# Patient Record
Sex: Female | Born: 1980 | Race: White | Hispanic: No | Marital: Married | Smoking: Never smoker
Health system: Southern US, Community
[De-identification: ages and names within clinical notes are randomized; demographics above are authoritative.]

## PROBLEM LIST (undated history)

## (undated) DIAGNOSIS — E282 Polycystic ovarian syndrome: Secondary | ICD-10-CM

## (undated) DIAGNOSIS — K219 Gastro-esophageal reflux disease without esophagitis: Secondary | ICD-10-CM

## (undated) DIAGNOSIS — T8859XA Other complications of anesthesia, initial encounter: Secondary | ICD-10-CM

## (undated) DIAGNOSIS — N200 Calculus of kidney: Secondary | ICD-10-CM

## (undated) DIAGNOSIS — T4145XA Adverse effect of unspecified anesthetic, initial encounter: Secondary | ICD-10-CM

## (undated) DIAGNOSIS — R000 Tachycardia, unspecified: Secondary | ICD-10-CM

## (undated) DIAGNOSIS — I4729 Other ventricular tachycardia: Secondary | ICD-10-CM

## (undated) HISTORY — PX: WISDOM TOOTH EXTRACTION: SHX21

## (undated) HISTORY — DX: Tachycardia, unspecified: R00.0

## (undated) HISTORY — DX: Calculus of kidney: N20.0

## (undated) HISTORY — DX: Polycystic ovarian syndrome: E28.2

---

## 2001-02-14 ENCOUNTER — Other Ambulatory Visit: Admission: RE | Admit: 2001-02-14 | Discharge: 2001-02-14 | Payer: Self-pay | Admitting: Family Medicine

## 2005-12-03 ENCOUNTER — Ambulatory Visit: Payer: Self-pay | Admitting: Gynecology

## 2005-12-27 ENCOUNTER — Ambulatory Visit: Payer: Self-pay | Admitting: Cardiovascular Disease

## 2005-12-27 ENCOUNTER — Ambulatory Visit (HOSPITAL_COMMUNITY): Admission: RE | Admit: 2005-12-27 | Discharge: 2005-12-27 | Payer: Self-pay | Admitting: Cardiovascular Disease

## 2006-01-04 ENCOUNTER — Ambulatory Visit: Payer: Self-pay | Admitting: Internal Medicine

## 2006-01-21 ENCOUNTER — Ambulatory Visit: Payer: Self-pay | Admitting: Gynecology

## 2006-03-04 ENCOUNTER — Encounter (INDEPENDENT_AMBULATORY_CARE_PROVIDER_SITE_OTHER): Payer: Self-pay | Admitting: Gynecology

## 2006-03-04 ENCOUNTER — Ambulatory Visit: Payer: Self-pay | Admitting: Obstetrics & Gynecology

## 2006-03-04 ENCOUNTER — Ambulatory Visit: Payer: Self-pay | Admitting: Gynecology

## 2006-03-18 ENCOUNTER — Ambulatory Visit: Payer: Self-pay | Admitting: Gynecology

## 2006-04-26 ENCOUNTER — Ambulatory Visit: Payer: Self-pay | Admitting: Gynecology

## 2006-05-03 ENCOUNTER — Ambulatory Visit: Payer: Self-pay | Admitting: Gynecology

## 2006-05-21 ENCOUNTER — Ambulatory Visit (HOSPITAL_COMMUNITY): Admission: RE | Admit: 2006-05-21 | Discharge: 2006-05-21 | Payer: Self-pay | Admitting: Gynecology

## 2006-05-30 ENCOUNTER — Ambulatory Visit: Payer: Self-pay | Admitting: *Deleted

## 2006-05-30 ENCOUNTER — Encounter: Payer: Self-pay | Admitting: Vascular Surgery

## 2006-05-30 ENCOUNTER — Inpatient Hospital Stay (HOSPITAL_COMMUNITY): Admission: AD | Admit: 2006-05-30 | Discharge: 2006-05-30 | Payer: Self-pay | Admitting: Obstetrics and Gynecology

## 2006-06-03 ENCOUNTER — Encounter (INDEPENDENT_AMBULATORY_CARE_PROVIDER_SITE_OTHER): Payer: Self-pay | Admitting: Gynecology

## 2006-06-03 ENCOUNTER — Ambulatory Visit: Payer: Self-pay | Admitting: Gynecology

## 2006-06-19 ENCOUNTER — Ambulatory Visit (HOSPITAL_COMMUNITY): Admission: RE | Admit: 2006-06-19 | Discharge: 2006-06-19 | Payer: Self-pay | Admitting: Gynecology

## 2006-06-19 ENCOUNTER — Ambulatory Visit: Payer: Self-pay | Admitting: Internal Medicine

## 2006-07-01 ENCOUNTER — Ambulatory Visit: Payer: Self-pay | Admitting: Gynecology

## 2006-07-11 ENCOUNTER — Ambulatory Visit (HOSPITAL_COMMUNITY): Admission: RE | Admit: 2006-07-11 | Discharge: 2006-07-11 | Payer: Self-pay | Admitting: Gynecology

## 2006-07-24 ENCOUNTER — Ambulatory Visit (HOSPITAL_COMMUNITY): Admission: RE | Admit: 2006-07-24 | Discharge: 2006-07-24 | Payer: Self-pay | Admitting: Gynecology

## 2006-07-29 ENCOUNTER — Ambulatory Visit: Payer: Self-pay | Admitting: Internal Medicine

## 2006-07-29 ENCOUNTER — Ambulatory Visit: Payer: Self-pay | Admitting: Gynecology

## 2006-08-12 ENCOUNTER — Ambulatory Visit: Payer: Self-pay | Admitting: Gynecology

## 2006-08-21 ENCOUNTER — Ambulatory Visit (HOSPITAL_COMMUNITY): Admission: RE | Admit: 2006-08-21 | Discharge: 2006-08-21 | Payer: Self-pay | Admitting: Gynecology

## 2006-09-09 ENCOUNTER — Ambulatory Visit: Payer: Self-pay | Admitting: Gynecology

## 2006-09-25 ENCOUNTER — Ambulatory Visit (HOSPITAL_COMMUNITY): Admission: RE | Admit: 2006-09-25 | Discharge: 2006-09-25 | Payer: Self-pay | Admitting: Gynecology

## 2006-09-30 ENCOUNTER — Ambulatory Visit: Payer: Self-pay | Admitting: Gynecology

## 2006-10-14 ENCOUNTER — Ambulatory Visit: Payer: Self-pay | Admitting: Gynecology

## 2006-10-24 ENCOUNTER — Ambulatory Visit (HOSPITAL_COMMUNITY): Admission: RE | Admit: 2006-10-24 | Discharge: 2006-10-24 | Payer: Self-pay | Admitting: Gynecology

## 2006-10-25 ENCOUNTER — Ambulatory Visit: Payer: Self-pay | Admitting: Internal Medicine

## 2006-10-28 ENCOUNTER — Ambulatory Visit: Payer: Self-pay | Admitting: Gynecology

## 2006-11-18 ENCOUNTER — Ambulatory Visit: Payer: Self-pay | Admitting: Gynecology

## 2006-11-20 ENCOUNTER — Ambulatory Visit: Payer: Self-pay | Admitting: Gynecology

## 2006-11-21 ENCOUNTER — Ambulatory Visit (HOSPITAL_COMMUNITY): Admission: RE | Admit: 2006-11-21 | Discharge: 2006-11-21 | Payer: Self-pay | Admitting: Gynecology

## 2006-11-25 ENCOUNTER — Ambulatory Visit: Payer: Self-pay | Admitting: Gynecology

## 2006-12-02 ENCOUNTER — Ambulatory Visit: Payer: Self-pay | Admitting: Obstetrics & Gynecology

## 2006-12-05 ENCOUNTER — Ambulatory Visit: Payer: Self-pay | Admitting: Obstetrics & Gynecology

## 2006-12-09 ENCOUNTER — Ambulatory Visit: Payer: Self-pay | Admitting: Gynecology

## 2006-12-12 ENCOUNTER — Ambulatory Visit (HOSPITAL_COMMUNITY): Admission: RE | Admit: 2006-12-12 | Discharge: 2006-12-12 | Payer: Self-pay | Admitting: Gynecology

## 2006-12-16 ENCOUNTER — Ambulatory Visit: Payer: Self-pay | Admitting: Gynecology

## 2006-12-19 ENCOUNTER — Ambulatory Visit: Payer: Self-pay | Admitting: Gynecology

## 2006-12-19 ENCOUNTER — Inpatient Hospital Stay (HOSPITAL_COMMUNITY): Admission: RE | Admit: 2006-12-19 | Discharge: 2006-12-22 | Payer: Self-pay | Admitting: Gynecology

## 2006-12-30 ENCOUNTER — Ambulatory Visit: Payer: Self-pay | Admitting: Gynecology

## 2007-01-27 ENCOUNTER — Ambulatory Visit: Payer: Self-pay | Admitting: Gynecology

## 2007-01-27 ENCOUNTER — Encounter (INDEPENDENT_AMBULATORY_CARE_PROVIDER_SITE_OTHER): Payer: Self-pay | Admitting: Gynecology

## 2008-04-27 ENCOUNTER — Encounter: Payer: Self-pay | Admitting: Family Medicine

## 2008-04-27 ENCOUNTER — Ambulatory Visit: Payer: Self-pay | Admitting: Family Medicine

## 2008-06-29 ENCOUNTER — Encounter: Payer: Self-pay | Admitting: Family Medicine

## 2008-06-29 ENCOUNTER — Ambulatory Visit: Payer: Self-pay | Admitting: Family Medicine

## 2009-03-21 ENCOUNTER — Ambulatory Visit: Payer: Self-pay | Admitting: Obstetrics and Gynecology

## 2009-04-14 ENCOUNTER — Ambulatory Visit: Payer: Self-pay | Admitting: Obstetrics and Gynecology

## 2009-04-14 ENCOUNTER — Ambulatory Visit (HOSPITAL_COMMUNITY): Admission: RE | Admit: 2009-04-14 | Discharge: 2009-04-14 | Payer: Self-pay | Admitting: Obstetrics and Gynecology

## 2009-04-14 LAB — CONVERTED CEMR LAB
GC Probe Amp, Genital: NEGATIVE
HCT: 41.9 % (ref 36.0–46.0)
MCHC: 31.5 g/dL (ref 30.0–36.0)
Platelets: 287 10*3/uL (ref 150–400)
RBC: 4.63 M/uL (ref 3.87–5.11)
Sed Rate: 19 mm/hr (ref 0–22)

## 2009-04-25 ENCOUNTER — Ambulatory Visit: Payer: Self-pay | Admitting: Obstetrics and Gynecology

## 2009-04-25 ENCOUNTER — Encounter: Payer: Self-pay | Admitting: Obstetrics and Gynecology

## 2009-04-28 ENCOUNTER — Ambulatory Visit: Payer: Self-pay | Admitting: Obstetrics and Gynecology

## 2009-06-24 ENCOUNTER — Ambulatory Visit (HOSPITAL_COMMUNITY): Admission: RE | Admit: 2009-06-24 | Discharge: 2009-06-24 | Payer: Self-pay | Admitting: Family Medicine

## 2009-09-26 ENCOUNTER — Ambulatory Visit: Payer: Self-pay | Admitting: Obstetrics and Gynecology

## 2009-09-26 ENCOUNTER — Inpatient Hospital Stay (HOSPITAL_COMMUNITY): Admission: AD | Admit: 2009-09-26 | Discharge: 2009-09-26 | Payer: Self-pay | Admitting: Obstetrics and Gynecology

## 2009-09-27 ENCOUNTER — Inpatient Hospital Stay (HOSPITAL_COMMUNITY): Admission: AD | Admit: 2009-09-27 | Discharge: 2009-09-27 | Payer: Self-pay | Admitting: Family Medicine

## 2010-05-16 ENCOUNTER — Ambulatory Visit: Payer: Self-pay | Admitting: Family Medicine

## 2010-06-06 ENCOUNTER — Ambulatory Visit: Payer: Self-pay | Admitting: Family Medicine

## 2010-06-26 ENCOUNTER — Ambulatory Visit: Admit: 2010-06-26 | Payer: Self-pay | Admitting: Obstetrics and Gynecology

## 2010-07-04 ENCOUNTER — Encounter: Payer: Self-pay | Admitting: Family Medicine

## 2010-07-04 ENCOUNTER — Ambulatory Visit
Admission: RE | Admit: 2010-07-04 | Discharge: 2010-07-04 | Payer: Self-pay | Source: Home / Self Care | Attending: Family Medicine | Admitting: Family Medicine

## 2010-07-04 LAB — CONVERTED CEMR LAB
AST: 15 units/L (ref 0–37)
Albumin: 4.6 g/dL (ref 3.5–5.2)
BUN: 11 mg/dL (ref 6–23)
Calcium: 9.1 mg/dL (ref 8.4–10.5)
Chloride: 100 meq/L (ref 96–112)
Creatinine, Ser: 0.68 mg/dL (ref 0.40–1.20)
Glucose, Bld: 72 mg/dL (ref 70–99)
RDW: 13.5 % (ref 11.5–15.5)
Total Bilirubin: 0.3 mg/dL (ref 0.3–1.2)
Total Protein: 7.3 g/dL (ref 6.0–8.3)
WBC: 4.7 10*3/uL (ref 4.0–10.5)

## 2010-09-06 LAB — URINALYSIS, ROUTINE W REFLEX MICROSCOPIC
Glucose, UA: 250 mg/dL — AB
Ketones, ur: 15 mg/dL — AB
Nitrite: POSITIVE — AB
Protein, ur: 100 mg/dL — AB
pH: 5 (ref 5.0–8.0)

## 2010-09-06 LAB — COMPREHENSIVE METABOLIC PANEL
AST: 18 U/L (ref 0–37)
Albumin: 4 g/dL (ref 3.5–5.2)
Alkaline Phosphatase: 51 U/L (ref 39–117)
BUN: 13 mg/dL (ref 6–23)
CO2: 26 mEq/L (ref 19–32)
Chloride: 104 mEq/L (ref 96–112)
GFR calc Af Amer: >60 mL/min (ref >60)
GFR calc non Af Amer: >60 mL/min (ref >60)
Potassium: 3.6 mEq/L (ref 3.5–5.1)

## 2010-09-06 LAB — URINE MICROSCOPIC-ADD ON

## 2010-09-06 LAB — URINE CULTURE: Colony Count: 30000

## 2010-09-06 LAB — CBC: MCHC: 34 g/dL (ref 30.0–36.0)

## 2010-10-02 ENCOUNTER — Other Ambulatory Visit: Payer: Self-pay | Admitting: Family Medicine

## 2010-10-02 ENCOUNTER — Other Ambulatory Visit: Payer: 59

## 2010-10-02 DIAGNOSIS — Z3480 Encounter for supervision of other normal pregnancy, unspecified trimester: Secondary | ICD-10-CM

## 2010-10-02 DIAGNOSIS — O3680X0 Pregnancy with inconclusive fetal viability, not applicable or unspecified: Secondary | ICD-10-CM

## 2010-10-02 LAB — HEPATITIS B SURFACE ANTIGEN: Hepatitis B Surface Ag: NEGATIVE

## 2010-10-02 LAB — ABO/RH: RH Type: POSITIVE

## 2010-10-02 LAB — RPR: RPR: NONREACTIVE

## 2010-10-06 ENCOUNTER — Ambulatory Visit (HOSPITAL_COMMUNITY)
Admission: RE | Admit: 2010-10-06 | Discharge: 2010-10-06 | Disposition: A | Discharge status: Home / Self Care | Payer: 59 | Source: Ambulatory Visit | Attending: Family Medicine | Admitting: Family Medicine

## 2010-10-06 ENCOUNTER — Ambulatory Visit (HOSPITAL_COMMUNITY): Payer: 59

## 2010-10-06 DIAGNOSIS — O3680X0 Pregnancy with inconclusive fetal viability, not applicable or unspecified: Secondary | ICD-10-CM

## 2010-10-06 DIAGNOSIS — Z3689 Encounter for other specified antenatal screening: Secondary | ICD-10-CM | POA: Insufficient documentation

## 2010-10-11 ENCOUNTER — Encounter: Payer: 59 | Admitting: Obstetrics & Gynecology

## 2010-10-11 DIAGNOSIS — Z3480 Encounter for supervision of other normal pregnancy, unspecified trimester: Secondary | ICD-10-CM

## 2010-10-19 ENCOUNTER — Encounter: Payer: 59 | Admitting: Family Medicine

## 2010-10-22 ENCOUNTER — Observation Stay (HOSPITAL_COMMUNITY)
Admission: AD | Admit: 2010-10-22 | Discharge: 2010-10-23 | Disposition: A | Discharge status: Home / Self Care | Payer: 59 | Source: Ambulatory Visit | Attending: Obstetrics & Gynecology | Admitting: Obstetrics & Gynecology

## 2010-10-22 ENCOUNTER — Inpatient Hospital Stay (HOSPITAL_COMMUNITY): Payer: 59

## 2010-10-22 DIAGNOSIS — O9989 Other specified diseases and conditions complicating pregnancy, childbirth and the puerperium: Secondary | ICD-10-CM

## 2010-10-22 DIAGNOSIS — N200 Calculus of kidney: Secondary | ICD-10-CM

## 2010-10-22 DIAGNOSIS — O99891 Other specified diseases and conditions complicating pregnancy: Principal | ICD-10-CM | POA: Insufficient documentation

## 2010-10-22 DIAGNOSIS — R1032 Left lower quadrant pain: Secondary | ICD-10-CM

## 2010-10-22 LAB — COMPREHENSIVE METABOLIC PANEL
ALT: 10 U/L (ref 0–35)
AST: 13 U/L (ref 0–37)
Albumin: 3.5 g/dL (ref 3.5–5.2)
CO2: 23 mEq/L (ref 19–32)
Calcium: 9.3 mg/dL (ref 8.4–10.5)
Creatinine, Ser: 0.69 mg/dL (ref 0.4–1.2)
GFR calc Af Amer: >60 mL/min (ref >60)
Sodium: 130 mEq/L — ABNORMAL LOW (ref 135–145)

## 2010-10-22 LAB — URINALYSIS, ROUTINE W REFLEX MICROSCOPIC
Bilirubin Urine: NEGATIVE
Glucose, UA: NEGATIVE mg/dL
Ketones, ur: NEGATIVE mg/dL
Nitrite: NEGATIVE
Specific Gravity, Urine: 1.02 (ref 1.005–1.030)
pH: 7 (ref 5.0–8.0)

## 2010-10-22 LAB — CBC
Hemoglobin: 13.4 g/dL (ref 12.0–15.0)
MCH: 29.9 pg (ref 26.0–34.0)
MCHC: 34.4 g/dL (ref 30.0–36.0)
Platelets: 278 10*3/uL (ref 150–400)
RBC: 4.48 MIL/uL (ref 3.87–5.11)

## 2010-10-22 LAB — URINE MICROSCOPIC-ADD ON

## 2010-10-23 LAB — URINE CULTURE
Colony Count: 55000
Culture  Setup Time: 201205061659

## 2010-10-27 LAB — STONE ANALYSIS: Stone Weight KSTONE: 0.023 g

## 2010-10-29 ENCOUNTER — Inpatient Hospital Stay (HOSPITAL_COMMUNITY)
Admission: AD | Admit: 2010-10-29 | Discharge: 2010-10-29 | Disposition: A | Discharge status: Home / Self Care | Payer: 59 | Source: Ambulatory Visit | Attending: Obstetrics & Gynecology | Admitting: Obstetrics & Gynecology

## 2010-10-29 DIAGNOSIS — M545 Low back pain, unspecified: Secondary | ICD-10-CM | POA: Insufficient documentation

## 2010-10-29 DIAGNOSIS — O99891 Other specified diseases and conditions complicating pregnancy: Secondary | ICD-10-CM | POA: Insufficient documentation

## 2010-10-29 LAB — URINALYSIS, ROUTINE W REFLEX MICROSCOPIC
Bilirubin Urine: NEGATIVE
Hgb urine dipstick: NEGATIVE
Ketones, ur: NEGATIVE mg/dL
Nitrite: NEGATIVE
Protein, ur: NEGATIVE mg/dL
Urobilinogen, UA: 0.2 mg/dL (ref 0.0–1.0)

## 2010-10-31 NOTE — Assessment & Plan Note (Signed)
NAME:  Tanya Meza, Tanya Meza               ACCOUNT NO.:  [id number]   MEDICAL RECORD NO.:  [id number]          PATIENT TYPE:  POB   LOCATION:  [facility] at [facility]         FACILITY:  [facility]   PHYSICIAN:  Argentina Donovan, MD        DATE OF BIRTH:  1980/08/27   DATE OF SERVICE:  04/25/2009                                  CLINIC NOTE   The patient is in for annual examination.  She was in last, April 15, 2009, with early case of pelvic inflammatory disease, which responded  very well to antibiotic therapy.  She has a history of polycystic  ovarian syndrome.  She did want to conceive the last time.  Ultrasound  that was done when she came in previously showed a slight area that may  have been a focus of hemorrhage in the one ovary to rule out dermoid or  other etiology.  They wanted to follow up in 6-8 weeks.  We are going to  comply with that.   MEDICATIONS:  She is on no medication.  I am suggesting she takes  prenatal vitamins with folic acid.   REVIEW OF SYSTEMS:  She has no complaints.  Review of systems is  virtually negative with exception of irregular periods, although she had  1 spontaneous when she was in last time.   PHYSICAL EXAMINATION:  VITAL SIGNS:  Blood pressure 118/68, weight is  162, height is 5 feet and 5 inches tall, and pulse is 75 per minute.  GENERAL:  Well-developed and well-nourished white female, in no acute  distress.  HEENT:  Within normal limits.  NECK:  Supple.  Thyroid symmetrical.  Neck is erect.  LUNGS:  Clear to auscultation and percussion.  HEART:  No murmur.  Normal sinus rhythm.  ABDOMEN:  Soft, flat, and nontender.  No masses or organomegaly.  EXTERNAL GENITALIA:  Normal.  BUS within normal limits.  Vagina is clean  and well rugated.  Cervix is clean and parous.  Uterus is anterior with  normal size, shape, and consistency.  Adnexa could not be well outlined.  EXTREMITIES:  No edema.  No varicosities.  DTRs within normal limits.   IMPRESSION:  Normal  physical examination.           ______________________________  Argentina Donovan, MD     PR/MEDQ  D:  04/25/2009  T:  04/26/2009  Job:  [id number]

## 2010-10-31 NOTE — Assessment & Plan Note (Signed)
NAME:  Tanya Meza, Tanya Meza               ACCOUNT NO.:  [id number]   MEDICAL RECORD NO.:  [id number]          PATIENT TYPE:  POB   LOCATION:  [facility] at [facility]         FACILITY:  [facility]   PHYSICIAN:  Tinnie Gens, MD        DATE OF BIRTH:  1980-12-30   DATE OF SERVICE:  07/04/2010                                  CLINIC NOTE   CHIEF COMPLAINT:  Yearly exam with Pap smear.   HISTORY OF PRESENT ILLNESS:  The patient is a 30 year old gravida 2,  para 2 who comes in today for yearly exam.  She is without significant  complaint today.  She has had PCOS.  She has regular cycles.  She got  pregnant her two previous times on Clomid and got pregnant fairly easily  with this.  She has history of preeclampsia with her first pregnancy and  her last pregnancy was uncomplicated.  She gets right ventricular  outflow tract tachycardia which she was pregnant but she has not  required medication for this a while.   PAST MEDICAL HISTORY:  Significant for PCOS and nephrolithiasis.   PAST SURGICAL HISTORY:  C-section x2, wisdom tooth extraction.   MEDICATIONS:  None.   ALLERGIES:  None known.   OBSTETRICAL HISTORY:  G2, P2 one C-section for preeclampsia and one was  an elective repeat.   GYN HISTORY:  The patient is current on nothing for birth control.  She  has no history of abnormal Pap smear.   FAMILY HISTORY:  Coronary artery disease, diabetes, thyroid disorder,  and breast cancer in maternal great aunt.  Her father has skin cancer  that is not melanoma.   SOCIAL HISTORY:  She lives with her husband and 2 children.  She works  for [employer].  She denies tobacco, alcohol, or drug use.   PHYSICAL EXAMINATION:  VITAL SIGNS:  On exam, her vitals are as noted in  the chart.  GENERAL:  She is a well-developed, well-nourished female in no acute  distress.  HEENT:  Normocephalic and atraumatic.  Sclerae anicteric.  NECK:  Supple.  Normal thyroid.  LUNGS:  Clear bilaterally.  CV:  Regular  rate and rhythm.  No rubs, gallops, or murmurs.  ABDOMEN:  Soft, nontender, and nondistended.  BREASTS:  Symmetric with everted nipples.  No masses.  No  supraclavicular or axillary adenopathy.  GU:  Normal external female genitalia.  BUS normal.  Vagina is pink and  rugated.  Cervix is nulliparous with a small polyp noted in the internal  os.  The uterus is small and in the midline.  There is no significant  tenderness.  No adnexal mass or tenderness.  EXTREMITIES:  No cyanosis, clubbing, or edema.  Distal pulses 2+.   IMPRESSION:  1. GYN exam with Pap.  2. History of polycystic ovarian syndrome.  3. Cervical polyp.  4. History of nephrolithiasis.  5. History of right ventricular outflow tract tachycardia.   PLAN:  1. Pap smear today.  2. I offered flu shot which the patient declined.  3. Blood work today to include lipid panel, CBC, CMP, and TSH.  4. I have given the patient prescription for Clomid  as she is      considering getting pregnant this year.  She will return as needed      or with pregnancy.           ______________________________  Tinnie Gens, MD     TP/MEDQ  D:  07/04/2010  T:  07/05/2010  Job:  [id number]

## 2010-10-31 NOTE — Assessment & Plan Note (Signed)
NAME:  Tanya Meza, Tanya Meza               ACCOUNT NO.:  [id number]   MEDICAL RECORD NO.:  [id number]          PATIENT TYPE:  POB   LOCATION:  [facility] at [facility]         FACILITY:  [facility]   PHYSICIAN:  Argentina Donovan, MD        DATE OF BIRTH:  24-Dec-1980   DATE OF SERVICE:  04/14/2009                                  CLINIC NOTE   The patient is a 30 year old, Caucasian female, gravida 2, para 2-0-0-2  with a history of 2 cesarean sections, youngest baby is 38 years old and  she has always had very irregular periods, had been treated with Clomid  in order to achieve pregnancy in the past, uses no birth control on a  regular basis, although she says she has been careful using condoms most  of the time.  However, she has had unprotected sex within the last 6  weeks and her last menstrual period up until the present one was about 2  months ago.  Yesterday, she began having a period and after the period  began, she began having some crampy pains followed by sharp stabbing  pains and then unrelenting severe sharp lower abdominal pain that would  have been then come back much stronger, prevented her from sleeping.  Her belly seemed extremely tender to touch.  She began having some  nausea after that, but this denied any sign of fever.   On examination, her abdomen is soft, flat, tender to deep palpation, but  no significant rebound and no guarding.  There is no CVA tenderness.  The external genitalia is normal.  BUS within normal limits.  The vagina  was clean, well rugated, and a moderate amount of burgundy blood in the  vault which she says, but the amount of bleeding is similar to most  periods that she has.  On bimanual pelvic examination, the cervix is  extremely tender.  The patient has this slightly less than her  chandelier sign and the adnexa could not be well outlined, although I  got a feeling that there was some fullness on the left side.   The plan is although a urine pregnancy test was  negative.  I am going to  get a quantitative beta-serum test, a CBC, and erythrocyte sedimentation  rate, and the ultrasound of the pelvis.  I think that this patient  probably has my impression is pelvic pain probably secondary to  salpingitis and pelvic inflammatory disease based on the fact that the  pain started after the onset of the period.  She had at least one  episode of ruptured ovarian cyst in the past that was in cycle when she  was in her period and since classically the cyst rupturing will cause  pain prior to the onset of the bleeding.  I think that it is unlikely  that ovarian cyst is the cause of this ruptured ovarian cyst as a cause  of the pain that the patient currently has.  I am going to start her on  doxycycline and give her some Percocet for pain and given appointment to  come back in a week or sooner if the pain gets worse.  Also,  the patient  had instructions to contact me after we get the results of the lab tests  and the ultrasound later today.   IMPRESSION:  Pelvic pain probably pelvic inflammatory disease.           ______________________________  Argentina Donovan, MD     PR/MEDQ  D:  04/14/2009  T:  04/15/2009  Job:  [id number]

## 2010-10-31 NOTE — Op Note (Signed)
NAME:  Tanya Meza, Tanya Meza               ACCOUNT NO.:  [id number]   MEDICAL RECORD NO.:  [id number]          PATIENT TYPE:  INP   LOCATION:  9103                          FACILITY:  [facility]   PHYSICIAN:  Ginger Carne, MD  DATE OF BIRTH:  27-Jul-1980   DATE OF PROCEDURE:  12/19/2006  DATE OF DISCHARGE:                               OPERATIVE REPORT   PREOPERATIVE DIAGNOSIS:  1. Repeat cesarean section, 39 and 0/7 weeks' gestation.  2. History of chronic hypertension.   POSTOPERATIVE DIAGNOSIS:  1. Repeat cesarean section, 39 and 0/7 weeks' gestation.  2. History of chronic hypertension.  3. Term viable delivery of female infant.   PROCEDURE:  Repeat low-transverse cesarean section   SURGEON:  Ginger Carne, MD   ASSISTANT:  None.   COMPLICATIONS:  None immediate.   ESTIMATED BLOOD LOSS:  500 mL.   SPECIMENS:  Cord bloods.   ANESTHESIA:  Spinal.   OPERATIVE FINDINGS:  Term infant, female, delivered in a vertex  presentation.  Apgars and weight her delivery room record.  No gross  abnormalities.  Baby cried spontaneously at delivery and amniotic fluid  was clear.  A 3- vessel cord.  Central insertion.  Complete placenta.  Uterus, tubes, and ovary showed normal his decidual changes of  pregnancy.   OPERATIVE PROCEDURE:  The patient prepped and draped in the usual  fashion and placed in the left supine position.  Betadine soap was used  for antiseptic in the patient was characterized prior to procedure.  After adequate spinal analgesia, a Pfannenstiel incision was made in the  abdomen open.  The lower uterine segment was in size transversely.  They  be delivered; cord clamped and cut; and the infant given to the  pediatric staff after bulb suctioning.  Placenta removed manually.  Uterus was inspected closure of the uterine musculature in one layer of  #0  Vicryl running interlocking sutures.  He missed ethically checked, blood  clots removed, closure of the fascia from  either in two midline was with  #0 Vicryl suture and 4-0 Prolene for subcuticular closure with Steri-  Strips.   The patient tolerated the procedure well and went to the post anesthesia  recovery room in excellent condition.      Ginger Carne, MD  Electronically Signed     SHB/MEDQ  D:  12/19/2006  T:  12/19/2006  Job:  [id number]

## 2010-10-31 NOTE — Assessment & Plan Note (Signed)
NAME:  Tanya Meza, Tanya Meza               ACCOUNT NO.:  [id number]   MEDICAL RECORD NO.:  [id number]          PATIENT TYPE:  POB   LOCATION:  [facility] at [facility]         FACILITY:  [facility]   PHYSICIAN:  Tinnie Gens, MD        DATE OF BIRTH:  [birth year]   DATE OF SERVICE:  06/29/2008                                  CLINIC NOTE   CHIEF COMPLAINT:  Cervical biopsy.   HISTORY OF PRESENT ILLNESS:  The patient is a 30 year old gravida 2,  para 2, has had two prior C-sections, who was seen for a yearly exam in  November.  At that time she was found to have a cervical polyp which was  deemed benign but could not be removed easily with the ring forceps, so  she is here for a biopsy just to prove that it is a benign polyp.   EXAMINATION:  On exam vitals are as noted in the chart.  She is a well-  developed, well-nourished female no acute stress.  GU:  Normal external  female genitalia.  BUS normal.  Vagina is pink and rugated.  Cervix is  nulliparous without lesions.  There is a fleshy growth in the midline of  the cervix.  Attempt was made to remove this with a ring forceps again.  However, this could not be done.   PROCEDURE:  Cervical biopsy is obtained without difficulty, bleeding.  Hemostasis was achieved with Monsel's.   IMPRESSION:  Cervical polyp most likely.  Unable to be removed, but  biopsy taken.   PLAN:  Await pathology results.  Call the patient if something is  abnormal.  Otherwise we will send her a letter stating everything is  fine.  She can follow up with a Pap smear in November of 2010.           ______________________________  Tinnie Gens, MD     TP/MEDQ  D:  06/29/2008  T:  06/29/2008  Job:  [id number]

## 2010-10-31 NOTE — Assessment & Plan Note (Signed)
NAME:  Tanya Meza, Tanya Meza               ACCOUNT NO.:  [id number]   MEDICAL RECORD NO.:  [id number]          PATIENT TYPE:  POB   LOCATION:  [facility] at [facility]         [facility]ACILITY:  [facility]   PHYSICIAN:  Tinnie Gens, MD        DATE OF BIRTH:  [birth year]   DATE OF SERVICE:  04/27/2008                                  CLINIC NOTE   CHIEF COMPLAINT:  Yearly exam.   HISTORY OF PRESENT ILLNESS:  The patient is a 30 year old gravida 2,  para 2 who has had two prior sections for preeclampsia and has  hypertension during pregnancy.  She had difficulty getting pregnant and  has to use Clomid, but has gotten pregnant easily with this.  She has a  history OF PCOS and irregular cycles.  She has had approximately three  this year.  The patient also has right ventricular outflow tract  tachycardia which has been fairly well controlled.   PAST MEDICAL HISTORY:  Significant for hypertension with pregnancy and  PCOS.   PAST SURGICAL HISTORY:  C-section x2.   MEDICATIONS:  She is on none currently.   ALLERGIES:  None known.   FAMILY HISTORY:  Coronary artery disease, diabetes, thyroid disorder,  and breast cancer in a maternal aunt.   SOCIAL HISTORY:  She lives with her husband, two children.  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   OBSTETRICAL HISTORY:  G2, P2, 1 C-section posterior section for  eclampsia, one elective repeat.   GYNECOLOGIC HISTORY:  The patient is currently on nothing for birth  control.  No significant history of abnormal Pap.   PHYSICAL EXAMINATION:  VITALS:  As noted in the chart.  GENERAL:  She is a well-developed, well-nourished female, in no acute  distress.  HEENT:  Normocephalic, atraumatic.  Sclerae anicteric.  NECK:  Supple.  Normal thyroid.  LUNGS:  Clear bilaterally.  CARDIOVASCULAR:  Regular rate and rhythm without rubs, gallops, or  murmurs.  ABDOMEN:  Soft, nontender, nondistended.  BREASTS:  Symmetric with everted nipples.  No masses.   No  supraclavicular or axillary adenopathy.  GU:  Normal external female genitalia.  BUS normal.  Vagina is pink and  rugated.  Cervix is nulliparous.  There is a small polyp noted at the  internal os.  Uterus is small maybe somewhat fixed in the midline.  No  significant tenderness.  No adnexal mass or tenderness.  EXTREMITIES:  No cyanosis, clubbing, or edema.   IMPRESSION:  1. Yearly exam.  2. Cervical polyp.   PLAN:  1. Pap smear today.  2. Follow up in 2-4 weeks for cervical biopsy.  3. Yaz for birth control.           ______________________________  Tinnie Gens, MD     TP/MEDQ  D:  04/27/2008  T:  04/28/2008  Job:  [id number]

## 2010-10-31 NOTE — Discharge Summary (Signed)
NAME:  Tanya Meza, Tanya Meza               ACCOUNT NO.:  [id number]   MEDICAL RECORD NO.:  [id number]          PATIENT TYPE:  POB   LOCATION:  [facility]                          FACILITY:  [facility]   PHYSICIAN:  Ginger Carne, MD  DATE OF BIRTH:  1980-08-04   DATE OF ADMISSION:  12/19/2006  DATE OF DISCHARGE:  12/22/2006                               DISCHARGE SUMMARY   DISCHARGE DIAGNOSES:  1. Term pregnancy, delivered.  2. Chronic hypertension.  3. Low-transverse cesarean section secondary to repeat.  4. Acute blood loss anemia.   DISCHARGE MEDICATIONS:  1. Yaz one tab daily.  2. Colace twice daily.  3. Iron 325 twice daily.  4. Percocet 5/325 q.4-6 hours p.r.n.  5. Ibuprofen 600 mg t.i.d.   PROCEDURES:  1. Low-transverse C-section secondary to repeat on December 19, 2006.  2. Ultrasound.  3. Nonstress test.   HOSPITAL COURSE:  A 30 year old G2 P1-1-0-2 at 6 0/7 weeks was admitted  for scheduled repeat low-transverse C-section.  She also had a history  of chronic hypertension.  There were no complications postop.  Estimated  blood loss less than 500 mL.  See op note for more details.  Produced a  viable healthy female.  Patient did have some mild anemia that was  asymptomatic with a hemoglobin down to 7.8 from 9.47.   FOLLOWUP:  The patient will follow up at [facility] in one week for  blood pressure check and suture removal.   DIET:  Routine.   INSTRUCTIONS:  Routine.   STATUS __________:  Patient will be discharged to home.  She will also  follow up at [facility] for a six-week followup in six weeks.      Angeline Slim, M.D.  Electronically Signed      Ginger Carne, MD  Electronically Signed    AL/MEDQ  D:  12/22/2006  T:  12/22/2006  Job:  [id number]

## 2010-10-31 NOTE — Assessment & Plan Note (Signed)
[facility]                         ELECTROPHYSIOLOGY OFFICE NOTE   Tanya Meza, Tanya Meza                      MRN:          [id number]  DATE:10/25/2006                            DOB:          28-Feb-1981    Tanya Meza returns today for followup.  She is a very pleasant 30-year-  old woman who is 51 weeks' pregnant, who was initially referred early in  her first trimester for evaluation of nonsustained VT.  The patient has  had a previous history of palpitations in the past and her additional  past medical history is notable for history of eclampsia/preeclampsia  with her initial pregnancy, requiring STAT C-section.  Since she began  on the beta blocker, she has had essentially no palpitations.  She  denies chest pain or shortness of breath.  She is being monitored very  closely and is having monthly ultrasounds.  The patient denies other  symptoms today and overall feels well.   PHYSICAL EXAMINATION:  She is a pleasant young woman who is in her third  trimester of pregnancy, but in no acute distress.  The blood pressure was 100/64.  The pulse was 72 and regular.  The  respirations were 18.  Weight was 186 pounds.  NECK:  No jugulovenous distention.  LUNGS:  Clear bilaterally to auscultation.  CARDIOVASCULAR:  Regular rate and rhythm with normal S1 and S2.  I do  not appreciate any murmurs.  EXTREMITIES:  Demonstrated trace peripheral edema bilaterally.   EKG:  Demonstrates sinus rhythm with normal axis and intervals.  There  were 3 PVCs noted.   IMPRESSION:  1. Nonsustained ventricular tachycardia and frequent premature      ventricular contractions.  2. Thirty-one-week pregnancy.  3. History of eclampsia/preeclampsia during previous pregnancy.   DISCUSSION:  At this point, Tanya Meza is doing quite well.  I have  asked that she decrease her dose of metoprolol when she gets to the 37-  or 38-week gestation period to minimize the risk of  fetal bradycardia.  I will plan to see the patient back on as-needed basis up until this  time.  Today we also counseled some about the possibility of having a  third pregnancy and I basically recommended a period of watchful waiting  before making decisions about this.     Doylene Canning. Ladona Ridgel, MD  Electronically Signed    GWT/MedQ  DD: 10/25/2006  DT: 10/26/2006  Job #: [id number]   cc:   Ginger Carne, MD  Richard A. Alanda Amass, M.D.

## 2010-11-02 ENCOUNTER — Encounter: Payer: 59 | Admitting: Obstetrics and Gynecology

## 2010-11-03 NOTE — Assessment & Plan Note (Signed)
[facility]                         ELECTROPHYSIOLOGY OFFICE NOTE   Tanya, Meza                      MRN:          [id number]  DATE:06/19/2006                            DOB:          05-26-1981    Ms. Tanya Meza returns today for a follow up visit.  She is a very  pleasant, 30 year old woman who was initially referred to me back in  July for nonsustained VT.  The patient had a left inferior axis  nonsustained VT which really was associated with palpitations, but no  frank syncope.  The patient underwent evaluation with an MRI which  demonstrated no fibrofatty infiltration and had an echo with preserved  LV function.  She notes that her EKG demonstrated of her nonsustained VT  from negative to positive between leads V3 and V5.  These were all  consistent with RV outflow tract and nonsustained VT.  Because the  patient was minimally symptomatic at that time, had no medical therapy  recommended; and was not thought to be a good candidate for catheter  ablation because of the paucity of her symptoms.  She has subsequently  become pregnant now and is at 13 weeks' gestation.  She notes that over  the last several weeks her palpitations have increased in frequency and  severity.  Despite this she notes that she still has not had any frank  syncope.  She states that she has not had palpitations every day, but on  days that she does it may happen 1-3 times per day.  Again, there has  been no frank syncope, but she is anxious by this.  She states that the  sensation is that of her heart flipping over in her chest.  She denies  chest pain or shortness of breath.   Of additional note, her past medical history is remarkable for toxemia  being present with her initial pregnancy resulting in a STAT C-section.   PHYSICAL EXAMINATION:  She is a pleasant, well-appearing, young woman in  no acute distress.  The blood pressure was 112/74, pulse 97 and regular,  respirations 18.  The weight was 161 pounds.  NECK:  Revealed no jugular venous distention.  There was no thyromegaly.  LUNGS:  Clear bilaterally.  CARDIOVASCULAR EXAM:  A regular rate and rhythm with normal S1-S2.  EXTREMITIES:  Demonstrated no clubbing, cyanosis, or edema.  Pulses were  2+ and symmetric.  NEUROLOGIC EXAM:  Nonfocal.   IMPRESSION:  1. Nonsustained ventricular tachycardia with frequent premature      ventricular contractions.  2. A 13-week gestation.  3. History of eclampsia/preeclampsia with initial gestation requiring      STAT C-section.   DISCUSSION:  The patient was counseled in detail about the likely  continued benign nature of her palpitations.  However, because they are  becoming more symptomatic, I have recommended that she be given a  prescription for Lopressor 25 mg twice a day.  If her palpitations  increase in frequency and severity, then she is instructed to start  taking this medication on a regular basis. If her blood pressure were to  gradually  increase over the next several weeks, then I would also  recommend initiation of Lopressor.  We will plan to see her  back in the office in 4-6 weeks.  She is instructed to call if her  palpitations are not treatable with beta blockers.     Doylene Canning. Ladona Ridgel, MD  Electronically Signed    GWT/MedQ  DD: 06/19/2006  DT: 06/19/2006  Job #: [id number]   cc:   Gerlene Burdock A. Alanda Amass, M.D.  Ginger Carne, MD

## 2010-11-03 NOTE — Letter (Signed)
January 04, 2006     Richard A. Alanda Amass, MD  [address], [address]  [place], [place] [address]   RE:  TRAVIS, PURK  MRN:  [id number]  /  DOB:  06-Aug-1980   Dear Luan Pulling:   Thank you for referring Mrs. Norman Clay for EP evaluation. As you know,  she is a very pleasant 30 year old woman who is otherwise healthy, but has  had documented tachy-palpitations for over 10 years. These have been  subsequently found to be due to frequent PVC's and non-sustained VT. 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 The patient has recently undergone a cardiac  MRI and has had a 2-D echocardiogram in the past, demonstrating no clear cut  RV abnormalities, including normal RV systolic function with no focal  aneurysms or segmental wall motion abnormalities. In addition, her MRI  demonstrated no evidence of fiber or fatty infiltration of the right  ventricle. She was referred today for additional evaluation. The patient has  never had syncope. She denies chest pain and is minimally symptomatic with  her PVC's. Her examination was otherwise unremarkable with the exception of  an irregular heartbeat. Her EKG demonstrates sinus rhythm with a QRS  duration of 86 milliseconds, in the setting of non-sustained VT on the  cardiac monitor. Of note, the morphology as noted above, a left bundle  inferior axis rhythm, when she has her non-sustained VT and ventricular  ectopy.   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 As she is minimally symptomatic, I think a beta blocker or  calcium blocker at the present time would not be indicated. Though might be,  should she develop symptoms, particularly if she were to develop heart  failure symptoms from this, which I think is unlikely but not impossible.  Obviously, she had  syncope or prolonged symptomatic bouts of her tachycardia  and consideration for EP evaluation would be warranted. For now, I would  recommend a period of watchful waiting, perhaps with p.r.n. beta blockers,  if she developed worsening symptoms. Because of the minimal nature of her  symptoms for now and because of her young age, I think a period of watchful  waiting would be appropriate. I will plan to see her back in several months  to see how she is doing and if she is stable at that point, have her  followup with me on a p.r.n. basis.   Once again, thanks for referring Mrs. Nace for EP evaluation.    Sincerely,      Doylene Canning. Ladona Ridgel, MD   GWT/MedQ  DD:  01/04/2006  DT:  01/05/2006  Job #:  [id number]

## 2010-11-03 NOTE — Assessment & Plan Note (Signed)
[facility]                         ELECTROPHYSIOLOGY OFFICE NOTE   Tanya Meza, Tanya Meza                      MRN:          [id number]  DATE:07/29/2006                            DOB:          1980-07-24    Tanya Meza returns today for followup.  She is a very pleasant young  woman who is [redacted] weeks gestation, who has a history of RV outflow track  VT and palpitations from this.  I saw her back in early January when she  was at that time [redacted] weeks pregnant.  Because of increasing palpitations  we gave her a prescription for metoprolol 25 twice daily and she has  been on this for now 1-2 weeks.  The patient notes that about 3 or 4  days ago she began having cough with productive sputum and was recently  diagnosed with bronchitis.  She was given a prescription for Z-Pak.  The  patient returns today for followup.  Overall, she has been stable but  does note that for the last 3 to 4 days that she has had symptoms of  bronchitis with a productive yellow sputum and cough.  She denies any  high fevers or chills.  She has had no syncope.  She denies shortness of  breath or other symptoms.  She denies peripheral edema.   PHYSICAL EXAMINATION:  On exam, she is a pleasant well-appearing young  lady in no distress.  Blood pressure was about 100/60, the pulse was 80  and regular, the respirations were 18, the weight was 165 pounds, up 11  pounds from July of 2007, up 4 pounds from a month ago.  NECK:  No jugular venous distention.  There was no thyromegaly.  The  carotids are 2+ and symmetric.  LUNGS:  Clear bilaterally to auscultation.  There are no wheezes, rales  or rhonchi.  CARDIOVASCULAR:  A regular rate and rhythm with normal S1 and S2.  There  are no premature beats today.  EXTREMITIES:  No edema.   No EKG was done today.   MEDICATIONS:  1. Multivitamin.  2. Butal.  3. Z-Pak which was just started today.  4. Lopressor 25 twice daily.   DISCUSSION:   Overall, Tanya Meza is stable.  She has had increasing  palpitations since she has been sick with her bronchitis.  I have  recommended that she continue on with her Lopressor  25 twice daily.  I will see her back in the office in 2 to 3 months,  sooner if her palpitations worsen.  Overall, I expect a continued stable  course for this pregnancy.     Doylene Canning. Ladona Ridgel, MD  Electronically Signed    GWT/MedQ  DD: 07/29/2006  DT: 07/29/2006  Job #: [id number]   cc:   Ginger Carne, MD  Pearletha Furl. Alanda Amass, M.D.

## 2010-11-03 NOTE — Assessment & Plan Note (Signed)
[facility]                           ELECTROPHYSIOLOGY OFFICE NOTE   CHRYSA, RAMPY                      MRN:          [id number]  DATE:01/04/2006                            DOB:          06/02/1981    Tanya Meza is referred today by Dr. Susa Griffins for evaluation of  palpitations and documented nonsustained VT.   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  She  states that for at least 10 years she has had rare palpitations that are not  particularly bothersome to her but have been noted on doctors' followups and  EKGs.  These are notable for a left bundle branch block, QRS morphology with  positive R waves in the inferior leads consistent with RV outflow tract VT.  Of note, the transition of this tachycardia from negative to positive is  between V3 and V5 depending on lead position.  The patient states that she  has never had syncope and there is no family history of sudden cardiac  death.  She denies other significant symptoms.   PAST MEDICAL HISTORY:  Cesarean section in 2005.   FAMILY HISTORY:  Notable for no sudden or premature cardiac death.  She has  some grandparents with coronary disease and MIs.  Her parents are alive and  well, and she has no aunts, uncles or cousins who have died suddenly.   SOCIAL HISTORY:  The patient is married.  She works as a Armed forces operational officer.  She denies tobacco or ethanol use.   REVIEW OF SYSTEMS:  Negative except as noted in the HPI.  __________ .   PHYSICAL EXAMINATION:  GENERAL:  She is a pleasant, well-appearing young  woman in no acute distress.  VITAL SIGNS:  Blood pressure 124/84, pulse 78 and regular, respirations 18.  Weight 154  pounds.  HEENT:  Normocephalic, atraumatic.  Pupils equal and round.  Oropharynx  moist.  Sclerae anicteric.  NECK:  Revealed no jugular venous distention.  There is no thyromegaly.  Trachea was midline.  Carotids were 2+ and symmetric.  CARDIOVASCULAR:  There were no murmurs, rubs or gallops present. Regular  rate and rhythm with normal S1 and S2.  PMI was not laterally displaced nor  was it enlarged.  LUNGS:  Clear bilaterally to auscultation.  There were no wheezes, rales or  rhonchi.  There is no increased work of breathing.  ABDOMEN:  Soft, nontender, nondistended.  There was no organomegaly.  Bowel  sounds were present.  No rebound or guarding.  EXTREMITIES:  Demonstrated no cyanosis, clubbing or edema.  Pulses were 2+  and symmetric.   LABORATORY DATA:  The EKG demonstrates sinus rhythm with frequent PVCs.  These PVCs are in a left bundle branch block QRS morphology with positive R  waves in the inferior leads.   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   DISCUSSION:  Because the patient is not particularly symptomatic, I have  recommended a period of watchful waiting with regard to her arrhythmias.  While there have been reported cases of worsening LV function in patients  with frequent amounts of ventricular ectopy, I think this is quite unlikely  here.  Because her MRI is normal showing no focal abnormalities and no  evidence of fatty fibril infiltration, I think it is very unlikely that the  patient has RV dysplasia but simply has RV outflow tract VT.  Should she  develop syncope or have sustained tachy-palpitations, then I would be happy  to see her back and additional evaluation would be required, but at the  present time, electrophysiologic study and testing would not be indicated in  this particular patient.                                   Doylene Canning. Ladona Ridgel, MD   GWT/MedQ  DD:  01/04/2006  DT:   01/05/2006  Job #:  [id number]   cc:   Gerlene Burdock A. Alanda Amass, MD  Ginger Carne, MD

## 2010-11-08 ENCOUNTER — Encounter: Payer: 59 | Admitting: Family Medicine

## 2010-11-08 ENCOUNTER — Encounter (INDEPENDENT_AMBULATORY_CARE_PROVIDER_SITE_OTHER): Payer: 59 | Admitting: Family Medicine

## 2010-11-08 ENCOUNTER — Other Ambulatory Visit: Payer: Self-pay | Admitting: Family Medicine

## 2010-11-08 DIAGNOSIS — Z3682 Encounter for antenatal screening for nuchal translucency: Secondary | ICD-10-CM

## 2010-11-08 DIAGNOSIS — R1084 Generalized abdominal pain: Secondary | ICD-10-CM

## 2010-11-08 DIAGNOSIS — Z3480 Encounter for supervision of other normal pregnancy, unspecified trimester: Secondary | ICD-10-CM

## 2010-11-14 ENCOUNTER — Other Ambulatory Visit: Payer: Self-pay | Admitting: Obstetrics & Gynecology

## 2010-11-14 DIAGNOSIS — K829 Disease of gallbladder, unspecified: Secondary | ICD-10-CM

## 2010-11-16 ENCOUNTER — Ambulatory Visit (HOSPITAL_COMMUNITY)
Admission: RE | Admit: 2010-11-16 | Discharge: 2010-11-16 | Disposition: A | Discharge status: Home / Self Care | Payer: 59 | Source: Ambulatory Visit | Attending: Family Medicine | Admitting: Family Medicine

## 2010-11-16 ENCOUNTER — Ambulatory Visit (HOSPITAL_COMMUNITY)
Admission: RE | Admit: 2010-11-16 | Discharge: 2010-11-16 | Disposition: A | Discharge status: Home / Self Care | Payer: 59 | Source: Ambulatory Visit | Attending: Obstetrics & Gynecology | Admitting: Obstetrics & Gynecology

## 2010-11-16 DIAGNOSIS — O3510X0 Maternal care for (suspected) chromosomal abnormality in fetus, unspecified, not applicable or unspecified: Secondary | ICD-10-CM | POA: Insufficient documentation

## 2010-11-16 DIAGNOSIS — Z3682 Encounter for antenatal screening for nuchal translucency: Secondary | ICD-10-CM

## 2010-11-16 DIAGNOSIS — K829 Disease of gallbladder, unspecified: Secondary | ICD-10-CM

## 2010-11-16 DIAGNOSIS — O351XX0 Maternal care for (suspected) chromosomal abnormality in fetus, not applicable or unspecified: Secondary | ICD-10-CM | POA: Insufficient documentation

## 2010-11-16 DIAGNOSIS — Z3689 Encounter for other specified antenatal screening: Secondary | ICD-10-CM | POA: Insufficient documentation

## 2010-11-20 ENCOUNTER — Emergency Department (HOSPITAL_COMMUNITY)
Admission: EM | Admit: 2010-11-20 | Discharge: 2010-11-20 | Disposition: A | Discharge status: Home / Self Care | Payer: 59 | Attending: Emergency Medicine | Admitting: Emergency Medicine

## 2010-11-20 DIAGNOSIS — S060X0A Concussion without loss of consciousness, initial encounter: Secondary | ICD-10-CM | POA: Insufficient documentation

## 2010-11-20 DIAGNOSIS — O99891 Other specified diseases and conditions complicating pregnancy: Secondary | ICD-10-CM | POA: Insufficient documentation

## 2010-11-20 DIAGNOSIS — R110 Nausea: Secondary | ICD-10-CM | POA: Insufficient documentation

## 2010-11-20 DIAGNOSIS — R51 Headache: Secondary | ICD-10-CM | POA: Insufficient documentation

## 2010-11-20 DIAGNOSIS — IMO0002 Reserved for concepts with insufficient information to code with codable children: Secondary | ICD-10-CM | POA: Insufficient documentation

## 2010-12-06 ENCOUNTER — Encounter (INDEPENDENT_AMBULATORY_CARE_PROVIDER_SITE_OTHER): Payer: 59 | Admitting: Family Medicine

## 2010-12-06 ENCOUNTER — Other Ambulatory Visit: Payer: Self-pay | Admitting: Family Medicine

## 2010-12-06 DIAGNOSIS — Z3689 Encounter for other specified antenatal screening: Secondary | ICD-10-CM

## 2010-12-06 DIAGNOSIS — Z3480 Encounter for supervision of other normal pregnancy, unspecified trimester: Secondary | ICD-10-CM

## 2010-12-21 ENCOUNTER — Ambulatory Visit (HOSPITAL_COMMUNITY)
Admission: RE | Admit: 2010-12-21 | Discharge: 2010-12-21 | Disposition: A | Discharge status: Home / Self Care | Payer: 59 | Source: Ambulatory Visit | Attending: Family Medicine | Admitting: Family Medicine

## 2010-12-21 ENCOUNTER — Encounter (HOSPITAL_COMMUNITY): Payer: Self-pay

## 2010-12-21 DIAGNOSIS — Z363 Encounter for antenatal screening for malformations: Secondary | ICD-10-CM | POA: Insufficient documentation

## 2010-12-21 DIAGNOSIS — Z3689 Encounter for other specified antenatal screening: Secondary | ICD-10-CM

## 2010-12-21 DIAGNOSIS — Z1389 Encounter for screening for other disorder: Secondary | ICD-10-CM | POA: Insufficient documentation

## 2010-12-21 DIAGNOSIS — O09299 Supervision of pregnancy with other poor reproductive or obstetric history, unspecified trimester: Secondary | ICD-10-CM | POA: Insufficient documentation

## 2010-12-21 DIAGNOSIS — O358XX0 Maternal care for other (suspected) fetal abnormality and damage, not applicable or unspecified: Secondary | ICD-10-CM | POA: Insufficient documentation

## 2011-01-04 ENCOUNTER — Encounter (INDEPENDENT_AMBULATORY_CARE_PROVIDER_SITE_OTHER): Payer: 59 | Admitting: Obstetrics & Gynecology

## 2011-01-04 DIAGNOSIS — O34219 Maternal care for unspecified type scar from previous cesarean delivery: Secondary | ICD-10-CM

## 2011-01-04 DIAGNOSIS — Z3480 Encounter for supervision of other normal pregnancy, unspecified trimester: Secondary | ICD-10-CM

## 2011-02-01 ENCOUNTER — Encounter: Payer: 59 | Admitting: Obstetrics & Gynecology

## 2011-02-27 ENCOUNTER — Ambulatory Visit (INDEPENDENT_AMBULATORY_CARE_PROVIDER_SITE_OTHER): Payer: 59 | Admitting: Family Medicine

## 2011-02-27 DIAGNOSIS — Z3480 Encounter for supervision of other normal pregnancy, unspecified trimester: Secondary | ICD-10-CM

## 2011-02-28 LAB — CBC WITH DIFFERENTIAL/PLATELET
Basophils Absolute: 0 10*3/uL (ref 0.0–0.1)
Eosinophils Absolute: 0.1 10*3/uL (ref 0.0–0.7)
Eosinophils Relative: 2 % (ref 0–5)
HCT: 33.7 % — ABNORMAL LOW (ref 36.0–46.0)
Lymphocytes Relative: 17 % (ref 12–46)
MCH: 29.3 pg (ref 26.0–34.0)
MCHC: 32 g/dL (ref 30.0–36.0)
MCV: 91.3 fL (ref 78.0–100.0)
Monocytes Absolute: 0.3 10*3/uL (ref 0.1–1.0)
RDW: 13.6 % (ref 11.5–15.5)
WBC: 7.8 10*3/uL (ref 4.0–10.5)

## 2011-03-13 ENCOUNTER — Encounter (INDEPENDENT_AMBULATORY_CARE_PROVIDER_SITE_OTHER): Payer: 59 | Admitting: Family Medicine

## 2011-03-13 DIAGNOSIS — Z3480 Encounter for supervision of other normal pregnancy, unspecified trimester: Secondary | ICD-10-CM

## 2011-03-13 DIAGNOSIS — O34219 Maternal care for unspecified type scar from previous cesarean delivery: Secondary | ICD-10-CM

## 2011-03-29 ENCOUNTER — Encounter: Payer: Self-pay | Admitting: Family Medicine

## 2011-03-29 ENCOUNTER — Ambulatory Visit (INDEPENDENT_AMBULATORY_CARE_PROVIDER_SITE_OTHER): Payer: 59 | Admitting: Family Medicine

## 2011-03-29 ENCOUNTER — Other Ambulatory Visit: Payer: Self-pay | Admitting: Family Medicine

## 2011-03-29 DIAGNOSIS — O34219 Maternal care for unspecified type scar from previous cesarean delivery: Secondary | ICD-10-CM

## 2011-03-29 DIAGNOSIS — Z3480 Encounter for supervision of other normal pregnancy, unspecified trimester: Secondary | ICD-10-CM

## 2011-03-30 NOTE — Progress Notes (Signed)
S<D--for U/S

## 2011-04-02 ENCOUNTER — Ambulatory Visit (HOSPITAL_COMMUNITY)
Admission: RE | Admit: 2011-04-02 | Discharge: 2011-04-02 | Disposition: A | Discharge status: Home / Self Care | Payer: 59 | Source: Ambulatory Visit | Attending: Family Medicine | Admitting: Family Medicine

## 2011-04-02 ENCOUNTER — Ambulatory Visit (HOSPITAL_COMMUNITY): Payer: 59

## 2011-04-02 DIAGNOSIS — O358XX0 Maternal care for other (suspected) fetal abnormality and damage, not applicable or unspecified: Secondary | ICD-10-CM | POA: Insufficient documentation

## 2011-04-02 DIAGNOSIS — O365990 Maternal care for other known or suspected poor fetal growth, unspecified trimester, not applicable or unspecified: Secondary | ICD-10-CM | POA: Insufficient documentation

## 2011-04-02 DIAGNOSIS — O09299 Supervision of pregnancy with other poor reproductive or obstetric history, unspecified trimester: Secondary | ICD-10-CM | POA: Insufficient documentation

## 2011-04-03 LAB — BASIC METABOLIC PANEL
Calcium: 8.8
GFR calc non Af Amer: >60
Potassium: 3.9
Sodium: 133 — ABNORMAL LOW

## 2011-04-03 LAB — CBC
HCT: 28.4 — ABNORMAL LOW
Hemoglobin: 9.4 — ABNORMAL LOW
MCHC: 32.8
Platelets: 212
Platelets: 262
RBC: 3.03 — ABNORMAL LOW
WBC: 11.7 — ABNORMAL HIGH

## 2011-04-03 LAB — TYPE AND SCREEN
ABO/RH(D): A POS
Antibody Screen: NEGATIVE

## 2011-04-11 ENCOUNTER — Ambulatory Visit (INDEPENDENT_AMBULATORY_CARE_PROVIDER_SITE_OTHER): Payer: 59 | Admitting: Family Medicine

## 2011-04-11 ENCOUNTER — Encounter: Payer: Self-pay | Admitting: Family Medicine

## 2011-04-11 VITALS — BP 130/78 | Wt 175.0 lb

## 2011-04-11 DIAGNOSIS — O34219 Maternal care for unspecified type scar from previous cesarean delivery: Secondary | ICD-10-CM

## 2011-04-11 DIAGNOSIS — Z3480 Encounter for supervision of other normal pregnancy, unspecified trimester: Secondary | ICD-10-CM

## 2011-04-11 NOTE — Progress Notes (Signed)
U/S 10/11, vtx, nml AFI, 55%-Doing well.

## 2011-04-11 NOTE — Patient Instructions (Addendum)
Pregnancy - Third Trimester The third trimester of pregnancy (the last 3 months) is a period of the most rapid growth for you and your baby. The baby approaches a length of 20 inches and a weight of 6 to 10 pounds. The baby is adding on fat and getting ready for life outside your body. While inside, babies have periods of sleeping and waking, suck their thumbs, and hiccups. You can often feel small contractions of the uterus. This is false labor. It is also called Braxton-Hicks contractions. This is like a practice for labor. The usual problems in this stage of pregnancy include more difficulty breathing, swelling of the hands and feet from water retention, and having to urinate more often because of the uterus and baby pressing on your bladder.  PRENATAL EXAMS  Blood work may continue to be done during prenatal exams. These tests are done to check on your health and the probable health of your baby. Blood work is used to follow your blood levels (hemoglobin). Anemia (low hemoglobin) is common during pregnancy. Iron and vitamins are given to help prevent this. You may also continue to be checked for diabetes. Some of the past blood tests may be done again.   The size of the uterus is measured during each visit. This makes sure your baby is growing properly according to your pregnancy dates.   Your blood pressure is checked every prenatal visit. This is to make sure you are not getting toxemia.   Your urine is checked every prenatal visit for infection, diabetes and protein.   Your weight is checked at each visit. This is done to make sure gains are happening at the suggested rate and that you and your baby are growing normally.   Sometimes, an ultrasound is performed to confirm the position and the proper growth and development of the baby. This is a test done that bounces harmless sound waves off the baby so your caregiver can more accurately determine due dates.   Discuss the type of pain  medication and anesthesia you will have during your labor and delivery.   Discuss the possibility and anesthesia if a Cesarean Section might be necessary.   Inform your caregiver if there is any mental or physical violence at home.  Sometimes, a specialized non-stress test, contraction stress test and biophysical profile are done to make sure the baby is not having a problem. Checking the amniotic fluid surrounding the baby is called an amniocentesis. The amniotic fluid is removed by sticking a needle into the belly (abdomen). This is sometimes done near the end of pregnancy if an early delivery is required. In this case, it is done to help make sure the baby's lungs are mature enough for the baby to live outside of the womb. If the lungs are not mature and it is unsafe to deliver the baby, an injection of cortisone medication is given to the mother 1 to 2 days before the delivery. This helps the baby's lungs mature and makes it safer to deliver the baby. CHANGES OCCURING IN THE THIRD TRIMESTER OF PREGNANCY Your body goes through many changes during pregnancy. They vary from person to person. Talk to your caregiver about changes you notice and are concerned about.  During the last trimester, you have probably had an increase in your appetite. It is normal to have cravings for certain foods. This varies from person to person and pregnancy to pregnancy.   You may begin to get stretch marks on your hips,   abdomen, and breasts. These are normal changes in the body during pregnancy. There are no exercises or medications to take which prevent this change.   Constipation may be treated with a stool softener or adding bulk to your diet. Drinking lots of fluids, fiber in vegetables, fruits, and whole grains are helpful.   Exercising is also helpful. If you have been very active up until your pregnancy, most of these activities can be continued during your pregnancy. If you have been less active, it is helpful  to start an exercise program such as walking. Consult your caregiver before starting exercise programs.   Avoid all smoking, alcohol, un-prescribed drugs, herbs and "street drugs" during your pregnancy. These chemicals affect the formation and growth of the baby. Avoid chemicals throughout the pregnancy to ensure the delivery of a healthy infant.   Backache, varicose veins and hemorrhoids may develop or get worse.   You will tire more easily in the third trimester, which is normal.   The baby's movements may be stronger and more often.   You may become short of breath easily.   Your belly button may stick out.   A yellow discharge may leak from your breasts called colostrum.   You may have a bloody mucus discharge. This usually occurs a few days to a week before labor begins.  HOME CARE INSTRUCTIONS   Keep your caregiver's appointments. Follow your caregiver's instructions regarding medication use, exercise, and diet.   During pregnancy, you are providing food for you and your baby. Continue to eat regular, well-balanced meals. Choose foods such as meat, fish, milk and other low fat dairy products, vegetables, fruits, and whole-grain breads and cereals. Your caregiver will tell you of the ideal weight gain.   A physical sexual relationship may be continued throughout pregnancy if there are no other problems such as early (premature) leaking of amniotic fluid from the membranes, vaginal bleeding, or belly (abdominal) pain.   Exercise regularly if there are no restrictions. Check with your caregiver if you are unsure of the safety of your exercises. Greater weight gain will occur in the last 2 trimesters of pregnancy. Exercising helps:   Control your weight.   Get you in shape for labor and delivery.   You lose weight after you deliver.   Rest a lot with legs elevated, or as needed for leg cramps or low back pain.   Wear a good support or jogging bra for breast tenderness during  pregnancy. This may help if worn during sleep. Pads or tissues may be used in the bra if you are leaking colostrum.   Do not use hot tubs, steam rooms, or saunas.   Wear your seat belt when driving. This protects you and your baby if you are in an accident.   Avoid raw meat, cat litter boxes and soil used by cats. These carry germs that can cause birth defects in the baby.   It is easier to loose urine during pregnancy. Tightening up and strengthening the pelvic muscles will help with this problem. You can practice stopping your urination while you are going to the bathroom. These are the same muscles you need to strengthen. It is also the muscles you would use if you were trying to stop from passing gas. You can practice tightening these muscles up 10 times a set and repeating this about 3 times per day. Once you know what muscles to tighten up, do not perform these exercises during urination. It is more likely   to cause an infection by backing up the urine.   Ask for help if you have financial, counseling or nutritional needs during pregnancy. Your caregiver will be able to offer counseling for these needs as well as refer you for other special needs.   Make a list of emergency phone numbers and have them available.   Plan on getting help from family or friends when you go home from the hospital.   Make a trial run to the hospital.   Take prenatal classes with the father to understand, practice and ask questions about the labor and delivery.   Prepare the baby's room/nursery.   Do not travel out of the city unless it is absolutely necessary and with the advice of your caregiver.   Wear only low or no heal shoes to have better balance and prevent falling.  MEDICATIONS AND DRUG USE IN PREGNANCY  Take prenatal vitamins as directed. The vitamin should contain 1 milligram of folic acid. Keep all vitamins out of reach of children. Only a couple vitamins or tablets containing iron may be fatal  to a baby or young child when ingested.   Avoid use of all medications, including herbs, over-the-counter medications, not prescribed or suggested by your caregiver. Only take over-the-counter or prescription medicines for pain, discomfort, or fever as directed by your caregiver. Do not use aspirin, ibuprofen (Motrin, Advil, Nuprin) or naproxen (Aleve) unless OK'd by your caregiver.   Let your caregiver also know about herbs you may be using.   Alcohol is related to a number of birth defects. This includes fetal alcohol syndrome. All alcohol, in any form, should be avoided completely. Smoking will cause low birth rate and premature babies.   Street/illegal drugs are very harmful to the baby. They are absolutely forbidden. A baby born to an addicted mother will be addicted at birth. The baby will go through the same withdrawal an adult does.  SEEK MEDICAL CARE IF: You have any concerns or worries during your pregnancy. It is better to call with your questions if you feel they cannot wait, rather than worry about them. DECISIONS ABOUT CIRCUMCISION You may or may not know the sex of your baby. If you know your baby is a boy, it may be time to think about circumcision. Circumcision is the removal of the foreskin of the penis. This is the skin that covers the sensitive end of the penis. There is no proven medical need for this. Often this decision is made on what is popular at the time or based upon religious beliefs and social issues. You can discuss these issues with your caregiver or pediatrician. SEEK IMMEDIATE MEDICAL CARE IF:   An unexplained oral temperature above 102 F (38.9 C) develops, or as your caregiver suggests.   You have leaking of fluid from the vagina (birth canal). If leaking membranes are suspected, take your temperature and tell your caregiver of this when you call.   There is vaginal spotting, bleeding or passing clots. Tell your caregiver of the amount and how many pads are  used.   You develop a bad smelling vaginal discharge with a change in the color from clear to white.   You develop vomiting that lasts more than 24 hours.   You develop chills or fever.   You develop shortness of breath.   You develop burning on urination.   You loose more than 2 pounds of weight or gain more than 2 pounds of weight or as suggested by your   caregiver.   You notice sudden swelling of your face, hands, and feet or legs.   You develop belly (abdominal) pain. Round ligament discomfort is a common non-cancerous (benign) cause of abdominal pain in pregnancy. Your caregiver still must evaluate you.   You develop a severe headache that does not go away.   You develop visual problems, blurred or double vision.   If you have not felt your baby move for more than 1 hour. If you think the baby is not moving as much as usual, eat something with sugar in it and lie down on your left side for an hour. The baby should move at least 4 to 5 times per hour. Call right away if your baby moves less than that.   You fall, are in a car accident or any kind of trauma.   There is mental or physical violence at home.  Document Released: 05/29/2001 Document Revised: 02/14/2011 Document Reviewed: 12/01/2008 [facility] Patient Information 2012 [place], [place].Sterilization, Women Sterilization is a surgical procedure. This surgery permanently prevents pregnancy in women. This can be done by tying (with or without cutting) the fallopian tubes or burning the tubes closed (tubal ligation). Tubal ligation blocks the tubes and prevents the egg from being fertilized by the sperm. Sterilization can be done by removing the ovaries that produce the egg (castration) as well. Sterilization is considered safe with very rare complications. It does not affect menstrual periods, sexual desire, or performance.  Since sterilization is considered permanent, you should not do it until you are sure you do not want to  have more children. You and your partner should fully agree to have the procedure. Your decision to have the procedure should not be made when you are in a stressful situation. This can include a loss of a pregnancy, illness or death of a spouse, or divorce. There are other means of preventing unwanted pregnancies that can be used until you are completely sure you want to be sterilized. Sterilization does not protect against sexually transmitted disease. Women who had a sterilization procedure and want it reversed must know that it requires an expensive and major operation. The reversal may not be successful and has a high rate of tubal (ectopic) pregnancy that can be dangerous and require surgery. There are several ways to perform a tubal sterlization:  Laparoscopy. The abdomen is filled with a gas to see the pelvic organs. Then, a tube with a light attached is inserted into the abdomen through 2 small incisions. The fallopian tubes are blocked with a ring, clip or electrocautery to burn closed the tubes. Then, the gas is released and the small incisions are closed.   Hysteroscopy. A tube with a light is inserted in the vagina, through the cervix and then into the uterus. A spring-like instrument is inserted into the opening of the fallopian tubes. The spring causes scaring and blocks the tubes. Other forms of contraception should be used for three months at which time an X-ray is done to be sure the tubes are blocked.   Minilaparotomy. This is done right after giving birth. A small incision is made under the belly button and the tubes are exposed. The tubes can then be burned, tied and/or cut.   Tubal ligation can be done during a Cesarean section.   Castration is a surgical procedure that removes both ovaries.  Tubal sterilization should be discussed with your caregiver to answer any concerns you or your partner might have. This meeting will help to  decide for sure if the operation is safe for you  and which procedure is the best one for you. You can change your mind and cancel the surgery at any time. HOME CARE INSTRUCTIONS   Follow your caregivers instructions regarding diet, rest, work, social and sexual activities and follow up appointments.   Shoulder pain is common following a laparoscopy. The pain may be relieved by lying down flat.   Only take over-the-counter or prescription medicines for pain, discomfort or fever as directed by your caregiver.   You may use lozenges for throat discomfort.   Keep the incisions covered to prevent infection.  SEEK IMMEDIATE MEDICAL CARE IF:   You develop a temperature of 102 F (38.9 C), or as your caregiver suggests.   You become dizzy or faint.   You start to feel sick to your stomach (nausea) or throw up (vomit).   You develop abdominal pain not relieved with over-the-counter medications.   You have redness and puffiness (swelling) of the cut (incision).   You see pus draining from the incision.   You miss a menstrual period.  Document Released: 11/21/2007 Document Revised: 02/14/2011 Document Reviewed: 11/21/2007 [facility] Patient Information 2012 [place], [place].Cesarean Delivery  Cesarean delivery is the birth of a baby through a cut (incision) in the abdomen and womb (uterus).  LET YOUR CAREGIVER KNOW ABOUT:  Complicationsinvolving the pregnancy.   Allergies.   Medicines taken including herbs, eyedrops, over-the-counter medicines, and creams.   Use of steroids (by mouth or creams).   Previous problems with anesthetics or numbing medicine.   Previous surgery.   History of blood clots.   History of bleeding or blood problems.   Other health problems.  RISKS AND COMPLICATIONS   Bleeding.   Infection.   Blood clots.   Injury to surrounding organs.   Anesthesia problems.   Injury to the baby.  BEFORE THE PROCEDURE   A tube (Foley catheter) will be placed in your bladder. The Foley catheter drains the  urine from your bladder into a bag. This keeps your bladder empty during surgery.   An intravenous access tube (IV) will be placed in your arm.   Hair may be removed from your pubic area and your lower abdomen. This is to prevent infection in the incision site.   You may be given an antacid medicine to drink. This will prevent acid contents in your stomach from going into your lungs if you vomit during the surgery.   You may be given an antibiotic medicine to prevent infection.  PROCEDURE   You may be given medicine to numb the lower half of your body (regional anesthetic). If you were in labor, you may have already had an epidural in place which can be used in both labor and cesarean delivery. You may possibly be given medicine to make you sleep (general anesthetic) though this is not as common.   An incision will be made in your abdomen that extends to your uterus. There are 2 basic kinds of incisions:   The horizontal (transverse) incision. Horizontal incisions are used for most routine cesarean deliveries.   The vertical (up and down) incision. This is less commonly used. This is most often reserved for women who have a serious complication (extreme prematurity) or under emergency situations.   The horizontal and vertical incisions may both be used at the same time. However, this is very uncommon.   Your baby will then be delivered.  AFTER THE PROCEDURE   If you  were awake during the surgery, you will see your baby right away. If you were asleep, you will see your baby as soon as you are awake.   You may breastfeed your baby after surgery.   You may be able to get up and walk the same day as the surgery. If you need to stay in bed for a period of time, you will receive help to turn, cough, and take deep breaths after surgery. This helps prevent lung problems such as pneumonia.   Do not get out of bed alone the first time after surgery. You will need help getting out of bed until  you are able to do this by yourself.   You may be able to shower the day after your cesarean delivery. After the bandage (dressing) is taken off the incision site, a nurse will assist you to shower, if you like.   You will have pneumatic compressing hose placed on your feet or lower legs. These hose are used to prevent blood clots. When you are up and walking regularly, they will no longer be necessary.   Do not cross your legs when you sit.   Save any blood clots that you pass. If you pass a clot while on the toilet, do not flush it. Call for the nurse. Tell the nurse if you think you are bleeding too much or passing too many clots.   Start drinking liquids and eating food as directed by your caregiver. If your stomach is not ready, drinking and eating too soon can cause an increase in bloating and swelling of your intestine and abdomen. This is very uncomfortable.   You will be given medicine as needed. Let your caregivers know if you are hurting. They want you to be comfortable. You may also be given an antibiotic to prevent an infection.   Your IV will be taken out when you are drinking a reasonable amount of fluids. The Foley catheter is taken out when you are up and walking.   If your blood type is Rh negative and your baby's blood type is Rh positive, you will be given a shot of anti-D immune globulin. This shot prevents you from having Rh problems with a future pregnancy. You should get the shot even if you had your tubes tied (tubal ligation).   If you are allowed to take the baby for a walk, place the baby in the bassinet and push it. Do not carry your baby in your arms.  Document Released: 06/04/2005 Document Revised: 02/14/2011 Document Reviewed: 09/29/2010 [facility] Patient Information 2012 [place], [place].

## 2011-04-24 ENCOUNTER — Ambulatory Visit (INDEPENDENT_AMBULATORY_CARE_PROVIDER_SITE_OTHER): Payer: 59 | Admitting: Family Medicine

## 2011-04-24 VITALS — BP 117/70 | Wt 179.0 lb

## 2011-04-24 DIAGNOSIS — O34219 Maternal care for unspecified type scar from previous cesarean delivery: Secondary | ICD-10-CM

## 2011-04-24 DIAGNOSIS — Z23 Encounter for immunization: Secondary | ICD-10-CM

## 2011-04-24 DIAGNOSIS — J309 Allergic rhinitis, unspecified: Secondary | ICD-10-CM

## 2011-04-24 DIAGNOSIS — Z3480 Encounter for supervision of other normal pregnancy, unspecified trimester: Secondary | ICD-10-CM

## 2011-04-24 MED ORDER — INFLUENZA VIRUS VACC SPLIT PF IM SUSP: 0.5000 mL | Freq: Once | INTRAMUSCULAR | Status: DC

## 2011-04-24 MED ORDER — FEXOFENADINE HCL 180 MG PO TABS: 180.0000 mg | ORAL_TABLET | Freq: Every day | ORAL | Status: DC

## 2011-04-24 NOTE — Progress Notes (Signed)
Needs cultures--having allergic sx's, trial of allegra

## 2011-04-24 NOTE — Patient Instructions (Addendum)
Sterilization, Women Sterilization is a surgical procedure. This surgery permanently prevents pregnancy in women. This can be done by tying (with or without cutting) the fallopian tubes or burning the tubes closed (tubal ligation). Tubal ligation blocks the tubes and prevents the egg from being fertilized by the sperm. Sterilization can be done by removing the ovaries that produce the egg (castration) as well. Sterilization is considered safe with very rare complications. It does not affect menstrual periods, sexual desire, or performance.  Since sterilization is considered permanent, you should not do it until you are sure you do not want to have more children. You and your partner should fully agree to have the procedure. Your decision to have the procedure should not be made when you are in a stressful situation. This can include a loss of a pregnancy, illness or death of a spouse, or divorce. There are other means of preventing unwanted pregnancies that can be used until you are completely sure you want to be sterilized. Sterilization does not protect against sexually transmitted disease. Women who had a sterilization procedure and want it reversed must know that it requires an expensive and major operation. The reversal may not be successful and has a high rate of tubal (ectopic) pregnancy that can be dangerous and require surgery. There are several ways to perform a tubal sterlization:  Laparoscopy. The abdomen is filled with a gas to see the pelvic organs. Then, a tube with a light attached is inserted into the abdomen through 2 small incisions. The fallopian tubes are blocked with a ring, clip or electrocautery to burn closed the tubes. Then, the gas is released and the small incisions are closed.   Hysteroscopy. A tube with a light is inserted in the vagina, through the cervix and then into the uterus. A spring-like instrument is inserted into the opening of the fallopian tubes. The spring causes  scaring and blocks the tubes. Other forms of contraception should be used for three months at which time an X-ray is done to be sure the tubes are blocked.   Minilaparotomy. This is done right after giving birth. A small incision is made under the belly button and the tubes are exposed. The tubes can then be burned, tied and/or cut.   Tubal ligation can be done during a Cesarean section.   Castration is a surgical procedure that removes both ovaries.  Tubal sterilization should be discussed with your caregiver to answer any concerns you or your partner might have. This meeting will help to decide for sure if the operation is safe for you and which procedure is the best one for you. You can change your mind and cancel the surgery at any time. HOME CARE INSTRUCTIONS   Follow your caregivers instructions regarding diet, rest, work, social and sexual activities and follow up appointments.   Shoulder pain is common following a laparoscopy. The pain may be relieved by lying down flat.   Only take over-the-counter or prescription medicines for pain, discomfort or fever as directed by your caregiver.   You may use lozenges for throat discomfort.   Keep the incisions covered to prevent infection.  SEEK IMMEDIATE MEDICAL CARE IF:   You develop a temperature of 102 F (38.9 C), or as your caregiver suggests.   You become dizzy or faint.   You start to feel sick to your stomach (nausea) or throw up (vomit).   You develop abdominal pain not relieved with over-the-counter medications.   You have redness and puffiness (  swelling) of the cut (incision).   You see pus draining from the incision.   You miss a menstrual period.  Document Released: 11/21/2007 Document Revised: 02/14/2011 Document Reviewed: 11/21/2007 [facility] Patient Information 2012 [place], [place].Cesarean Delivery  Cesarean delivery is the birth of a baby through a cut (incision) in the abdomen and womb (uterus).  LET YOUR  CAREGIVER KNOW ABOUT:  Complicationsinvolving the pregnancy.   Allergies.   Medicines taken including herbs, eyedrops, over-the-counter medicines, and creams.   Use of steroids (by mouth or creams).   Previous problems with anesthetics or numbing medicine.   Previous surgery.   History of blood clots.   History of bleeding or blood problems.   Other health problems.  RISKS AND COMPLICATIONS   Bleeding.   Infection.   Blood clots.   Injury to surrounding organs.   Anesthesia problems.   Injury to the baby.  BEFORE THE PROCEDURE   A tube (Foley catheter) will be placed in your bladder. The Foley catheter drains the urine from your bladder into a bag. This keeps your bladder empty during surgery.   An intravenous access tube (IV) will be placed in your arm.   Hair may be removed from your pubic area and your lower abdomen. This is to prevent infection in the incision site.   You may be given an antacid medicine to drink. This will prevent acid contents in your stomach from going into your lungs if you vomit during the surgery.   You may be given an antibiotic medicine to prevent infection.  PROCEDURE   You may be given medicine to numb the lower half of your body (regional anesthetic). If you were in labor, you may have already had an epidural in place which can be used in both labor and cesarean delivery. You may possibly be given medicine to make you sleep (general anesthetic) though this is not as common.   An incision will be made in your abdomen that extends to your uterus. There are 2 basic kinds of incisions:   The horizontal (transverse) incision. Horizontal incisions are used for most routine cesarean deliveries.   The vertical (up and down) incision. This is less commonly used. This is most often reserved for women who have a serious complication (extreme prematurity) or under emergency situations.   The horizontal and vertical incisions may both be used  at the same time. However, this is very uncommon.   Your baby will then be delivered.  AFTER THE PROCEDURE   If you were awake during the surgery, you will see your baby right away. If you were asleep, you will see your baby as soon as you are awake.   You may breastfeed your baby after surgery.   You may be able to get up and walk the same day as the surgery. If you need to stay in bed for a period of time, you will receive help to turn, cough, and take deep breaths after surgery. This helps prevent lung problems such as pneumonia.   Do not get out of bed alone the first time after surgery. You will need help getting out of bed until you are able to do this by yourself.   You may be able to shower the day after your cesarean delivery. After the bandage (dressing) is taken off the incision site, a nurse will assist you to shower, if you like.   You will have pneumatic compressing hose placed on your feet or lower legs. These hose  are used to prevent blood clots. When you are up and walking regularly, they will no longer be necessary.   Do not cross your legs when you sit.   Save any blood clots that you pass. If you pass a clot while on the toilet, do not flush it. Call for the nurse. Tell the nurse if you think you are bleeding too much or passing too many clots.   Start drinking liquids and eating food as directed by your caregiver. If your stomach is not ready, drinking and eating too soon can cause an increase in bloating and swelling of your intestine and abdomen. This is very uncomfortable.   You will be given medicine as needed. Let your caregivers know if you are hurting. They want you to be comfortable. You may also be given an antibiotic to prevent an infection.   Your IV will be taken out when you are drinking a reasonable amount of fluids. The Foley catheter is taken out when you are up and walking.   If your blood type is Rh negative and your baby's blood type is Rh  positive, you will be given a shot of anti-D immune globulin. This shot prevents you from having Rh problems with a future pregnancy. You should get the shot even if you had your tubes tied (tubal ligation).   If you are allowed to take the baby for a walk, place the baby in the bassinet and push it. Do not carry your baby in your arms.  Document Released: 06/04/2005 Document Revised: 02/14/2011 Document Reviewed: 09/29/2010 [facility] Patient Information 2012 [place], [place]. Breastfeeding BENEFITS OF BREASTFEEDING For the baby  The first milk (colostrum) helps the baby's digestive system function better.   There are antibodies from the mother in the milk that help the baby fight off infections.   The baby has a lower incidence of asthma, allergies, and SIDS (sudden infant death syndrome).   The nutrients in breast milk are better than formulas for the baby and helps the baby's brain grow better.   Babies who breastfeed have less gas, colic, and constipation.  For the mother  Breastfeeding helps develop a very special bond between mother and baby.   It is more convenient, always available at the correct temperature and cheaper than formula feeding.   It burns calories in the mother and helps with losing weight that was gained during pregnancy.   It makes the uterus contract back down to normal size faster and slows bleeding following delivery.   Breastfeeding mothers have a lower risk of developing breast cancer.  NURSE FREQUENTLY  A healthy, full-term baby may breastfeed as often as every hour or space his or her feedings to every 3 hours.   How often to nurse will vary from baby to baby. Watch your baby for signs of hunger, not the clock.   Nurse as often as the baby requests, or when you feel the need to reduce the fullness of your breasts.   Awaken the baby if it has been 3 to 4 hours since the last feeding.   Frequent feeding will help the mother make more milk and will  prevent problems like sore nipples and engorgement of the breasts.  BABY'S POSITION AT THE BREAST  Whether lying down or sitting, be sure that the baby's tummy is facing your tummy.   Support the breast with 4 fingers underneath the breast and the thumb above. Make sure your fingers are well away from the nipple and  baby's mouth.   Stroke the baby's lips and cheek closest to the breast gently with your finger or nipple.   When the baby's mouth is open wide enough, place all of your nipple and as much of the dark area around the nipple as possible into your baby's mouth.   Pull the baby in close so the tip of the nose and the baby's cheeks touch the breast during the feeding.  FEEDINGS  The length of each feeding varies from baby to baby and from feeding to feeding.   The baby must suck about 2 to 3 minutes for your milk to get to him or her. This is called a "let down." For this reason, allow the baby to feed on each breast as long as he or she wants. Your baby will end the feeding when he or she has received the right balance of nutrients.   To break the suction, put your finger into the corner of the baby's mouth and slide it between his or her gums before removing your breast from his or her mouth. This will help prevent sore nipples.  REDUCING BREAST ENGORGEMENT  In the first week after your baby is born, you may experience signs of breast engorgement. When breasts are engorged, they feel heavy, warm, full, and may be tender to the touch. You can reduce engorgement if you:   Nurse frequently, every 2 to 3 hours. Mothers who breastfeed early and often have fewer problems with engorgement.   Place light ice packs on your breasts between feedings. This reduces swelling. Wrap the ice packs in a lightweight towel to protect your skin.   Apply moist hot packs to your breast for 5 to 10 minutes before each feeding. This increases circulation and helps the milk flow.   Gently massage your  breast before and during the feeding.   Make sure that the baby empties at least one breast at every feeding before switching sides.   Use a breast pump to empty the breasts if your baby is sleepy or not nursing well. You may also want to pump if you are returning to work or or you feel you are getting engorged.   Avoid bottle feeds, pacifiers or supplemental feedings of water or juice in place of breastfeeding.   Be sure the baby is latched on and positioned properly while breastfeeding.   Prevent fatigue, stress, and anemia.   Wear a supportive bra, avoiding underwire styles.   Eat a balanced diet with enough fluids.  If you follow these suggestions, your engorgement should improve in 24 to 48 hours. If you are still experiencing difficulty, call your lactation consultant or caregiver. IS MY BABY GETTING ENOUGH MILK? Sometimes, mothers worry about whether their babies are getting enough milk. You can be assured that your baby is getting enough milk if:  The baby is actively sucking and you hear swallowing.   The baby nurses at least 8 to 12 times in a 24 hour time period. Nurse your baby until he or she unlatches or falls asleep at the first breast (at least 10 to 20 minutes), then offer the second side.   The baby is wetting 5 to 6 disposable diapers (6 to 8 cloth diapers) in a 24 hour period by 62 to 51 days of age.   The baby is having at least 2 to 3 stools every 24 hours for the first few months. Breast milk is all the food your baby needs. It is not  necessary for your baby to have water or formula. In fact, to help your breasts make more milk, it is best not to give your baby supplemental feedings during the early weeks.   The stool should be soft and yellow.   The baby should gain 4 to 7 ounces per week after he is 46 days old.  TAKE CARE OF YOURSELF Take care of your breasts by:  Bathing or showering daily.   Avoiding the use of soaps on your nipples.   Start feedings on  your left breast at one feeding and on your right breast at the next feeding.   You will notice an increase in your milk supply 2 to 5 days after delivery. You may feel some discomfort from engorgement, which makes your breasts very firm and often tender. Engorgement "peaks" out within 24 to 48 hours. In the meantime, apply warm moist towels to your breasts for 5 to 10 minutes before feeding. Gentle massage and expression of some milk before feeding will soften your breasts, making it easier for your baby to latch on. Wear a well fitting nursing bra and air dry your nipples for 10 to 15 minutes after each feeding.   Only use cotton bra pads.   Only use pure lanolin on your nipples after nursing. You do not need to wash it off before nursing.  Take care of yourself by:   Eating well-balanced meals and nutritious snacks.   Drinking milk, fruit juice, and water to satisfy your thirst (about 8 glasses a day).   Getting plenty of rest.   Increasing calcium in your diet (1200 mg a day).   Avoiding foods that you notice affect the baby in a bad way.  SEEK MEDICAL CARE IF:   You have any questions or difficulty with breastfeeding.   You need help.   You have a hard, red, sore area on your breast, accompanied by a fever of 100.5 F (38.1 C) or more.   Your baby is too sleepy to eat well or is having trouble sleeping.   Your baby is wetting less than 6 diapers per day, by 80 days of age.   Your baby's skin or white part of his or her eyes is more yellow than it was in the hospital.   You feel depressed.  Document Released: 06/04/2005 Document Revised: 02/14/2011 Document Reviewed: 01/17/2009 [facility] Patient Information 2012 [place], [place].

## 2011-04-25 LAB — GC/CHLAMYDIA PROBE AMP, GENITAL
Chlamydia, DNA Probe: NEGATIVE
GC Probe Amp, Genital: NEGATIVE

## 2011-05-03 ENCOUNTER — Ambulatory Visit (INDEPENDENT_AMBULATORY_CARE_PROVIDER_SITE_OTHER): Payer: 59 | Admitting: Family Medicine

## 2011-05-03 VITALS — BP 110/70 | Wt 184.0 lb

## 2011-05-03 DIAGNOSIS — O34219 Maternal care for unspecified type scar from previous cesarean delivery: Secondary | ICD-10-CM

## 2011-05-03 DIAGNOSIS — Z3480 Encounter for supervision of other normal pregnancy, unspecified trimester: Secondary | ICD-10-CM

## 2011-05-03 NOTE — Progress Notes (Signed)
For repeat C-section.

## 2011-05-07 ENCOUNTER — Encounter (HOSPITAL_COMMUNITY): Payer: Self-pay

## 2011-05-07 NOTE — Patient Instructions (Addendum)
   Your procedure is scheduled on: Tuesday, Nov 27th  Enter through the Main Entrance of [facility] at: 7:30am Pick up the phone at the desk and dial [phone] and inform us of your arrival.  Please call this number if you have any problems the morning of surgery: [phone]  Remember: Do not eat food after midnight: Monday Do not drink clear liquids after: Monday Take these medicines the morning of surgery with a SIP OF WATER: zantac  Do not wear jewelry, make-up, or FINGER nail polish Do not wear lotions, powders, or perfumes.  You may not  wear deodorant. Do not shave 48 hours prior to surgery. Do not bring valuables to the hospital.  Leave suitcase in the car. After Surgery it may be brought to your room. For patients being admitted to the hospital, checkout time is 11:00am the day of discharge. Home with husband "[place]"  cell [phone]  Remember to use your hibiclens as instructed.Please shower with 1/2 bottle the evening before your surgery and the other 1/2 bottle the morning of surgery.

## 2011-05-09 ENCOUNTER — Other Ambulatory Visit: Payer: Self-pay

## 2011-05-09 ENCOUNTER — Encounter (HOSPITAL_COMMUNITY): Payer: Self-pay

## 2011-05-09 ENCOUNTER — Encounter (HOSPITAL_COMMUNITY)
Admission: RE | Admit: 2011-05-09 | Discharge: 2011-05-09 | Disposition: A | Discharge status: Home / Self Care | Payer: 59 | Source: Ambulatory Visit | Attending: Family Medicine | Admitting: Family Medicine

## 2011-05-09 HISTORY — DX: Gastro-esophageal reflux disease without esophagitis: K21.9

## 2011-05-09 LAB — SURGICAL PCR SCREEN
MRSA, PCR: NEGATIVE
Staphylococcus aureus: POSITIVE — AB

## 2011-05-09 LAB — CBC
HCT: 29.8 % — ABNORMAL LOW (ref 36.0–46.0)
MCHC: 32.2 g/dL (ref 30.0–36.0)
Platelets: 263 10*3/uL (ref 150–400)
RDW: 14.9 % (ref 11.5–15.5)
WBC: 10.9 10*3/uL — ABNORMAL HIGH (ref 4.0–10.5)

## 2011-05-09 LAB — RPR: RPR Ser Ql: NONREACTIVE

## 2011-05-09 NOTE — Pre-Procedure Instructions (Signed)
Dr Dana Allan saw this patient at pre-op appt.

## 2011-05-15 ENCOUNTER — Encounter (HOSPITAL_COMMUNITY): Payer: Self-pay | Admitting: Anesthesiology

## 2011-05-15 ENCOUNTER — Inpatient Hospital Stay (HOSPITAL_COMMUNITY)
Admission: RE | Admit: 2011-05-15 | Discharge: 2011-05-18 | DRG: 766 | Disposition: A | Discharge status: Home / Self Care | Payer: 59 | Source: Ambulatory Visit | Attending: Family Medicine | Admitting: Family Medicine

## 2011-05-15 ENCOUNTER — Encounter (HOSPITAL_COMMUNITY): Payer: Self-pay | Admitting: *Deleted

## 2011-05-15 ENCOUNTER — Encounter (HOSPITAL_COMMUNITY)
Admission: RE | Disposition: A | Discharge status: Home / Self Care | Payer: Self-pay | Source: Ambulatory Visit | Attending: Family Medicine

## 2011-05-15 ENCOUNTER — Inpatient Hospital Stay (HOSPITAL_COMMUNITY): Payer: 59 | Admitting: Anesthesiology

## 2011-05-15 ENCOUNTER — Encounter (HOSPITAL_COMMUNITY): Payer: Self-pay | Admitting: Family Medicine

## 2011-05-15 DIAGNOSIS — Z302 Encounter for sterilization: Secondary | ICD-10-CM

## 2011-05-15 DIAGNOSIS — Z01818 Encounter for other preprocedural examination: Secondary | ICD-10-CM

## 2011-05-15 DIAGNOSIS — O34219 Maternal care for unspecified type scar from previous cesarean delivery: Secondary | ICD-10-CM

## 2011-05-15 DIAGNOSIS — Z01812 Encounter for preprocedural laboratory examination: Secondary | ICD-10-CM

## 2011-05-15 LAB — TYPE AND SCREEN: ABO/RH(D): A POS

## 2011-05-15 SURGERY — Surgical Case
Anesthesia: Spinal | Site: Abdomen | Wound class: Clean Contaminated

## 2011-05-15 MED ORDER — KETOROLAC TROMETHAMINE 30 MG/ML IJ SOLN: 30.0000 mg | Freq: Four times a day (QID) | INTRAMUSCULAR | Status: AC | PRN

## 2011-05-15 MED ORDER — MORPHINE SULFATE (PF) 0.5 MG/ML IJ SOLN
INTRAMUSCULAR | Status: DC | PRN
  Administered 2011-05-15: .2 mg via INTRATHECAL

## 2011-05-15 MED ORDER — SIMETHICONE 80 MG PO CHEW
80.0000 mg | CHEWABLE_TABLET | Freq: Three times a day (TID) | ORAL | Status: DC
  Administered 2011-05-15 – 2011-05-17 (×5): 80 mg via ORAL

## 2011-05-15 MED ORDER — SODIUM CHLORIDE 0.9 % IV SOLN
1.0000 ug/kg/h | INTRAVENOUS | Status: DC | PRN
  Filled 2011-05-15: qty 2.5

## 2011-05-15 MED ORDER — SIMETHICONE 80 MG PO CHEW
80.0000 mg | CHEWABLE_TABLET | ORAL | Status: DC | PRN
  Administered 2011-05-17: 80 mg via ORAL

## 2011-05-15 MED ORDER — OXYTOCIN 10 UNIT/ML IJ SOLN
INTRAMUSCULAR | Status: DC | PRN
  Administered 2011-05-15: 20 [IU] via INTRAMUSCULAR

## 2011-05-15 MED ORDER — ZOLPIDEM TARTRATE 5 MG PO TABS: 5.0000 mg | ORAL_TABLET | Freq: Every evening | ORAL | Status: DC | PRN

## 2011-05-15 MED ORDER — OXYTOCIN 20 UNITS IN LACTATED RINGERS INFUSION - SIMPLE
125.0000 mL/h | INTRAVENOUS | Status: AC
  Administered 2011-05-15: 125 mL/h via INTRAVENOUS

## 2011-05-15 MED ORDER — TETANUS-DIPHTH-ACELL PERTUSSIS 5-2.5-18.5 LF-MCG/0.5 IM SUSP
0.5000 mL | Freq: Once | INTRAMUSCULAR | Status: AC
  Administered 2011-05-16: 0.5 mL via INTRAMUSCULAR
  Filled 2011-05-15: qty 0.5

## 2011-05-15 MED ORDER — KETOROLAC TROMETHAMINE 30 MG/ML IJ SOLN: 15.0000 mg | Freq: Once | INTRAMUSCULAR | Status: DC | PRN

## 2011-05-15 MED ORDER — EPHEDRINE SULFATE 50 MG/ML IJ SOLN
INTRAMUSCULAR | Status: AC
  Administered 2011-05-15: 5 mg
  Filled 2011-05-15: qty 1

## 2011-05-15 MED ORDER — LACTATED RINGERS IV SOLN
INTRAVENOUS | Status: DC
  Administered 2011-05-15: 22:00:00 via INTRAVENOUS

## 2011-05-15 MED ORDER — DIPHENHYDRAMINE HCL 50 MG/ML IJ SOLN: 12.5000 mg | INTRAMUSCULAR | Status: DC | PRN

## 2011-05-15 MED ORDER — CEFAZOLIN SODIUM 1-5 GM-% IV SOLN
INTRAVENOUS | Status: AC
  Filled 2011-05-15: qty 50

## 2011-05-15 MED ORDER — MENTHOL 3 MG MT LOZG: 1.0000 | LOZENGE | OROMUCOSAL | Status: DC | PRN

## 2011-05-15 MED ORDER — PROMETHAZINE HCL 25 MG/ML IJ SOLN: 6.2500 mg | INTRAMUSCULAR | Status: DC | PRN

## 2011-05-15 MED ORDER — SENNOSIDES-DOCUSATE SODIUM 8.6-50 MG PO TABS
2.0000 | ORAL_TABLET | Freq: Every day | ORAL | Status: DC
  Administered 2011-05-15 – 2011-05-17 (×3): 2 via ORAL

## 2011-05-15 MED ORDER — ONDANSETRON HCL 4 MG/2ML IJ SOLN: 4.0000 mg | Freq: Three times a day (TID) | INTRAMUSCULAR | Status: DC | PRN

## 2011-05-15 MED ORDER — ONDANSETRON HCL 4 MG/2ML IJ SOLN: 4.0000 mg | INTRAMUSCULAR | Status: DC | PRN

## 2011-05-15 MED ORDER — BUPIVACAINE IN DEXTROSE 0.75-8.25 % IT SOLN
INTRATHECAL | Status: DC | PRN
  Administered 2011-05-15: 1.8 mL via INTRATHECAL

## 2011-05-15 MED ORDER — OXYCODONE-ACETAMINOPHEN 5-325 MG PO TABS
1.0000 | ORAL_TABLET | ORAL | Status: DC | PRN
  Administered 2011-05-15 (×2): 1 via ORAL
  Administered 2011-05-16 – 2011-05-17 (×5): 2 via ORAL
  Administered 2011-05-17: 1 via ORAL
  Administered 2011-05-17 – 2011-05-18 (×2): 2 via ORAL
  Filled 2011-05-15 (×4): qty 2
  Filled 2011-05-15: qty 1
  Filled 2011-05-15 (×3): qty 2
  Filled 2011-05-15 (×2): qty 1

## 2011-05-15 MED ORDER — SCOPOLAMINE 1 MG/3DAYS TD PT72
1.0000 | MEDICATED_PATCH | Freq: Once | TRANSDERMAL | Status: DC
  Administered 2011-05-15: 1.5 mg via TRANSDERMAL

## 2011-05-15 MED ORDER — PRENATAL PLUS 27-1 MG PO TABS
1.0000 | ORAL_TABLET | Freq: Every day | ORAL | Status: DC
  Administered 2011-05-16 – 2011-05-18 (×3): 1 via ORAL
  Filled 2011-05-15 (×3): qty 1

## 2011-05-15 MED ORDER — EPHEDRINE 5 MG/ML INJ
INTRAVENOUS | Status: AC
  Filled 2011-05-15: qty 10

## 2011-05-15 MED ORDER — DIPHENHYDRAMINE HCL 50 MG/ML IJ SOLN: 25.0000 mg | INTRAMUSCULAR | Status: DC | PRN

## 2011-05-15 MED ORDER — BUPIVACAINE HCL (PF) 0.25 % IJ SOLN
INTRAMUSCULAR | Status: DC | PRN
  Administered 2011-05-15: 25 mL

## 2011-05-15 MED ORDER — NALBUPHINE HCL 10 MG/ML IJ SOLN
5.0000 mg | INTRAMUSCULAR | Status: DC | PRN
  Filled 2011-05-15 (×2): qty 1

## 2011-05-15 MED ORDER — ONDANSETRON HCL 4 MG/2ML IJ SOLN
INTRAMUSCULAR | Status: DC | PRN
  Administered 2011-05-15: 4 mg via INTRAVENOUS

## 2011-05-15 MED ORDER — ONDANSETRON HCL 4 MG/2ML IJ SOLN
INTRAMUSCULAR | Status: AC
  Filled 2011-05-15: qty 2

## 2011-05-15 MED ORDER — KETOROLAC TROMETHAMINE 60 MG/2ML IM SOLN
60.0000 mg | Freq: Once | INTRAMUSCULAR | Status: AC | PRN
  Administered 2011-05-15: 60 mg via INTRAMUSCULAR

## 2011-05-15 MED ORDER — KETOROLAC TROMETHAMINE 60 MG/2ML IM SOLN
INTRAMUSCULAR | Status: AC
  Administered 2011-05-15: 60 mg via INTRAMUSCULAR
  Filled 2011-05-15: qty 2

## 2011-05-15 MED ORDER — ONDANSETRON HCL 4 MG PO TABS: 4.0000 mg | ORAL_TABLET | ORAL | Status: DC | PRN

## 2011-05-15 MED ORDER — DIBUCAINE 1 % RE OINT: 1.0000 "application " | TOPICAL_OINTMENT | RECTAL | Status: DC | PRN

## 2011-05-15 MED ORDER — MEPERIDINE HCL 25 MG/ML IJ SOLN: 6.2500 mg | INTRAMUSCULAR | Status: DC | PRN

## 2011-05-15 MED ORDER — OXYTOCIN 10 UNIT/ML IJ SOLN
INTRAMUSCULAR | Status: AC
  Filled 2011-05-15: qty 2

## 2011-05-15 MED ORDER — EPHEDRINE SULFATE 50 MG/ML IJ SOLN
INTRAMUSCULAR | Status: DC | PRN
  Administered 2011-05-15: 5 mg via INTRAVENOUS

## 2011-05-15 MED ORDER — LANOLIN HYDROUS EX OINT: 1.0000 "application " | TOPICAL_OINTMENT | CUTANEOUS | Status: DC | PRN

## 2011-05-15 MED ORDER — IBUPROFEN 600 MG PO TABS
600.0000 mg | ORAL_TABLET | Freq: Four times a day (QID) | ORAL | Status: DC
  Administered 2011-05-15 – 2011-05-18 (×12): 600 mg via ORAL
  Filled 2011-05-15 (×5): qty 1

## 2011-05-15 MED ORDER — HYDROMORPHONE HCL PF 1 MG/ML IJ SOLN: 0.2500 mg | INTRAMUSCULAR | Status: DC | PRN

## 2011-05-15 MED ORDER — SODIUM CHLORIDE 0.9 % IJ SOLN: 3.0000 mL | INTRAMUSCULAR | Status: DC | PRN

## 2011-05-15 MED ORDER — OXYTOCIN 20 UNITS IN LACTATED RINGERS INFUSION - SIMPLE
INTRAVENOUS | Status: AC
  Administered 2011-05-15: 125 mL/h via INTRAVENOUS
  Filled 2011-05-15: qty 1000

## 2011-05-15 MED ORDER — DIPHENHYDRAMINE HCL 25 MG PO CAPS: 25.0000 mg | ORAL_CAPSULE | ORAL | Status: DC | PRN

## 2011-05-15 MED ORDER — GLYCOPYRROLATE 0.2 MG/ML IJ SOLN
INTRAMUSCULAR | Status: AC
  Administered 2011-05-15: 0.2 mg
  Filled 2011-05-15: qty 1

## 2011-05-15 MED ORDER — NALOXONE HCL 0.4 MG/ML IJ SOLN: 0.4000 mg | INTRAMUSCULAR | Status: DC | PRN

## 2011-05-15 MED ORDER — FAMOTIDINE 20 MG PO TABS
20.0000 mg | ORAL_TABLET | Freq: Every day | ORAL | Status: DC
  Administered 2011-05-15 – 2011-05-17 (×3): 20 mg via ORAL
  Filled 2011-05-15 (×3): qty 1

## 2011-05-15 MED ORDER — FENTANYL CITRATE 0.05 MG/ML IJ SOLN
INTRAMUSCULAR | Status: AC
  Filled 2011-05-15: qty 2

## 2011-05-15 MED ORDER — WITCH HAZEL-GLYCERIN EX PADS: 1.0000 "application " | MEDICATED_PAD | CUTANEOUS | Status: DC | PRN

## 2011-05-15 MED ORDER — CEFAZOLIN SODIUM 1-5 GM-% IV SOLN: 1.0000 g | INTRAVENOUS | Status: DC

## 2011-05-15 MED ORDER — MORPHINE SULFATE 0.5 MG/ML IJ SOLN
INTRAMUSCULAR | Status: AC
  Filled 2011-05-15: qty 10

## 2011-05-15 MED ORDER — LACTATED RINGERS IV SOLN
INTRAVENOUS | Status: DC
  Administered 2011-05-15 (×4): via INTRAVENOUS

## 2011-05-15 MED ORDER — DIPHENHYDRAMINE HCL 25 MG PO CAPS: 25.0000 mg | ORAL_CAPSULE | Freq: Four times a day (QID) | ORAL | Status: DC | PRN

## 2011-05-15 MED ORDER — LACTATED RINGERS IV SOLN: INTRAVENOUS | Status: DC

## 2011-05-15 MED ORDER — NALBUPHINE HCL 10 MG/ML IJ SOLN
5.0000 mg | INTRAMUSCULAR | Status: DC | PRN
  Administered 2011-05-15: 10 mg via SUBCUTANEOUS
  Filled 2011-05-15: qty 1

## 2011-05-15 MED ORDER — CEFAZOLIN SODIUM 1-5 GM-% IV SOLN
1.0000 g | INTRAVENOUS | Status: AC
  Administered 2011-05-15: 1 g via INTRAVENOUS

## 2011-05-15 MED ORDER — SCOPOLAMINE 1 MG/3DAYS TD PT72
MEDICATED_PATCH | TRANSDERMAL | Status: AC
  Administered 2011-05-15: 1.5 mg via TRANSDERMAL
  Filled 2011-05-15: qty 1

## 2011-05-15 MED ORDER — FENTANYL CITRATE 0.05 MG/ML IJ SOLN
INTRAMUSCULAR | Status: DC | PRN
  Administered 2011-05-15: 25 ug via INTRATHECAL

## 2011-05-15 MED ORDER — IBUPROFEN 600 MG PO TABS
600.0000 mg | ORAL_TABLET | Freq: Four times a day (QID) | ORAL | Status: DC | PRN
  Filled 2011-05-15 (×7): qty 1

## 2011-05-15 SURGICAL SUPPLY — 32 items
CHLORAPREP W/TINT 26ML (MISCELLANEOUS) ×2 IMPLANT
CLIP FILSHIE TUBAL LIGA STRL (Clip) ×1 IMPLANT
CLOTH BEACON ORANGE TIMEOUT ST (SAFETY) ×2 IMPLANT
DRESSING TELFA 8X3 (GAUZE/BANDAGES/DRESSINGS) ×3 IMPLANT
DRSG PAD ABDOMINAL 8X10 ST (GAUZE/BANDAGES/DRESSINGS) ×1 IMPLANT
ELECT REM PT RETURN 9FT ADLT (ELECTROSURGICAL) ×2
ELECTRODE REM PT RTRN 9FT ADLT (ELECTROSURGICAL) ×1 IMPLANT
EXTRACTOR VACUUM M CUP 4 TUBE (SUCTIONS) IMPLANT
GAUZE SPONGE 4X4 12PLY STRL LF (GAUZE/BANDAGES/DRESSINGS) ×3 IMPLANT
GLOVE BIOGEL PI IND STRL 7.0 (GLOVE) ×1 IMPLANT
GLOVE BIOGEL PI INDICATOR 7.0 (GLOVE) ×3
GLOVE ECLIPSE 7.0 STRL STRAW (GLOVE) ×2 IMPLANT
GOWN PREVENTION PLUS LG XLONG (DISPOSABLE) ×5 IMPLANT
GOWN PREVENTION PLUS XLARGE (GOWN DISPOSABLE) ×2 IMPLANT
KIT ABG SYR 3ML LUER SLIP (SYRINGE) IMPLANT
NDL HYPO 25X5/8 SAFETYGLIDE (NEEDLE) IMPLANT
NEEDLE HYPO 22GX1.5 SAFETY (NEEDLE) ×2 IMPLANT
NEEDLE HYPO 25X5/8 SAFETYGLIDE (NEEDLE) IMPLANT
NS IRRIG 1000ML POUR BTL (IV SOLUTION) ×2 IMPLANT
PACK C SECTION WH (CUSTOM PROCEDURE TRAY) ×2 IMPLANT
PAD ABD 7.5X8 STRL (GAUZE/BANDAGES/DRESSINGS) IMPLANT
RTRCTR C-SECT PINK 25CM LRG (MISCELLANEOUS) ×1 IMPLANT
SLEEVE SCD COMPRESS KNEE MED (MISCELLANEOUS) IMPLANT
STAPLER VISISTAT 35W (STAPLE) IMPLANT
SUT VIC AB 0 CTX 36 (SUTURE) ×6
SUT VIC AB 0 CTX36XBRD ANBCTRL (SUTURE) ×3 IMPLANT
SUT VIC AB 4-0 KS 27 (SUTURE) IMPLANT
SYR 30ML LL (SYRINGE) ×2 IMPLANT
TAPE CLOTH SURG 4X10 WHT LF (GAUZE/BANDAGES/DRESSINGS) ×1 IMPLANT
TOWEL OR 17X24 6PK STRL BLUE (TOWEL DISPOSABLE) ×4 IMPLANT
TRAY FOLEY CATH 14FR (SET/KITS/TRAYS/PACK) ×2 IMPLANT
WATER STERILE IRR 1000ML POUR (IV SOLUTION) ×2 IMPLANT

## 2011-05-15 NOTE — Anesthesia Postprocedure Evaluation (Signed)
  Anesthesia Post-op Note  Patient: Tanya Meza  Procedure(s) Performed:  CESAREAN SECTION WITH BILATERAL TUBAL LIGATION  Patient Location: PACU and Mother/Baby  Anesthesia Type: Spinal  Level of Consciousness: awake, alert , oriented and patient cooperative  Airway and Oxygen Therapy: Patient Spontanous Breathing  Post-op Pain: mild  Post-op Assessment: Post-op Vital signs reviewed  Post-op Vital Signs: Reviewed and stable  Complications: No apparent anesthesia complications

## 2011-05-15 NOTE — Transfer of Care (Signed)
Immediate Anesthesia Transfer of Care Note  Patient: Tanya Meza  Procedure(s) Performed:  CESAREAN SECTION WITH BILATERAL TUBAL LIGATION  Patient Location: PACU  Anesthesia Type: Spinal  Level of Consciousness: awake  Airway & Oxygen Therapy: Patient Spontanous Breathing  Post-op Assessment: Report given to PACU RN  Post vital signs: Reviewed and stable  Complications: No apparent anesthesia complications

## 2011-05-15 NOTE — H&P (Signed)
   Chief Complaint:  No chief complaint on file.   Tanya Meza is  30 y.o. [id number] Patient's last menstrual period was 08/17/2010.. .  She is [redacted]w[redacted]d by LMP, early ultrasound.  She presents complaining of No chief complaint on file. She has a h/o 2 previous c-sections and requires repeat.  Desires BTL    Past Medical History  Diagnosis Date  . PCOS (polycystic ovarian syndrome)   . Tachycardia     RVOF  . Nephrolithiasis     no surgery required  . GERD (gastroesophageal reflux disease)     with preg, on zantac    Past Surgical History  Procedure Date  . Cesarean section 2005, 2008    x 2  . Wisdom tooth extraction     Family History  Problem Relation Age of Onset  . Thyroid disease Mother   . Diabetes Father     History  Substance Use Topics  . Smoking status: Never Smoker   . Smokeless tobacco: Never Used  . Alcohol Use: Yes     none with pregnancy    Allergies:  Allergies  Allergen Reactions  . Latex     Swelling- around catheter    Prescriptions prior to admission  Medication Sig Dispense Refill  . ranitidine (ZANTAC) 150 MG capsule Take 150 mg by mouth 2 (two) times daily.        . Pediatric Multivit-Minerals-C (FLINTSTONES COMPLETE PO) Take 2 tablets by mouth daily.         Review of Systems - Negative except anxiety over upcoming surgery.  Good fetal movement.  Physical Exam   Blood pressure 130/88, pulse 69, temperature 98.1 F (36.7 C), temperature source Oral, resp. rate 18, last menstrual period 08/17/2010, SpO2 100.00%. General: General appearance - alert, well appearing, and in no distress Neck - supple, no significant adenopathy Chest - clear to auscultation, no wheezes, rales or rhonchi, symmetric air entry Heart - normal rate, regular rhythm, normal S1, S2, no murmurs, rubs, clicks or gallops Abdomen - soft, nontender, nondistended, no masses or organomegaly Size = Dates Neurological - alert, oriented, normal speech, no focal findings  or movement disorder noted Extremities - peripheral pulses normal, no pedal edema, no clubbing or cyanosis Skin - normal coloration and turgor, no rashes, no suspicious skin lesions noted Focused Gynecological Exam: examination not indicated  Labs: CBC    Component Value Date/Time   WBC 10.9* 05/09/2011 1500   RBC 3.55* 05/09/2011 1500   HGB 9.6* 05/09/2011 1500   HCT 29.8* 05/09/2011 1500   PLT 263 05/09/2011 1500   MCV 83.9 05/09/2011 1500   MCH 27.0 05/09/2011 1500   MCHC 32.2 05/09/2011 1500   RDW 14.9 05/09/2011 1500   LYMPHSABS 1.3 02/27/2011 1316   MONOABS 0.3 02/27/2011 1316   EOSABS 0.1 02/27/2011 1316   BASOSABS 0.0 02/27/2011 1316    Assessment: Patient Active Problem List  Diagnoses  . Previous cesarean delivery, antepartum condition or complication   Undesired fertility  RLTCS and BTL.

## 2011-05-15 NOTE — Progress Notes (Signed)
Dr. Lilli Light aware of patients low HR.  Orders received for same.

## 2011-05-15 NOTE — Anesthesia Preprocedure Evaluation (Addendum)
Anesthesia Evaluation  Patient identified by MRN, date of birth, ID band Patient awake    Reviewed: Allergy & Precautions, H&P , NPO status , Patient's Chart, lab work & pertinent test results  Airway Mallampati: I TM Distance: >3 FB Neck ROM: full    Dental No notable dental hx.    Pulmonary neg pulmonary ROS,    Pulmonary exam normal       Cardiovascular neg cardio ROS     Neuro/Psych Negative Neurological ROS  Negative Psych ROS   GI/Hepatic Neg liver ROS, GERD-  Medicated and Controlled,  Endo/Other  Negative Endocrine ROS  Renal/GU negative Renal ROS  Genitourinary negative   Musculoskeletal negative musculoskeletal ROS (+)   Abdominal Normal abdominal exam  (+)   Peds negative pediatric ROS (+)  Hematology negative hematology ROS (+)   Anesthesia Other Findings   Reproductive/Obstetrics (+) Pregnancy                           Anesthesia Physical Anesthesia Plan  ASA: II  Anesthesia Plan: Spinal   Post-op Pain Management:    Induction:   Airway Management Planned:   Additional Equipment:   Intra-op Plan:   Post-operative Plan:   Informed Consent: I have reviewed the patients History and Physical, chart, labs and discussed the procedure including the risks, benefits and alternatives for the proposed anesthesia with the patient or authorized representative who has indicated his/her understanding and acceptance.     Plan Discussed with: CRNA  Anesthesia Plan Comments:        Anesthesia Quick Evaluation

## 2011-05-15 NOTE — Progress Notes (Signed)
Dr. Lilli Light aware of patients b/p in [facility].  No new orders received at this time.

## 2011-05-15 NOTE — Addendum Note (Signed)
Addendum  created 05/15/11 2331 by Rosalia Hammers   Modules edited:Notes Section

## 2011-05-15 NOTE — Anesthesia Procedure Notes (Signed)
Spinal  Patient location during procedure: OR Start time: 05/15/2011 8:55 AM End time: 05/15/2011 9:01 AM Staffing Anesthesiologist: Sandrea Hughs Performed by: anesthesiologist  Preanesthetic Checklist Completed: patient identified, site marked, surgical consent, pre-op evaluation, timeout performed, IV checked, risks and benefits discussed and monitors and equipment checked Spinal Block Patient position: sitting Prep: DuraPrep Patient monitoring: heart rate, cardiac monitor, continuous pulse ox and blood pressure Approach: midline Location: L3-4 Injection technique: single-shot Needle Needle type: Sprotte  Needle gauge: 24 G Needle length: 9 cm Assessment Sensory level: T4

## 2011-05-15 NOTE — Anesthesia Postprocedure Evaluation (Signed)
Anesthesia Post Note  Patient: Tanya Meza  Procedure(s) Performed:  CESAREAN SECTION WITH BILATERAL TUBAL LIGATION  Anesthesia type: Spinal  Patient location: PACU  Post pain: Pain level controlled  Post assessment: Post-op Vital signs reviewed  Last Vitals:  Filed Vitals:   05/15/11 1000  BP: 108/68  Pulse: 55  Temp:   Resp: 16    Post vital signs: Reviewed  Level of consciousness: awake  Complications: No apparent anesthesia complications

## 2011-05-15 NOTE — Op Note (Signed)
Tanya Meza PROCEDURE DATE: 05/15/2011  PREOPERATIVE DIAGNOSIS: Intrauterine pregnancy at [redacted]w[redacted]d weeks gestation; elective repeat cesarean section  POSTOPERATIVE DIAGNOSIS: The same  PROCEDURE: Repeat Low Transverse Cesarean Section  SURGEON:  Dr. Tinnie Gens  ASSISTANT: Dr Candelaria Celeste  INDICATIONS: Tanya Meza is a 30 y.o. [phone] at [redacted]w[redacted]d scheduled for cesarean section secondary to elective repeat cesarean section.  The risks of cesarean section discussed with the patient included but were not limited to: bleeding which may require transfusion or reoperation; infection which may require antibiotics; injury to bowel, bladder, ureters or other surrounding organs; injury to the fetus; need for additional procedures including hysterectomy in the event of a life-threatening hemorrhage; placental abnormalities wth subsequent pregnancies, incisional problems, thromboembolic phenomenon and other postoperative/anesthesia complications. The patient concurred with the proposed plan, giving informed written consent for the procedure.    FINDINGS:  Viable female infant in cephalic presentation.  Apgars 9 and 9, weight, 7 pounds and 7 ounces.  Clear amniotic fluid.  Intact placenta, three vessel cord.  Normal uterus, fallopian tubes and ovaries bilaterally.  ANESTHESIA:    Spinal INTRAVENOUS FLUIDS: 3700 ml ESTIMATED BLOOD LOSS: 700 ml URINE OUTPUT:  200 ml SPECIMENS: Placenta sent to L&D COMPLICATIONS: None immediate  PROCEDURE IN DETAIL:  The patient received intravenous antibiotics and had sequential compression devices applied to her lower extremities while in the preoperative area.  She was then taken to the operating room where spinal anesthesia was administered and was found to be adequate. She was then placed in a dorsal supine position with a leftward tilt, and prepped and draped in a sterile manner.  A foley catheter was placed into her bladder and attached to constant gravity, which  drained clear fluid throughout.  After an adequate timeout was performed, a Pfannenstiel skin incision was made with scalpel and carried through to the underlying layer of fascia. The fascia was incised in the midline and this incision was extended bilaterally using the Mayo scissors. Kocher clamps were applied to the superior aspect of the fascial incision and the underlying rectus muscles were dissected off bluntly.  The rectus muscles were separated in the midline bluntly.  Due to adhesions, a 0.5 cm incision was made into the rectus muscles with cautery to improve access into the peritoneum.  An Alexis retractor was placed into the abdominal cavity.  Attention was turned to the lower uterine segment, where a transverse hysterotomy was made with a scalpel and extended bilaterally bluntly.  The infant was successfully delivered, and cord was clamped and cut and infant was handed over to awaiting neonatology team. Uterine massage was then administered and the placenta delivered intact with three-vessel cord. The uterus was then cleared of clot and debris.  The hysterotomy was closed with 0 Vicryl in a running locked fashion. Overall, excellent hemostasis was noted.  Attention was then turned to the fallopian tubes.  A Filshie clip was placed on both tubes, about 2 cm from the cornua, with care given to incorporate the underlying mesosalpinx on both sides, allowing for bilateral tubal sterilization. The Alexis retractor was removed, the abdomen and the pelvis were cleared of all clot and debris. Hemostasis was confirmed on all surfaces.  The rectus muscles were reapproximated using 0 vicryl with interrupted stitches. The fascia was then closed using 0 Vicryl in a running fashion.  The skin was closed with staples. The patient tolerated the procedure well. Sponge, lap, instrument and needle counts were correct x 2. She was taken to  the recovery room in stable condition.    Sigmond Patalano JEHIEL DO 05/15/2011  10:00 AM

## 2011-05-16 LAB — CBC
HCT: 24.6 % — ABNORMAL LOW (ref 36.0–46.0)
Hemoglobin: 7.8 g/dL — ABNORMAL LOW (ref 12.0–15.0)
MCH: 26.4 pg (ref 26.0–34.0)
MCHC: 31.7 g/dL (ref 30.0–36.0)
MCV: 83.4 fL (ref 78.0–100.0)
Platelets: 199 10*3/uL (ref 150–400)
RBC: 2.95 MIL/uL — ABNORMAL LOW (ref 3.87–5.11)
RDW: 15.1 % (ref 11.5–15.5)
WBC: 8 10*3/uL (ref 4.0–10.5)

## 2011-05-16 LAB — CCBB MATERNAL DONOR DRAW

## 2011-05-16 NOTE — Progress Notes (Addendum)
Subjective: Postpartum Day #1: Cesarean Delivery Patient reports tolerating PO and no problems voiding; bottlefeeding  Objective: Vital signs in last 24 hours: Temp:  [97.4 F (36.3 C)-98.3 F (36.8 C)] 97.8 F (36.6 C) (11/28 0623) Pulse Rate:  [42-89] 56  (11/28 0623) Resp:  [16-20] 16  (11/28 0623) BP: (97-156)/(59-96) 97/59 mmHg (11/28 0623) SpO2:  [95 %-100 %] 96 % (11/28 0623) Weight:  [83.915 kg (185 lb)] 185 lb (83.915 kg) (11/27 1125)  Physical Exam:  General: alert and no distress Lochia: appropriate Uterine Fundus: firm Incision: dsg intact- clean & dry DVT Evaluation: No evidence of DVT seen on physical exam.   Basename 05/16/11 0525  HGB 7.8*  HCT 24.6*  predel hgb 9.6  Assessment/Plan: Status post Cesarean section. Doing well postoperatively.  Continue current care.  Cam Hai 05/16/2011, 7:20 AM

## 2011-05-17 NOTE — Progress Notes (Signed)
Post Partum Day 2 RLTCS and BTL Subjective: up ad lib, voiding, tolerating PO and + flatus  Objective: Blood pressure 135/82, pulse 64, temperature 97.5 F (36.4 C), temperature source Oral, resp. rate 20, weight 83.915 kg (185 lb), last menstrual period 08/17/2010, SpO2 96.00%, unknown if currently breastfeeding.  Physical Exam:  General: alert, cooperative and no distress Lochia: appropriate Uterine Fundus: firm Incision: healing well DVT Evaluation: No cords or calf tenderness. No significant calf/ankle edema.   Basename 05/16/11 0525  HGB 7.8*  HCT 24.6*    Assessment/Plan: Plan for discharge tomorrow and Contraception BTL. Bottle feeding.    LOS: 2 days   HUNTER, STEPHEN 05/17/2011, 9:22 AM

## 2011-05-18 MED ORDER — OXYCODONE-ACETAMINOPHEN 10-325 MG PO TABS: 1.0000 | ORAL_TABLET | ORAL | Status: AC | PRN

## 2011-05-18 MED ORDER — IBUPROFEN 600 MG PO TABS: 600.0000 mg | ORAL_TABLET | Freq: Four times a day (QID) | ORAL | Status: AC | PRN

## 2011-05-18 NOTE — Discharge Summary (Signed)
Obstetric Discharge Summary Reason for Admission: cesarean section Prenatal Procedures: NST and ultrasound Intrapartum Procedures: cesarean: low cervical, transverse and tubal ligation Postpartum Procedures: none Complications-Operative and Postpartum: none Hemoglobin  Date Value Range Status  05/16/2011 7.8* 12.0-15.0 (g/dL) Final     HCT  Date Value Range Status  05/16/2011 24.6* 36.0-46.0 (%) Final    Discharge Diagnoses: Term Pregnancy-delivered and POD # 3 s/p RLTCS and BTS  Discharge Information: Date: 05/18/2011 Activity: pelvic rest Diet: routine Medications: Ibuprofen and Percocet Condition: stable Instructions: refer to practice specific booklet Discharge to: home Follow-up Information    Follow up with [facility]. Call in 4 weeks.   Contact information:   [address] [place] [address]          Newborn Data: Live born female  Birth Weight: 7 lb 7.2 oz (3380 g) APGAR: 9, 9  Home with mother.  Tanya Nyquist E. 05/18/2011, 11:09 AM

## 2011-05-18 NOTE — Discharge Summary (Signed)
Chart reviewed and agree with management and plan.  

## 2011-06-15 ENCOUNTER — Inpatient Hospital Stay (HOSPITAL_COMMUNITY)
Admission: AD | Admit: 2011-06-15 | Discharge: 2011-06-16 | Disposition: A | Discharge status: Home / Self Care | Payer: 59 | Source: Ambulatory Visit | Attending: Obstetrics & Gynecology | Admitting: Obstetrics & Gynecology

## 2011-06-15 ENCOUNTER — Encounter (HOSPITAL_COMMUNITY): Payer: Self-pay | Admitting: *Deleted

## 2011-06-15 ENCOUNTER — Inpatient Hospital Stay (HOSPITAL_COMMUNITY): Payer: 59

## 2011-06-15 DIAGNOSIS — O99893 Other specified diseases and conditions complicating puerperium: Secondary | ICD-10-CM | POA: Insufficient documentation

## 2011-06-15 DIAGNOSIS — R109 Unspecified abdominal pain: Secondary | ICD-10-CM | POA: Insufficient documentation

## 2011-06-15 HISTORY — DX: Adverse effect of unspecified anesthetic, initial encounter: T41.45XA

## 2011-06-15 HISTORY — DX: Other complications of anesthesia, initial encounter: T88.59XA

## 2011-06-15 LAB — DIFFERENTIAL
Basophils Relative: 0 % (ref 0–1)
Eosinophils Absolute: 0.4 10*3/uL (ref 0.0–0.7)
Eosinophils Relative: 4 % (ref 0–5)
Monocytes Relative: 7 % (ref 3–12)
Neutrophils Relative %: 63 % (ref 43–77)

## 2011-06-15 LAB — CBC
MCH: 26.4 pg (ref 26.0–34.0)
MCHC: 32.1 g/dL (ref 30.0–36.0)
MCV: 82.3 fL (ref 78.0–100.0)
Platelets: 309 10*3/uL (ref 150–400)

## 2011-06-15 NOTE — ED Provider Notes (Signed)
History     Chief Complaint  Patient presents with  . Post-op Problem   HPI This is a 30 year old female who is approximately 4 weeks postpartum from repeat low transverse cesarean section who presents to the MAU with left lower quadrant mass and moderate pain above the incision site.  She noticed both the mass and the pain earlier today.  No radiation of the pain.  She has had no complications of the incision and denies discharge, dehiscence, erythema of the wound.  She also denies fevers, chills, nausea, vomiting.  OB History    Grav Para Term Preterm Abortions TAB SAB Ect Mult Living   3 3 2 1  0 0 0 0 0 3      Past Medical History  Diagnosis Date  . PCOS (polycystic ovarian syndrome)   . Tachycardia     RVOF  . Nephrolithiasis     no surgery required  . GERD (gastroesophageal reflux disease)     with preg, on zantac  . Complication of anesthesia     Past Surgical History  Procedure Date  . Cesarean section 2005, 2008    x 2  . Wisdom tooth extraction     Family History  Problem Relation Age of Onset  . Thyroid disease Mother   . Diabetes Father   . Anesthesia problems Neg Hx     History  Substance Use Topics  . Smoking status: Never Smoker   . Smokeless tobacco: Never Used  . Alcohol Use: Yes     none with pregnancy    Allergies:  Allergies  Allergen Reactions  . Latex Swelling    Swelling- around catheter    Prescriptions prior to admission  Medication Sig Dispense Refill  . ibuprofen (ADVIL,MOTRIN) 600 MG tablet Take 600 mg by mouth every 6 (six) hours as needed. For pain       . oxyCODONE-acetaminophen (PERCOCET) 10-325 MG per tablet Take 1 tablet by mouth every 4 (four) hours as needed. For severe pain         Review of Systems  All other systems reviewed and are negative.   Physical Exam   Blood pressure 122/60, pulse 76, temperature 98.8 F (37.1 C), temperature source Oral, resp. rate 18, height 5' 4.5" (1.638 m), weight 73.596 kg (162  lb 4 oz), not currently breastfeeding.  Physical Exam  Constitutional: She appears well-developed and well-nourished.  HENT:  Head: Normocephalic.  Eyes: Pupils are equal, round, and reactive to light.  Cardiovascular: Normal rate.   Respiratory: Effort normal.  GI: Soft. Bowel sounds are normal. She exhibits no distension and no mass. There is no tenderness. There is no rebound and no guarding.       2 cm tender, nonflucuant palpable mass on the left abdominal wall approximately 1.5cm above the incision.   Lab Results  Component Value Date   WBC 10.1 06/15/2011   HGB 12.1 06/15/2011   HCT 37.7 06/15/2011   MCV 82.3 06/15/2011   PLT 309 06/15/2011   [place] results:   Findings: Mild edema in the subcutaneous tissues of the abdominal  wall in the region of interest. No abnormal fluid collection to  suggest hematoma or abscess. Acoustic shadowing related to the  surgical scar.  IMPRESSION:  Expected postoperative changes with edema in the subcutaneous  tissues. No evidence of abscess or hematoma.   MAU Course  Procedures   Assessment and Plan  1.  Abdominal wall pain  No collection of fluid or  abscess causing pain.  I question whether this is related to her c-section.  Encouraged heat, NSAIDS.  Watch area.  Has postpartum appointment in about 1 week.  She will follow up sooner if any complications arise.  Ugochukwu Chichester JEHIEL 06/15/2011, 11:29 PM

## 2011-06-15 NOTE — Progress Notes (Signed)
Pt states, " I have a knot in the left side of my incision and a dull> sharp pain when I move in that area. ( C/S Nov 27)

## 2011-07-02 ENCOUNTER — Encounter: Payer: Self-pay | Admitting: Family Medicine

## 2011-07-02 ENCOUNTER — Ambulatory Visit (INDEPENDENT_AMBULATORY_CARE_PROVIDER_SITE_OTHER): Payer: 59 | Admitting: Family Medicine

## 2011-07-02 NOTE — Progress Notes (Signed)
  Subjective:    Patient ID: Tanya Meza, female    DOB: 06/06/1981, 31 y.o.   MRN: [id number]  HPI Here for pp check.  Bottle feeding.  Feels she is well-healed from C-Section.  Ready to go back to work.  Daughter is sleeping some through the night.  She has a score of 3 on the pp depression scale.   Review of Systems  Constitutional: Negative for fever.  Gastrointestinal: Negative for anal bleeding.  Genitourinary: Negative for dysuria.  Musculoskeletal: Negative for back pain.  Neurological: Negative for headaches.       Objective:   Physical Exam  Vitals reviewed. Constitutional: She appears well-developed and well-nourished.  HENT:  Head: Normocephalic and atraumatic.  Neck: Normal range of motion.  Cardiovascular: Normal rate.   Pulmonary/Chest: Effort normal.  Abdominal: Soft.       Incision is well healed.          Assessment & Plan:  PP check Doing well-- Ok to return to nml activity. F/u in 1 year for pap.

## 2011-07-02 NOTE — Patient Instructions (Signed)
Postpartum Tubal Ligation A postpartum tubal ligation (PPTL) is when the fallopian tubes are tied after a pregnancy. The fallopian tubes carry the eggs from the ovary to the uterus. A PPTL procedure is done to permanently prevent pregnancy (sterilization). Although this procedure may be reversed, it should be considered permanent and irreversible. This means they cannot be repaired. You should consider that you will never have children again.  This decision should be thought over carefully and discussed with your partner. Discuss it while you are pregnant in order to make a more sound and intelligent decision, rather than waiting until the baby is born. It may be good to wait until a day or two after the birth to make sure everything is all right with the baby. You must be absolutely sure you do not want another pregnancy. PPTL should be delayed if any medical problems develop during labor and delivery with the mother or the baby. RISKS AND COMPLICATIONS  Infection. A germ starts growing in the wound. This can usually be treated with antibiotics.   Fever.   Bleeding is a complication of almost all surgeries. However, it is not common following this surgery.   Pregnancy. This may happen when the body repairs itself and the tubes or one of the tubes is again able to transport an egg to the uterus. This may also occur as the result of a surgical failure. All surgeries, regardless of how perfectly they are done, are not always successful with perfect results.   Tubal pregnancy. A tubal pregnancy may occur following a tubal ligation when the tube has repaired enough to transport an egg. Because the tube has been damaged by surgery, it is more likely that the fertilized egg can implant in the tube. A tubal pregnancy can be life-threatening.   Injury to surrounding organs and blood vessels.   There is an increase incidence of hysterectomy later in life. The reason is unknown.   Regret is a late  complication. You may wish to carry another pregnancy again. Chances of this can be lessened by careful decision making before the procedure. All possibilities should be thought of. This includes the things you do not like to think about such as divorce, loss of your spouse, death or loss of your children.  Women who regret having a tubal ligation and wish to become pregnant again have a couple of choices that include:  Reversing the tubal ligation, untying and connecting the tubes again. However:   There is a higher risk of a tubal pregnancy.   It is not always successful.   In vitro fertilization. However, it is:   Very complicated and demanding on the patient.   Not always successful.  PROCEDURE  The PPTL is a surgical procedure. This procedure can be safely performed right after delivery or the day after delivery.   If it is done following a vaginal delivery, it can be done through a small cut (incision) just beneath the belly button. A medicine will be used that numbs the area or puts you to sleep (anesthetic).   If a caesarean section is performed, the decision on whether to have a tubal ligation is usually made before the surgery. This is so the tubal ligation may be done at the same time, after delivery of the baby.   The fallopian tubes are tied off with 2 stitches (sutures).   A small piece of the tube is removed and then looked at under a microscope to make sure it is   the tube.  Different procedures are available for performing the surgery on the tubes. Your surgeon will discuss the pros and cons for PPTL. A PPTL does not require a longer hospital stay. Document Released: 06/04/2005 Document Revised: 02/14/2011 Document Reviewed: 09/22/2008 [employer] Patient Information 2012 [employer]. 

## 2011-07-23 ENCOUNTER — Other Ambulatory Visit (INDEPENDENT_AMBULATORY_CARE_PROVIDER_SITE_OTHER): Payer: 59 | Admitting: *Deleted

## 2011-07-23 DIAGNOSIS — Z111 Encounter for screening for respiratory tuberculosis: Secondary | ICD-10-CM

## 2011-07-23 NOTE — Progress Notes (Signed)
Patient is here for TB test for work.  TB test was placed in her right fore arm.

## 2011-07-25 ENCOUNTER — Other Ambulatory Visit: Payer: 59

## 2011-07-25 DIAGNOSIS — Z111 Encounter for screening for respiratory tuberculosis: Secondary | ICD-10-CM

## 2011-11-26 ENCOUNTER — Telehealth: Payer: Self-pay

## 2011-11-26 NOTE — Telephone Encounter (Signed)
Patient called needing something called in for yeast infection. Called in some Dyflucan 150 mg #2 with no refills.

## 2011-12-11 ENCOUNTER — Encounter: Payer: Self-pay | Admitting: Obstetrics & Gynecology

## 2011-12-11 ENCOUNTER — Ambulatory Visit (INDEPENDENT_AMBULATORY_CARE_PROVIDER_SITE_OTHER): Payer: 59 | Admitting: Obstetrics & Gynecology

## 2011-12-11 VITALS — BP 103/68 | HR 74 | Wt 162.0 lb

## 2011-12-11 DIAGNOSIS — N899 Noninflammatory disorder of vagina, unspecified: Secondary | ICD-10-CM

## 2011-12-11 DIAGNOSIS — N898 Other specified noninflammatory disorders of vagina: Secondary | ICD-10-CM

## 2011-12-11 NOTE — Progress Notes (Signed)
  Subjective:    Patient ID: Tanya Meza, female    DOB: 22-Nov-1980, 31 y.o.   MRN: [id number]  HPI 31 yo MW lady 7 months post partum who comes in with a month h/o vulvar irritation- comes and goes. She last waxed [place]) prior to delivery and occcasionally shaves/clips. She wears pads as opposed to tampons. She says the "rash" is clearing up today. She also complains of some new onset vaginal dryness with sex. (bottlefeeding and no OCPs)  Review of Systems Pap due    Objective:   Physical Exam  2 small ulcer like areas at the lower most part of labia majora. Normal post mentrual discharge with speculum exam      Assessment & Plan:  Vulvar lesions- check HSV 2 IgG Vaginal dryness with sex- [place]

## 2011-12-12 LAB — WET PREP, GENITAL: Trich, Wet Prep: NONE SEEN

## 2011-12-17 ENCOUNTER — Other Ambulatory Visit (INDEPENDENT_AMBULATORY_CARE_PROVIDER_SITE_OTHER): Payer: 59 | Admitting: Gynecology

## 2011-12-17 DIAGNOSIS — L293 Anogenital pruritus, unspecified: Secondary | ICD-10-CM

## 2011-12-17 DIAGNOSIS — N898 Other specified noninflammatory disorders of vagina: Secondary | ICD-10-CM

## 2011-12-18 LAB — HSV 2 ANTIBODY, IGG: HSV 2 Glycoprotein G Ab, IgG: <0.1 IV

## 2012-03-07 ENCOUNTER — Encounter (HOSPITAL_COMMUNITY): Payer: Self-pay | Admitting: *Deleted

## 2012-03-07 ENCOUNTER — Inpatient Hospital Stay (HOSPITAL_COMMUNITY)
Admission: AD | Admit: 2012-03-07 | Discharge: 2012-03-07 | Disposition: A | Discharge status: Home / Self Care | Payer: 59 | Source: Ambulatory Visit | Attending: Obstetrics and Gynecology | Admitting: Obstetrics and Gynecology

## 2012-03-07 DIAGNOSIS — R109 Unspecified abdominal pain: Secondary | ICD-10-CM | POA: Insufficient documentation

## 2012-03-07 DIAGNOSIS — E860 Dehydration: Secondary | ICD-10-CM | POA: Insufficient documentation

## 2012-03-07 DIAGNOSIS — N201 Calculus of ureter: Secondary | ICD-10-CM

## 2012-03-07 LAB — CBC
Hemoglobin: 12.6 g/dL (ref 12.0–15.0)
MCH: 27.3 pg (ref 26.0–34.0)
MCHC: 32.2 g/dL (ref 30.0–36.0)
Platelets: 286 10*3/uL (ref 150–400)
RDW: 14.9 % (ref 11.5–15.5)

## 2012-03-07 LAB — COMPREHENSIVE METABOLIC PANEL
ALT: 10 U/L (ref 0–35)
AST: 15 U/L (ref 0–37)
Albumin: 4.4 g/dL (ref 3.5–5.2)
Alkaline Phosphatase: 56 U/L (ref 39–117)
Calcium: 9.7 mg/dL (ref 8.4–10.5)
GFR calc Af Amer: >90 mL/min (ref >90)
Glucose, Bld: 99 mg/dL (ref 70–99)
Potassium: 4.2 mEq/L (ref 3.5–5.1)
Sodium: 139 mEq/L (ref 135–145)
Total Protein: 7.4 g/dL (ref 6.0–8.3)

## 2012-03-07 LAB — URINALYSIS, ROUTINE W REFLEX MICROSCOPIC
Glucose, UA: NEGATIVE mg/dL
Ketones, ur: 15 mg/dL — AB
Leukocytes, UA: NEGATIVE
Nitrite: NEGATIVE
Specific Gravity, Urine: >1.03 — ABNORMAL HIGH (ref 1.005–1.030)
pH: 6 (ref 5.0–8.0)

## 2012-03-07 LAB — POCT PREGNANCY, URINE: Preg Test, Ur: NEGATIVE

## 2012-03-07 LAB — URINE MICROSCOPIC-ADD ON

## 2012-03-07 MED ORDER — HYDROMORPHONE HCL PF 1 MG/ML IJ SOLN
INTRAMUSCULAR | Status: AC
  Administered 2012-03-07: 1 mg via INTRAVENOUS
  Filled 2012-03-07: qty 1

## 2012-03-07 MED ORDER — HYDROMORPHONE HCL PF 1 MG/ML IJ SOLN
1.0000 mg | Freq: Once | INTRAMUSCULAR | Status: AC
  Administered 2012-03-07: 1 mg via INTRAVENOUS

## 2012-03-07 MED ORDER — SODIUM CHLORIDE 0.9 % IV SOLN
INTRAVENOUS | Status: DC
  Administered 2012-03-07: 11:00:00 via INTRAVENOUS

## 2012-03-07 MED ORDER — KETOROLAC TROMETHAMINE 30 MG/ML IJ SOLN
30.0000 mg | Freq: Once | INTRAMUSCULAR | Status: AC
  Administered 2012-03-07: 30 mg via INTRAVENOUS
  Filled 2012-03-07: qty 1

## 2012-03-07 MED ORDER — ONDANSETRON HCL 4 MG/2ML IJ SOLN
4.0000 mg | Freq: Once | INTRAMUSCULAR | Status: AC
  Administered 2012-03-07: 4 mg via INTRAVENOUS
  Filled 2012-03-07: qty 2

## 2012-03-07 MED ORDER — OXYCODONE-ACETAMINOPHEN 5-325 MG PO TABS: 1.0000 | ORAL_TABLET | ORAL | Status: DC | PRN

## 2012-03-07 MED ORDER — IBUPROFEN 600 MG PO TABS: 600.0000 mg | ORAL_TABLET | Freq: Four times a day (QID) | ORAL | Status: DC | PRN

## 2012-03-07 NOTE — MAU Note (Signed)
Pt in c/o severe left flank pain,  States she has a hx of kidney stones and has had an ultrasound that showed numerous kidneys stones on both sides.  States pain started 2 hours ago.

## 2012-03-07 NOTE — MAU Provider Note (Signed)
History     CSN: [id number]  Arrival date and time: 03/07/12 1018   First Provider Initiated Contact with Patient 03/07/12 1116      Chief Complaint  Patient presents with  . Abdominal Pain   HPI  Pt is not pregnant and presents with left sided pain with onset about 2 hours ago.  Pt has known multiple kidney stones previously diagnosed by ultrasound.  Pt is in tears with intense pain and curled up.  Past Medical History  Diagnosis Date  . PCOS (polycystic ovarian syndrome)   . Tachycardia     RVOF  . Nephrolithiasis     no surgery required  . GERD (gastroesophageal reflux disease)     with preg, on zantac  . Complication of anesthesia     Past Surgical History  Procedure Date  . Cesarean section 2005, 2008    x 2  . Wisdom tooth extraction     Family History  Problem Relation Age of Onset  . Thyroid disease Mother   . Diabetes Father   . Anesthesia problems Neg Hx     History  Substance Use Topics  . Smoking status: Never Smoker   . Smokeless tobacco: Never Used  . Alcohol Use: Yes     social    Allergies:  Allergies  Allergen Reactions  . Latex Swelling    Swelling- around catheter    Prescriptions prior to admission  Medication Sig Dispense Refill  . ibuprofen (ADVIL,MOTRIN) 600 MG tablet Take 600 mg by mouth every 6 (six) hours as needed. For pain         Review of Systems  Gastrointestinal: Positive for abdominal pain. Negative for nausea and vomiting.   Physical Exam   Blood pressure 136/111, pulse 102, temperature 97.8 F (36.6 C), temperature source Oral, resp. rate 24, height 5\' 4"  (1.626 m), weight 67.586 kg (149 lb), last menstrual period 02/05/2012.  Physical Exam  Vitals reviewed. Constitutional: She is oriented to person, place, and time. She appears well-developed and well-nourished. She appears distressed.  HENT:  Head: Normocephalic.  Eyes: Pupils are equal, round, and reactive to light.  Neck: Normal range of motion. Neck  supple.  Cardiovascular:       Hypertensive with pain  Respiratory: Effort normal and breath sounds normal.  GI: There is tenderness.       Left flank pain  Musculoskeletal: Normal range of motion.  Neurological: She is alert and oriented to person, place, and time.  Skin: Skin is warm.  Psychiatric: She has a normal mood and affect.    MAU Course  Procedures IVF NS with Dilaudid 1 mg Toradol 30mg  and Zofran IV given with great decrease in pain Care turned over to Deeann Cree, CNM Pain resolved after analgesics and1000 cc infused Results for orders placed during the hospital encounter of 03/07/12 (from the past 24 hour(s))  POCT PREGNANCY, URINE     Status: Normal   Collection Time   03/07/12 11:05 AM      Component Value Range   Preg Test, Ur NEGATIVE  NEGATIVE  URINALYSIS, ROUTINE W REFLEX MICROSCOPIC     Status: Abnormal   Collection Time   03/07/12 11:10 AM      Component Value Range   Color, Urine YELLOW  YELLOW   APPearance CLEAR  CLEAR   Specific Gravity, Urine >1.030 (*) 1.005 - 1.030   pH 6.0  5.0 - 8.0   Glucose, UA NEGATIVE  NEGATIVE mg/dL   Hgb urine  dipstick LARGE (*) NEGATIVE   Bilirubin Urine NEGATIVE  NEGATIVE   Ketones, ur 15 (*) NEGATIVE mg/dL   Protein, ur NEGATIVE  NEGATIVE mg/dL   Urobilinogen, UA 1.0  0.0 - 1.0 mg/dL   Nitrite NEGATIVE  NEGATIVE   Leukocytes, UA NEGATIVE  NEGATIVE  URINE MICROSCOPIC-ADD ON     Status: Abnormal   Collection Time   03/07/12 11:10 AM      Component Value Range   Squamous Epithelial / LPF MANY (*) RARE   RBC / HPF 21-50  <3 RBC/hpf   Bacteria, UA RARE  RARE  CBC     Status: Normal   Collection Time   03/07/12 11:20 AM      Component Value Range   WBC 5.0  4.0 - 10.5 K/uL   RBC 4.61  3.87 - 5.11 MIL/uL   Hemoglobin 12.6  12.0 - 15.0 g/dL   HCT 16.1  09.6 - 04.5 %   MCV 84.8  78.0 - 100.0 fL   MCH 27.3  26.0 - 34.0 pg   MCHC 32.2  30.0 - 36.0 g/dL   RDW 40.9  81.1 - 91.4 %   Platelets 286  150 - 400 K/uL    COMPREHENSIVE METABOLIC PANEL     Status: Normal   Collection Time   03/07/12 11:20 AM      Component Value Range   Sodium 139  135 - 145 mEq/L   Potassium 4.2  3.5 - 5.1 mEq/L   Chloride 102  96 - 112 mEq/L   CO2 24  19 - 32 mEq/L   Glucose, Bld 99  70 - 99 mg/dL   BUN 15  6 - 23 mg/dL   Creatinine, Ser 7.82  0.50 - 1.10 mg/dL   Calcium 9.7  8.4 - 95.6 mg/dL   Total Protein 7.4  6.0 - 8.3 g/dL   Albumin 4.4  3.5 - 5.2 g/dL   AST 15  0 - 37 U/L   ALT 10  0 - 35 U/L   Alkaline Phosphatase 56  39 - 117 U/L   Total Bilirubin 0.4  0.3 - 1.2 mg/dL   GFR calc non Af Amer >90  >90 mL/min   GFR calc Af Amer >90  >90 mL/min   Assessment and Plan  Ureteral calculi Dehydration Discharge with ibuprofen 600mg  q6h and Percocet for breakthrough pain. Force fluids. May want to strain urine (prior stone was Ca++) Plans to follow up with urologist who her mother knows.   LINEBERRY,SUSAN 03/07/2012, 11:20 AM  Danae Orleans, CNM 03/07/2012 1:40 PM

## 2012-03-07 NOTE — MAU Note (Addendum)
Lower abd pain (left ) started about .  Hx of kidney stones.  Did not call MD. Getting steadily worse, radiating to back.  Pt in tears. Urge to pee, no pain associated

## 2012-03-10 NOTE — MAU Provider Note (Signed)
Attestation of Attending Supervision of Advanced Practitioner (CNM/NP): Evaluation and management procedures were performed by the Advanced Practitioner under my supervision and collaboration.  I have reviewed the Advanced Practitioner's note and chart, and I agree with the management and plan.  Oletta Buehring 03/10/2012 9:01 AM

## 2012-07-17 ENCOUNTER — Other Ambulatory Visit: Payer: Self-pay

## 2012-08-05 ENCOUNTER — Encounter: Payer: Self-pay | Admitting: Family Medicine

## 2012-08-05 ENCOUNTER — Ambulatory Visit (INDEPENDENT_AMBULATORY_CARE_PROVIDER_SITE_OTHER): Payer: 59 | Admitting: Family Medicine

## 2012-08-05 VITALS — BP 112/72 | HR 72 | Ht 64.5 in | Wt 164.0 lb

## 2012-08-05 DIAGNOSIS — Z1151 Encounter for screening for human papillomavirus (HPV): Secondary | ICD-10-CM

## 2012-08-05 DIAGNOSIS — Z124 Encounter for screening for malignant neoplasm of cervix: Secondary | ICD-10-CM

## 2012-08-05 DIAGNOSIS — Z01419 Encounter for gynecological examination (general) (routine) without abnormal findings: Secondary | ICD-10-CM

## 2012-08-05 DIAGNOSIS — Z23 Encounter for immunization: Secondary | ICD-10-CM

## 2012-08-05 DIAGNOSIS — E663 Overweight: Secondary | ICD-10-CM

## 2012-08-05 LAB — CBC
HCT: 37.6 % (ref 36.0–46.0)
MCHC: 33.5 g/dL (ref 30.0–36.0)
Platelets: 283 10*3/uL (ref 150–400)
RDW: 14.5 % (ref 11.5–15.5)
WBC: 6.1 10*3/uL (ref 4.0–10.5)

## 2012-08-05 LAB — LIPID PANEL
HDL: 71 mg/dL (ref >39)
Total CHOL/HDL Ratio: 2.4 Ratio
VLDL: 14 mg/dL (ref 0–40)

## 2012-08-05 LAB — TSH: TSH: 1.736 u[IU]/mL (ref 0.350–4.500)

## 2012-08-05 LAB — COMPREHENSIVE METABOLIC PANEL
ALT: 13 U/L (ref 0–35)
AST: 16 U/L (ref 0–37)
Albumin: 4.4 g/dL (ref 3.5–5.2)
Alkaline Phosphatase: 52 U/L (ref 39–117)
BUN: 16 mg/dL (ref 6–23)
Chloride: 100 mEq/L (ref 96–112)
Potassium: 4.6 mEq/L (ref 3.5–5.3)

## 2012-08-05 MED ORDER — INFLUENZA VIRUS VACC SPLIT PF IM SUSP: 0.5000 mL | Freq: Once | INTRAMUSCULAR | Status: DC

## 2012-08-05 MED ORDER — PHENTERMINE HCL 37.5 MG PO CAPS: 37.5000 mg | ORAL_CAPSULE | ORAL | Status: DC

## 2012-08-05 NOTE — Progress Notes (Signed)
  Subjective:     Tanya Meza is a 32 y.o. female and is here for a comprehensive physical exam. The patient reports no problems.  S/p BTL with last C-section.  Cycles are irregular but not heavy.  Complains of some chafing with intercourse, ? Related to new hair growth.  History   Social History  . Marital Status: Married    Spouse Name: N/A    Number of Children: N/A  . Years of Education: N/A   Occupational History  . Not on file.   Social History Main Topics  . Smoking status: Never Smoker   . Smokeless tobacco: Never Used  . Alcohol Use: Yes     Comment: social  . Drug Use: No  . Sexually Active: Yes -- Female partner(s)    Birth Control/ Protection: Surgical     Comment: tubaligation.   Other Topics Concern  . Not on file   Social History Narrative  . No narrative on file   Health Maintenance  Topic Date Due  . Influenza Vaccine  02/17/2012  . Pap Smear  07/04/2013  . Tetanus/tdap  05/15/2021    The following portions of the patient's history were reviewed and updated as appropriate: allergies, current medications, past family history, past medical history, past social history, past surgical history and problem list.  Review of Systems A comprehensive review of systems was negative.   Objective:    BP 112/72  Pulse 72  Ht 5' 4.5" (1.638 m)  Wt 164 lb (74.39 kg)  BMI 27.73 kg/m2  LMP 08/01/2012  Breastfeeding? No General appearance: alert, cooperative and appears stated age Head: Normocephalic, without obvious abnormality, atraumatic Neck: no adenopathy, supple, symmetrical, trachea midline and thyroid not enlarged, symmetric, no tenderness/mass/nodules Lungs: clear to auscultation bilaterally Breasts: normal appearance, no masses or tenderness Heart: regular rate and rhythm, S1, S2 normal, no murmur, click, rub or gallop Abdomen: soft, non-tender; bowel sounds normal; no masses,  no organomegaly Pelvic: cervix normal in appearance, external genitalia  normal, no adnexal masses or tenderness, no cervical motion tenderness, uterus normal size, shape, and consistency and vagina normal without discharge Extremities: extremities normal, atraumatic, no cyanosis or edema Pulses: 2+ and symmetric Skin: Skin color, texture, turgor normal. No rashes or lesions Lymph nodes: Cervical, supraclavicular, and axillary nodes normal. Neurologic: Grossly normal    Assessment:    Healthy female exam.      Plan:  Flu shot Annual labs Pap smear.   See After Visit Summary for Counseling Recommendations

## 2012-08-05 NOTE — Patient Instructions (Addendum)
Preventive Care for Adults, Female A healthy lifestyle and preventive care can promote health and wellness. Preventive health guidelines for women include the following key practices.  A routine yearly physical is a good way to check with your caregiver about your health and preventive screening. It is a chance to share any concerns and updates on your health, and to receive a thorough exam.  Visit your dentist for a routine exam and preventive care every 6 months. Brush your teeth twice a day and floss once a day. Good oral hygiene prevents tooth decay and gum disease.  The frequency of eye exams is based on your age, health, family medical history, use of contact lenses, and other factors. Follow your caregiver's recommendations for frequency of eye exams.  Eat a healthy diet. Foods like vegetables, fruits, whole grains, low-fat dairy products, and lean protein foods contain the nutrients you need without too many calories. Decrease your intake of foods high in solid fats, added sugars, and salt. Eat the right amount of calories for you.Get information about a proper diet from your caregiver, if necessary.  Regular physical exercise is one of the most important things you can do for your health. Most adults should get at least 150 minutes of moderate-intensity exercise (any activity that increases your heart rate and causes you to sweat) each week. In addition, most adults need muscle-strengthening exercises on 2 or more days a week.  Maintain a healthy weight. The body mass index (BMI) is a screening tool to identify possible weight problems. It provides an estimate of body fat based on height and weight. Your caregiver can help determine your BMI, and can help you achieve or maintain a healthy weight.For adults 20 years and older:  A BMI below 18.5 is considered underweight.  A BMI of 18.5 to 24.9 is normal.  A BMI of 25 to 29.9 is considered overweight.  A BMI of 30 and above is  considered obese.  Maintain normal blood lipids and cholesterol levels by exercising and minimizing your intake of saturated fat. Eat a balanced diet with plenty of fruit and vegetables. Blood tests for lipids and cholesterol should begin at age 20 and be repeated every 5 years. If your lipid or cholesterol levels are high, you are over 50, or you are at high risk for heart disease, you may need your cholesterol levels checked more frequently.Ongoing high lipid and cholesterol levels should be treated with medicines if diet and exercise are not effective.  If you smoke, find out from your caregiver how to quit. If you do not use tobacco, do not start.  If you are pregnant, do not drink alcohol. If you are breastfeeding, be very cautious about drinking alcohol. If you are not pregnant and choose to drink alcohol, do not exceed 1 drink per day. One drink is considered to be 12 ounces (355 mL) of beer, 5 ounces (148 mL) of wine, or 1.5 ounces (44 mL) of liquor.  Avoid use of street drugs. Do not share needles with anyone. Ask for help if you need support or instructions about stopping the use of drugs.  High blood pressure causes heart disease and increases the risk of stroke. Your blood pressure should be checked at least every 1 to 2 years. Ongoing high blood pressure should be treated with medicines if weight loss and exercise are not effective.  If you are 55 to 34 years old, ask your caregiver if you should take aspirin to prevent strokes.  Diabetes   screening involves taking a blood sample to check your fasting blood sugar level. This should be done once every 3 years, after age 45, if you are within normal weight and without risk factors for diabetes. Testing should be considered at a younger age or be carried out more frequently if you are overweight and have at least 1 risk factor for diabetes.  Breast cancer screening is essential preventive care for women. You should practice "breast  self-awareness." This means understanding the normal appearance and feel of your breasts and may include breast self-examination. Any changes detected, no matter how small, should be reported to a caregiver. Women in their 20s and 30s should have a clinical breast exam (CBE) by a caregiver as part of a regular health exam every 1 to 3 years. After age 40, women should have a CBE every year. Starting at age 40, women should consider having a mammography (breast X-ray test) every year. Women who have a family history of breast cancer should talk to their caregiver about genetic screening. Women at a high risk of breast cancer should talk to their caregivers about having magnetic resonance imaging (MRI) and a mammography every year.  The Pap test is a screening test for cervical cancer. A Pap test can show cell changes on the cervix that might become cervical cancer if left untreated. A Pap test is a procedure in which cells are obtained and examined from the lower end of the uterus (cervix).  Women should have a Pap test starting at age 21.  Between ages 21 and 29, Pap tests should be repeated every 2 years.  Beginning at age 30, you should have a Pap test every 3 years as long as the past 3 Pap tests have been normal.  Some women have medical problems that increase the chance of getting cervical cancer. Talk to your caregiver about these problems. It is especially important to talk to your caregiver if a new problem develops soon after your last Pap test. In these cases, your caregiver may recommend more frequent screening and Pap tests.  The above recommendations are the same for women who have or have not gotten the vaccine for human papillomavirus (HPV).  If you had a hysterectomy for a problem that was not cancer or a condition that could lead to cancer, then you no longer need Pap tests. Even if you no longer need a Pap test, a regular exam is a good idea to make sure no other problems are  starting.  If you are between ages 65 and 70, and you have had normal Pap tests going back 10 years, you no longer need Pap tests. Even if you no longer need a Pap test, a regular exam is a good idea to make sure no other problems are starting.  If you have had past treatment for cervical cancer or a condition that could lead to cancer, you need Pap tests and screening for cancer for at least 20 years after your treatment.  If Pap tests have been discontinued, risk factors (such as a new sexual partner) need to be reassessed to determine if screening should be resumed.  The HPV test is an additional test that may be used for cervical cancer screening. The HPV test looks for the virus that can cause the cell changes on the cervix. The cells collected during the Pap test can be tested for HPV. The HPV test could be used to screen women aged 30 years and older, and should   be used in women of any age who have unclear Pap test results. After the age of 30, women should have HPV testing at the same frequency as a Pap test.  Colorectal cancer can be detected and often prevented. Most routine colorectal cancer screening begins at the age of 50 and continues through age 75. However, your caregiver may recommend screening at an earlier age if you have risk factors for colon cancer. On a yearly basis, your caregiver may provide home test kits to check for hidden blood in the stool. Use of a small camera at the end of a tube, to directly examine the colon (sigmoidoscopy or colonoscopy), can detect the earliest forms of colorectal cancer. Talk to your caregiver about this at age 50, when routine screening begins. Direct examination of the colon should be repeated every 5 to 10 years through age 75, unless early forms of pre-cancerous polyps or small growths are found.  Hepatitis C blood testing is recommended for all people born from 1945 through 1965 and any individual with known risks for hepatitis C.  Practice  safe sex. Use condoms and avoid high-risk sexual practices to reduce the spread of sexually transmitted infections (STIs). STIs include gonorrhea, chlamydia, syphilis, trichomonas, herpes, HPV, and human immunodeficiency virus (HIV). Herpes, HIV, and HPV are viral illnesses that have no cure. They can result in disability, cancer, and death. Sexually active women aged 25 and younger should be checked for chlamydia. Older women with new or multiple partners should also be tested for chlamydia. Testing for other STIs is recommended if you are sexually active and at increased risk.  Osteoporosis is a disease in which the bones lose minerals and strength with aging. This can result in serious bone fractures. The risk of osteoporosis can be identified using a bone density scan. Women ages 65 and over and women at risk for fractures or osteoporosis should discuss screening with their caregivers. Ask your caregiver whether you should take a calcium supplement or vitamin D to reduce the rate of osteoporosis.  Menopause can be associated with physical symptoms and risks. Hormone replacement therapy is available to decrease symptoms and risks. You should talk to your caregiver about whether hormone replacement therapy is right for you.  Use sunscreen with sun protection factor (SPF) of 30 or more. Apply sunscreen liberally and repeatedly throughout the day. You should seek shade when your shadow is shorter than you. Protect yourself by wearing long sleeves, pants, a wide-brimmed hat, and sunglasses year round, whenever you are outdoors.  Once a month, do a whole body skin exam, using a mirror to look at the skin on your back. Notify your caregiver of new moles, moles that have irregular borders, moles that are larger than a pencil eraser, or moles that have changed in shape or color.  Stay current with required immunizations.  Influenza. You need a dose every fall (or winter). The composition of the flu vaccine  changes each year, so being vaccinated once is not enough.  Pneumococcal polysaccharide. You need 1 to 2 doses if you smoke cigarettes or if you have certain chronic medical conditions. You need 1 dose at age 65 (or older) if you have never been vaccinated.  Tetanus, diphtheria, pertussis (Tdap, Td). Get 1 dose of Tdap vaccine if you are younger than age 65, are over 65 and have contact with an infant, are a healthcare worker, are pregnant, or simply want to be protected from whooping cough. After that, you need a Td   booster dose every 10 years. Consult your caregiver if you have not had at least 3 tetanus and diphtheria-containing shots sometime in your life or have a deep or dirty wound.  HPV. You need this vaccine if you are a woman age 26 or younger. The vaccine is given in 3 doses over 6 months.  Measles, mumps, rubella (MMR). You need at least 1 dose of MMR if you were born in 1957 or later. You may also need a second dose.  Meningococcal. If you are age 19 to 21 and a first-year college student living in a residence hall, or have one of several medical conditions, you need to get vaccinated against meningococcal disease. You may also need additional booster doses.  Zoster (shingles). If you are age 60 or older, you should get this vaccine.  Varicella (chickenpox). If you have never had chickenpox or you were vaccinated but received only 1 dose, talk to your caregiver to find out if you need this vaccine.  Hepatitis A. You need this vaccine if you have a specific risk factor for hepatitis A virus infection or you simply wish to be protected from this disease. The vaccine is usually given as 2 doses, 6 to 18 months apart.  Hepatitis B. You need this vaccine if you have a specific risk factor for hepatitis B virus infection or you simply wish to be protected from this disease. The vaccine is given in 3 doses, usually over 6 months. Preventive Services / Frequency Ages 19 to 39  Blood  pressure check.** / Every 1 to 2 years.  Lipid and cholesterol check.** / Every 5 years beginning at age 20.  Clinical breast exam.** / Every 3 years for women in their 20s and 30s.  Pap test.** / Every 2 years from ages 21 through 29. Every 3 years starting at age 30 through age 65 or 70 with a history of 3 consecutive normal Pap tests.  HPV screening.** / Every 3 years from ages 30 through ages 65 to 70 with a history of 3 consecutive normal Pap tests.  Hepatitis C blood test.** / For any individual with known risks for hepatitis C.  Skin self-exam. / Monthly.  Influenza immunization.** / Every year.  Pneumococcal polysaccharide immunization.** / 1 to 2 doses if you smoke cigarettes or if you have certain chronic medical conditions.  Tetanus, diphtheria, pertussis (Tdap, Td) immunization. / A one-time dose of Tdap vaccine. After that, you need a Td booster dose every 10 years.  HPV immunization. / 3 doses over 6 months, if you are 26 and younger.  Measles, mumps, rubella (MMR) immunization. / You need at least 1 dose of MMR if you were born in 1957 or later. You may also need a second dose.  Meningococcal immunization. / 1 dose if you are age 19 to 21 and a first-year college student living in a residence hall, or have one of several medical conditions, you need to get vaccinated against meningococcal disease. You may also need additional booster doses.  Varicella immunization.** / Consult your caregiver.  Hepatitis A immunization.** / Consult your caregiver. 2 doses, 6 to 18 months apart.  Hepatitis B immunization.** / Consult your caregiver. 3 doses usually over 6 months. Ages 40 to 64  Blood pressure check.** / Every 1 to 2 years.  Lipid and cholesterol check.** / Every 5 years beginning at age 20.  Clinical breast exam.** / Every year after age 40.  Mammogram.** / Every year beginning at age 40   and continuing for as long as you are in good health. Consult with your  caregiver.  Pap test.** / Every 3 years starting at age 30 through age 65 or 70 with a history of 3 consecutive normal Pap tests.  HPV screening.** / Every 3 years from ages 30 through ages 65 to 70 with a history of 3 consecutive normal Pap tests.  Fecal occult blood test (FOBT) of stool. / Every year beginning at age 50 and continuing until age 75. You may not need to do this test if you get a colonoscopy every 10 years.  Flexible sigmoidoscopy or colonoscopy.** / Every 5 years for a flexible sigmoidoscopy or every 10 years for a colonoscopy beginning at age 50 and continuing until age 75.  Hepatitis C blood test.** / For all people born from 1945 through 1965 and any individual with known risks for hepatitis C.  Skin self-exam. / Monthly.  Influenza immunization.** / Every year.  Pneumococcal polysaccharide immunization.** / 1 to 2 doses if you smoke cigarettes or if you have certain chronic medical conditions.  Tetanus, diphtheria, pertussis (Tdap, Td) immunization.** / A one-time dose of Tdap vaccine. After that, you need a Td booster dose every 10 years.  Measles, mumps, rubella (MMR) immunization. / You need at least 1 dose of MMR if you were born in 1957 or later. You may also need a second dose.  Varicella immunization.** / Consult your caregiver.  Meningococcal immunization.** / Consult your caregiver.  Hepatitis A immunization.** / Consult your caregiver. 2 doses, 6 to 18 months apart.  Hepatitis B immunization.** / Consult your caregiver. 3 doses, usually over 6 months. Ages 65 and over  Blood pressure check.** / Every 1 to 2 years.  Lipid and cholesterol check.** / Every 5 years beginning at age 20.  Clinical breast exam.** / Every year after age 40.  Mammogram.** / Every year beginning at age 40 and continuing for as long as you are in good health. Consult with your caregiver.  Pap test.** / Every 3 years starting at age 30 through age 65 or 70 with a 3  consecutive normal Pap tests. Testing can be stopped between 65 and 70 with 3 consecutive normal Pap tests and no abnormal Pap or HPV tests in the past 10 years.  HPV screening.** / Every 3 years from ages 30 through ages 65 or 70 with a history of 3 consecutive normal Pap tests. Testing can be stopped between 65 and 70 with 3 consecutive normal Pap tests and no abnormal Pap or HPV tests in the past 10 years.  Fecal occult blood test (FOBT) of stool. / Every year beginning at age 50 and continuing until age 75. You may not need to do this test if you get a colonoscopy every 10 years.  Flexible sigmoidoscopy or colonoscopy.** / Every 5 years for a flexible sigmoidoscopy or every 10 years for a colonoscopy beginning at age 50 and continuing until age 75.  Hepatitis C blood test.** / For all people born from 1945 through 1965 and any individual with known risks for hepatitis C.  Osteoporosis screening.** / A one-time screening for women ages 65 and over and women at risk for fractures or osteoporosis.  Skin self-exam. / Monthly.  Influenza immunization.** / Every year.  Pneumococcal polysaccharide immunization.** / 1 dose at age 65 (or older) if you have never been vaccinated.  Tetanus, diphtheria, pertussis (Tdap, Td) immunization. / A one-time dose of Tdap vaccine if you are over   65 and have contact with an infant, are a Research scientist (physical sciences), or simply want to be protected from whooping cough. After that, you need a Td booster dose every 10 years.  Varicella immunization.** / Consult your caregiver.  Meningococcal immunization.** / Consult your caregiver.  Hepatitis A immunization.** / Consult your caregiver. 2 doses, 6 to 18 months apart.  Hepatitis B immunization.** / Check with your caregiver. 3 doses, usually over 6 months. ** Family history and personal history of risk and conditions may change your caregiver's recommendations. Document Released: 07/31/2001 Document Revised: 08/27/2011  Document Reviewed: 10/30/2010 [facility] Patient Information 2013 [place], [place]. Calorie Counting Diet A calorie counting diet requires you to eat the number of calories that are right for you in a day. Calories are the measurement of how much energy you get from the food you eat. Eating the right amount of calories is important for staying at a healthy weight. If you eat too many calories, your body will store them as fat and you may gain weight. If you eat too few calories, you may lose weight. Counting the number of calories you eat during a day will help you know if you are eating the right amount. A Registered Dietitian can determine how many calories you need in a day. The amount of calories needed varies from person to person. If your goal is to lose weight, you will need to eat fewer calories. Losing weight can benefit you if you are overweight or have health problems such as heart disease, high blood pressure, or diabetes. If your goal is to gain weight, you will need to eat more calories. Gaining weight may be necessary if you have a certain health problem that causes your body to need more energy. TIPS Whether you are increasing or decreasing the number of calories you eat during a day, it may be hard to get used to changes in what you eat and drink. The following are tips to help you keep track of the number of calories you eat.  Measure foods at home with measuring cups. This helps you know the amount of food and number of calories you are eating.  Restaurants often serve food in amounts that are larger than 1 serving. While eating out, estimate how many servings of a food you are given. For example, a serving of cooked rice is  cup or about the size of half of a fist. Knowing serving sizes will help you be aware of how much food you are eating at restaurants.  Ask for smaller portion sizes or child-size portions at restaurants.  Plan to eat half of a meal at a restaurant. Take the rest  home or share the other half with a friend.  Read the Nutrition Facts panel on food labels for calorie content and serving size. You can find out how many servings are in a package, the size of a serving, and the number of calories each serving has.  For example, a package might contain 3 cookies. The Nutrition Facts panel on that package says that 1 serving is 1 cookie. Below that, it will say there are 3 servings in the container. The calories section of the Nutrition Facts label says there are 90 calories. This means there are 90 calories in 1 cookie (1 serving). If you eat 1 cookie you have eaten 90 calories. If you eat all 3 cookies, you have eaten 270 calories (3 servings x 90 calories = 270 calories). The list below tells you how  big or small some common portion sizes are.  1 oz.........4 stacked dice.  3 oz........Marland KitchenDeck of cards.  1 tsp.......Marland KitchenTip of little finger.  1 tbs......Marland KitchenMarland KitchenThumb.  2 tbs.......Marland KitchenGolf ball.   cup......Marland KitchenHalf of a fist.  1 cup.......Marland KitchenA fist. KEEP A FOOD LOG Write down every food item you eat, the amount you eat, and the number of calories in each food you eat during the day. At the end of the day, you can add up the total number of calories you have eaten. It may help to keep a list like the one below. Find out the calorie information by reading the Nutrition Facts panel on food labels. Breakfast  Bran cereal (1 cup, 110 calories).  Fat-free milk ( cup, 45 calories). Snack  Apple (1 medium, 80 calories). Lunch  Spinach (1 cup, 20 calories).  Tomato ( medium, 20 calories).  Chicken breast strips (3 oz, 165 calories).  Shredded cheddar cheese ( cup, 110 calories).  Light [place] dressing (2 tbs, 60 calories).  Whole-wheat bread (1 slice, 80 calories).  Tub margarine (1 tsp, 35 calories).  Vegetable soup (1 cup, 160 calories). Dinner  Pork chop (3 oz, 190 calories).  Brown rice (1 cup, 215 calories).  Steamed broccoli ( cup, 20  calories).  Strawberries (1  cup, 65 calories).  Whipped cream (1 tbs, 50 calories). Daily Calorie Total: 1425 Document Released: 06/04/2005 Document Revised: 08/27/2011 Document Reviewed: 11/29/2006 [facility] Patient Information 2013 [place], [place].

## 2012-09-01 ENCOUNTER — Telehealth: Payer: Self-pay

## 2012-09-01 DIAGNOSIS — N390 Urinary tract infection, site not specified: Secondary | ICD-10-CM

## 2012-09-01 MED ORDER — NITROFURANTOIN MONOHYD MACRO 100 MG PO CAPS: 100.0000 mg | ORAL_CAPSULE | Freq: Two times a day (BID) | ORAL | Status: DC

## 2012-09-01 MED ORDER — FLUCONAZOLE 150 MG PO TABS: 150.0000 mg | ORAL_TABLET | Freq: Every day | ORAL | Status: DC

## 2012-09-01 NOTE — Addendum Note (Signed)
Addended by: Reva Bores on: 09/01/2012 03:29 PM   Modules accepted: Orders

## 2012-09-01 NOTE — Telephone Encounter (Signed)
Patient called with symtoms of a UTI she needed something called in we called her in some Macrobid 100 mg tab and also [address]an [address] Please document on her med list, thanks.

## 2013-05-06 ENCOUNTER — Telehealth: Payer: Self-pay | Admitting: *Deleted

## 2013-05-06 DIAGNOSIS — N390 Urinary tract infection, site not specified: Secondary | ICD-10-CM

## 2013-05-06 DIAGNOSIS — B379 Candidiasis, unspecified: Secondary | ICD-10-CM

## 2013-05-06 MED ORDER — FLUCONAZOLE 150 MG PO TABS: 150.0000 mg | ORAL_TABLET | Freq: Every day | ORAL | Status: DC

## 2013-05-06 NOTE — Telephone Encounter (Signed)
Patient called to schedule her appointment for physical.  It is in feb so she will call back closer to time to make appointment.  She also is having a yeast infection and would like diflucan called in for her.

## 2013-09-22 ENCOUNTER — Ambulatory Visit (INDEPENDENT_AMBULATORY_CARE_PROVIDER_SITE_OTHER): Payer: BC Managed Care – PPO | Admitting: Family Medicine

## 2013-09-22 ENCOUNTER — Encounter: Payer: Self-pay | Admitting: Family Medicine

## 2013-09-22 VITALS — BP 121/81 | HR 74 | Ht 65.0 in | Wt 168.0 lb

## 2013-09-22 DIAGNOSIS — Z01419 Encounter for gynecological examination (general) (routine) without abnormal findings: Secondary | ICD-10-CM

## 2013-09-22 DIAGNOSIS — E663 Overweight: Secondary | ICD-10-CM

## 2013-09-22 MED ORDER — PHENTERMINE HCL 37.5 MG PO CAPS: 37.5000 mg | ORAL_CAPSULE | ORAL | Status: DC

## 2013-09-22 NOTE — Progress Notes (Signed)
  Subjective:     Tanya Meza is a 33 y.o. female and is here for a comprehensive physical exam. The patient reports no problems. Is going back to school for dental hygiene.   History   Social History  . Marital Status: Married    Spouse Name: N/A    Number of Children: N/A  . Years of Education: N/A   Occupational History  . Not on file.   Social History Main Topics  . Smoking status: Never Smoker   . Smokeless tobacco: Never Used  . Alcohol Use: Yes     Comment: social  . Drug Use: No  . Sexual Activity: Yes    Partners: Male    Birth Control/ Protection: Surgical     Comment: tubaligation.   Other Topics Concern  . Not on file   Social History Narrative  . No narrative on file   Health Maintenance  Topic Date Due  . Influenza Vaccine  01/16/2014  . Pap Smear  08/06/2015  . Tetanus/tdap  05/15/2021    The following portions of the patient's history were reviewed and updated as appropriate: allergies, current medications, past family history, past medical history, past social history, past surgical history and problem list.  Review of Systems A comprehensive review of systems was negative.   Objective:    BP 121/81  Pulse 74  Ht 5\' 5"  (1.651 m)  Wt 168 lb (76.204 kg)  BMI 27.96 kg/m2  LMP 07/25/2013 General appearance: alert, cooperative and appears stated age Head: Normocephalic, without obvious abnormality, atraumatic Neck: no adenopathy, supple, symmetrical, trachea midline and thyroid not enlarged, symmetric, no tenderness/mass/nodules Lungs: clear to auscultation bilaterally Breasts: normal appearance, no masses or tenderness Heart: regular rate and rhythm, S1, S2 normal, no murmur, click, rub or gallop Abdomen: soft, non-tender; bowel sounds normal; no masses,  no organomegaly Pelvic: cervix normal in appearance, external genitalia normal, no adnexal masses or tenderness, no cervical motion tenderness, uterus normal size, shape, and consistency  and vagina normal without discharge Extremities: extremities normal, atraumatic, no cyanosis or edema Pulses: 2+ and symmetric Skin: Skin color, texture, turgor normal. No rashes or lesions Lymph nodes: Cervical, supraclavicular, and axillary nodes normal. Neurologic: Grossly normal    Assessment:    Healthy female exam.      Plan:    Doing well Normal pap last year. See After Visit Summary for Counseling Recommendations

## 2013-09-22 NOTE — Patient Instructions (Signed)

## 2013-09-28 ENCOUNTER — Other Ambulatory Visit (INDEPENDENT_AMBULATORY_CARE_PROVIDER_SITE_OTHER): Payer: BC Managed Care – PPO | Admitting: *Deleted

## 2013-09-28 DIAGNOSIS — Z1329 Encounter for screening for other suspected endocrine disorder: Secondary | ICD-10-CM

## 2013-09-28 DIAGNOSIS — Z13220 Encounter for screening for lipoid disorders: Secondary | ICD-10-CM

## 2013-09-28 DIAGNOSIS — Z130 Encounter for screening for diseases of the blood and blood-forming organs and certain disorders involving the immune mechanism: Secondary | ICD-10-CM

## 2013-09-28 DIAGNOSIS — Z13228 Encounter for screening for other metabolic disorders: Secondary | ICD-10-CM

## 2013-09-28 LAB — CBC WITH DIFFERENTIAL/PLATELET
BASOS PCT: 1 % (ref 0–1)
Basophils Absolute: 0 10*3/uL (ref 0.0–0.1)
EOS ABS: 0.1 10*3/uL (ref 0.0–0.7)
EOS PCT: 3 % (ref 0–5)
HCT: 39.1 % (ref 36.0–46.0)
HEMOGLOBIN: 12.9 g/dL (ref 12.0–15.0)
Lymphocytes Relative: 37 % (ref 12–46)
Lymphs Abs: 1.6 10*3/uL (ref 0.7–4.0)
MCH: 28.5 pg (ref 26.0–34.0)
MCHC: 33 g/dL (ref 30.0–36.0)
MCV: 86.3 fL (ref 78.0–100.0)
MONO ABS: 0.5 10*3/uL (ref 0.1–1.0)
MONOS PCT: 11 % (ref 3–12)
NEUTROS PCT: 48 % (ref 43–77)
Neutro Abs: 2 10*3/uL (ref 1.7–7.7)
Platelets: 273 10*3/uL (ref 150–400)
RBC: 4.53 MIL/uL (ref 3.87–5.11)
RDW: 14.4 % (ref 11.5–15.5)
WBC: 4.2 10*3/uL (ref 4.0–10.5)

## 2013-09-29 LAB — COMPREHENSIVE METABOLIC PANEL
ALT: 12 U/L (ref 0–35)
AST: 14 U/L (ref 0–37)
Albumin: 4.4 g/dL (ref 3.5–5.2)
Alkaline Phosphatase: 54 U/L (ref 39–117)
BILIRUBIN TOTAL: 0.4 mg/dL (ref 0.2–1.2)
BUN: 18 mg/dL (ref 6–23)
CO2: 28 mEq/L (ref 19–32)
CREATININE: 0.84 mg/dL (ref 0.50–1.10)
Calcium: 9.3 mg/dL (ref 8.4–10.5)
Chloride: 102 mEq/L (ref 96–112)
Glucose, Bld: 76 mg/dL (ref 70–99)
Potassium: 4.2 mEq/L (ref 3.5–5.3)
Sodium: 138 mEq/L (ref 135–145)
Total Protein: 7 g/dL (ref 6.0–8.3)

## 2013-09-29 LAB — LIPID PANEL
CHOLESTEROL: 186 mg/dL (ref 0–200)
HDL: 55 mg/dL (ref >39)
LDL Cholesterol: 118 mg/dL — ABNORMAL HIGH (ref 0–99)
TRIGLYCERIDES: 65 mg/dL (ref <150)
Total CHOL/HDL Ratio: 3.4 Ratio
VLDL: 13 mg/dL (ref 0–40)

## 2013-09-29 LAB — TSH: TSH: 3.669 u[IU]/mL (ref 0.350–4.500)

## 2013-11-11 ENCOUNTER — Other Ambulatory Visit: Payer: Self-pay | Admitting: *Deleted

## 2013-11-11 NOTE — Telephone Encounter (Signed)
I called in a refill for patient for her Phentermine 37.5 mg.  #30 with no refills.  Called into [facility], [address] [place].

## 2014-04-19 ENCOUNTER — Encounter: Payer: Self-pay | Admitting: Family Medicine

## 2014-09-27 ENCOUNTER — Telehealth: Payer: Self-pay | Admitting: *Deleted

## 2014-09-27 NOTE — Telephone Encounter (Signed)
Attempted to call meds to pharmacy and got a busy signal four times.  I will attempt to call again tomorrow.

## 2014-09-27 NOTE — Telephone Encounter (Signed)
-----   Message from Reva Boresanya S Pratt, MD sent at 09/24/2014  2:20 PM EDT ----- Regarding: FW: RX REFILL  Contact: [phone] Please call in 1 more refill only ----- Message -----    From: Harlene SaltsSusan N Ross    Sent: 09/24/2014  11:49 AM      To: Reva Boresanya S Pratt, MD Subject: RX REFILL                                      Hi Dr. Shawnie PonsPratt,  This patient called wanted to know if you could call her in one more refill on her Phentermine 37.5mg  [facility]. Thanks! It has really helped her.

## 2014-10-07 ENCOUNTER — Telehealth: Payer: Self-pay | Admitting: *Deleted

## 2014-10-07 NOTE — Telephone Encounter (Signed)
Pharmacy did not receive RX for phentermine.  I called and authorized the prescription for one refill only per Dr. Shawnie PonsPratt.

## 2014-11-22 ENCOUNTER — Telehealth: Payer: Self-pay | Admitting: *Deleted

## 2014-11-22 DIAGNOSIS — B373 Candidiasis of vulva and vagina: Secondary | ICD-10-CM

## 2014-11-22 DIAGNOSIS — B3731 Acute candidiasis of vulva and vagina: Secondary | ICD-10-CM

## 2014-11-22 MED ORDER — FLUCONAZOLE 150 MG PO TABS: 150.0000 mg | ORAL_TABLET | Freq: Once | ORAL | Status: DC

## 2014-11-22 NOTE — Telephone Encounter (Signed)
Patient has a yeast infection and would like medication called into the pharmacy for her.

## 2014-12-30 DIAGNOSIS — F430 Acute stress reaction: Secondary | ICD-10-CM | POA: Insufficient documentation

## 2014-12-30 DIAGNOSIS — N200 Calculus of kidney: Secondary | ICD-10-CM | POA: Insufficient documentation

## 2014-12-30 DIAGNOSIS — R000 Tachycardia, unspecified: Secondary | ICD-10-CM | POA: Insufficient documentation

## 2015-01-03 ENCOUNTER — Encounter: Payer: Self-pay | Admitting: Physician Assistant

## 2015-01-03 ENCOUNTER — Ambulatory Visit (INDEPENDENT_AMBULATORY_CARE_PROVIDER_SITE_OTHER): Payer: Self-pay | Admitting: Physician Assistant

## 2015-01-03 VITALS — BP 92/70 | HR 70 | Temp 98.0°F | Resp 16 | Ht 63.5 in | Wt 166.0 lb

## 2015-01-03 DIAGNOSIS — Z0184 Encounter for antibody response examination: Secondary | ICD-10-CM

## 2015-01-03 DIAGNOSIS — Z0000 Encounter for general adult medical examination without abnormal findings: Secondary | ICD-10-CM

## 2015-01-03 NOTE — Patient Instructions (Signed)
Health Maintenance Adopting a healthy lifestyle and getting preventive care can go a long way to promote health and wellness. Talk with your health care provider about what schedule of regular examinations is right for you. This is a good chance for you to check in with your provider about disease prevention and staying healthy. In between checkups, there are plenty of things you can do on your own. Experts have done a lot of research about which lifestyle changes and preventive measures are most likely to keep you healthy. Ask your health care provider for more information. WEIGHT AND DIET  Eat a healthy diet 1. Be sure to include plenty of vegetables, fruits, low-fat dairy products, and lean protein. 2. Do not eat a lot of foods high in solid fats, added sugars, or salt. 3. Get regular exercise. This is one of the most important things you can do for your health. 1. Most adults should exercise for at least 150 minutes each week. The exercise should increase your heart rate and make you sweat (moderate-intensity exercise). 2. Most adults should also do strengthening exercises at least twice a week. This is in addition to the moderate-intensity exercise.  Maintain a healthy weight 1. Body mass index (BMI) is a measurement that can be used to identify possible weight problems. It estimates body fat based on height and weight. Your health care provider can help determine your BMI and help you achieve or maintain a healthy weight. 2. For females 20 years of age and older:  1. A BMI below 18.5 is considered underweight. 2. A BMI of 18.5 to 24.9 is normal. 3. A BMI of 25 to 29.9 is considered overweight. 4. A BMI of 30 and above is considered obese.  Watch levels of cholesterol and blood lipids 1. You should start having your blood tested for lipids and cholesterol at 36 years of age, then have this test every 5 years. 2. You may need to have your cholesterol levels checked more often if: 1. Your  lipid or cholesterol levels are high. 2. You are older than 36 years of age. 3. You are at high risk for heart disease.  CANCER SCREENING   Lung Cancer 1. Lung cancer screening is recommended for adults 55-80 years old who are at high risk for lung cancer because of a history of smoking. 2. A yearly low-dose CT scan of the lungs is recommended for people who: 1. Currently smoke. 2. Have quit within the past 15 years. 3. Have at least a 30-pack-year history of smoking. A pack year is smoking an average of one pack of cigarettes a day for 1 year. 3. Yearly screening should continue until it has been 15 years since you quit. 4. Yearly screening should stop if you develop a health problem that would prevent you from having lung cancer treatment.  Breast Cancer  Practice breast self-awareness. This means understanding how your breasts normally appear and feel.  It also means doing regular breast self-exams. Let your health care provider know about any changes, no matter how small.  If you are in your 20s or 30s, you should have a clinical breast exam (CBE) by a health care provider every 1-3 years as part of a regular health exam.  If you are 40 or older, have a CBE every year. Also consider having a breast X-ray (mammogram) every year.  If you have a family history of breast cancer, talk to your health care provider about genetic screening.  If you are   at high risk for breast cancer, talk to your health care provider about having an MRI and a mammogram every year.  Breast cancer gene (BRCA) assessment is recommended for women who have family members with BRCA-related cancers. BRCA-related cancers include:  Breast.  Ovarian.  Tubal.  Peritoneal cancers.  Results of the assessment will determine the need for genetic counseling and BRCA1 and BRCA2 testing. Cervical Cancer Routine pelvic examinations to screen for cervical cancer are no longer recommended for nonpregnant women who  are considered low risk for cancer of the pelvic organs (ovaries, uterus, and vagina) and who do not have symptoms. A pelvic examination may be necessary if you have symptoms including those associated with pelvic infections. Ask your health care provider if a screening pelvic exam is right for you.   The Pap test is the screening test for cervical cancer for women who are considered at risk.  If you had a hysterectomy for a problem that was not cancer or a condition that could lead to cancer, then you no longer need Pap tests.  If you are older than 65 years, and you have had normal Pap tests for the past 10 years, you no longer need to have Pap tests.  If you have had past treatment for cervical cancer or a condition that could lead to cancer, you need Pap tests and screening for cancer for at least 20 years after your treatment.  If you no longer get a Pap test, assess your risk factors if they change (such as having a new sexual partner). This can affect whether you should start being screened again.  Some women have medical problems that increase their chance of getting cervical cancer. If this is the case for you, your health care provider may recommend more frequent screening and Pap tests.  The human papillomavirus (HPV) test is another test that may be used for cervical cancer screening. The HPV test looks for the virus that can cause cell changes in the cervix. The cells collected during the Pap test can be tested for HPV.  The HPV test can be used to screen women 2 years of age and older. Getting tested for HPV can extend the interval between normal Pap tests from three to five years.  An HPV test also should be used to screen women of any age who have unclear Pap test results.  After 36 years of age, women should have HPV testing as often as Pap tests.  Colorectal Cancer  This type of cancer can be detected and often prevented.  Routine colorectal cancer screening usually  begins at 36 years of age and continues through 36 years of age.  Your health care provider may recommend screening at an earlier age if you have risk factors for colon cancer.  Your health care provider may also recommend using home test kits to check for hidden blood in the stool.  A small camera at the end of a tube can be used to examine your colon directly (sigmoidoscopy or colonoscopy). This is done to check for the earliest forms of colorectal cancer.  Routine screening usually begins at age 57.  Direct examination of the colon should be repeated every 5-10 years through 36 years of age. However, you may need to be screened more often if early forms of precancerous polyps or small growths are found. Skin Cancer  Check your skin from head to toe regularly.  Tell your health care provider about any new moles or changes in  moles, especially if there is a change in a mole's shape or color.  Also tell your health care provider if you have a mole that is larger than the size of a pencil eraser.  Always use sunscreen. Apply sunscreen liberally and repeatedly throughout the day.  Protect yourself by wearing long sleeves, pants, a wide-brimmed hat, and sunglasses whenever you are outside. HEART DISEASE, DIABETES, AND HIGH BLOOD PRESSURE   Have your blood pressure checked at least every 1-2 years. High blood pressure causes heart disease and increases the risk of stroke.  If you are between 32 years and 30 years old, ask your health care provider if you should take aspirin to prevent strokes.  Have regular diabetes screenings. This involves taking a blood sample to check your fasting blood sugar level.  If you are at a normal weight and have a low risk for diabetes, have this test once every three years after 36 years of age.  If you are overweight and have a high risk for diabetes, consider being tested at a younger age or more often. PREVENTING INFECTION  Hepatitis B  If you have a  higher risk for hepatitis B, you should be screened for this virus. You are considered at high risk for hepatitis B if:  You were born in a country where hepatitis B is common. Ask your health care provider which countries are considered high risk.  Your parents were born in a high-risk country, and you have not been immunized against hepatitis B (hepatitis B vaccine).  You have HIV or AIDS.  You use needles to inject street drugs.  You live with someone who has hepatitis B.  You have had sex with someone who has hepatitis B.  You get hemodialysis treatment.  You take certain medicines for conditions, including cancer, organ transplantation, and autoimmune conditions. Hepatitis C  Blood testing is recommended for:  Everyone born from [birth year] through 1967.  Anyone with known risk factors for hepatitis C. Sexually transmitted infections (STIs)  You should be screened for sexually transmitted infections (STIs) including gonorrhea and chlamydia if:  You are sexually active and are younger than 36 years of age.  You are older than 36 years of age and your health care provider tells you that you are at risk for this type of infection.  Your sexual activity has changed since you were last screened and you are at an increased risk for chlamydia or gonorrhea. Ask your health care provider if you are at risk.  If you do not have HIV, but are at risk, it may be recommended that you take a prescription medicine daily to prevent HIV infection. This is called pre-exposure prophylaxis (PrEP). You are considered at risk if:  You are sexually active and do not regularly use condoms or know the HIV status of your partner(s).  You take drugs by injection.  You are sexually active with a partner who has HIV. Talk with your health care provider about whether you are at high risk of being infected with HIV. If you choose to begin PrEP, you should first be tested for HIV. You should then be tested  every 3 months for as long as you are taking PrEP.  PREGNANCY   If you are premenopausal and you may become pregnant, ask your health care provider about preconception counseling.  If you may become pregnant, take 400 to 800 micrograms (mcg) of folic acid every day.  If you want to prevent pregnancy, talk to your  health care provider about birth control (contraception). OSTEOPOROSIS AND MENOPAUSE   Osteoporosis is a disease in which the bones lose minerals and strength with aging. This can result in serious bone fractures. Your risk for osteoporosis can be identified using a bone density scan.  If you are 34 years of age or older, or if you are at risk for osteoporosis and fractures, ask your health care provider if you should be screened.  Ask your health care provider whether you should take a calcium or vitamin D supplement to lower your risk for osteoporosis.  Menopause may have certain physical symptoms and risks.  Hormone replacement therapy may reduce some of these symptoms and risks. Talk to your health care provider about whether hormone replacement therapy is right for you.  HOME CARE INSTRUCTIONS   Schedule regular health, dental, and eye exams.  Stay current with your immunizations.   Do not use any tobacco products including cigarettes, chewing tobacco, or electronic cigarettes.  If you are pregnant, do not drink alcohol.  If you are breastfeeding, limit how much and how often you drink alcohol.  Limit alcohol intake to no more than 1 drink per day for nonpregnant women. One drink equals 12 ounces of beer, 5 ounces of wine, or 1 ounces of hard liquor.  Do not use street drugs.  Do not share needles.  Ask your health care provider for help if you need support or information about quitting drugs.  Tell your health care provider if you often feel depressed.  Tell your health care provider if you have ever been abused or do not feel safe at home. Document  Released: 12/18/2010 Document Revised: 10/19/2013 Document Reviewed: 05/06/2013 [facility] [facility] Patient Information 2015 [place], [place]. This information is not intended to replace advice given to you by your health care provider. Make sure you discuss any questions you have with your health care provider.     Why follow it? Research shows. . Those who follow the Mediterranean diet have a reduced risk of heart disease  . The diet is associated with a reduced incidence of Parkinson's and Alzheimer's diseases . People following the diet may have longer life expectancies and lower rates of chronic diseases  . The Dietary Guidelines for Americans recommends the Mediterranean diet as an eating plan to promote health and prevent disease  What Is the Mediterranean Diet?  . Healthy eating plan based on typical foods and recipes of Mediterranean-style cooking . The diet is primarily a plant based diet; these foods should make up a majority of meals   Starches - Plant based foods should make up a majority of meals - They are an important sources of vitamins, minerals, energy, antioxidants, and fiber - Choose whole grains, foods high in fiber and minimally processed items  - Typical grain sources include wheat, oats, barley, corn, brown rice, bulgar, farro, millet, polenta, couscous  - Various types of beans include chickpeas, lentils, fava beans, black beans, white beans   Fruits  Veggies - Large quantities of antioxidant rich fruits & veggies; 6 or more servings  - Vegetables can be eaten raw or lightly drizzled with oil and cooked  - Vegetables common to the traditional Mediterranean Diet include: artichokes, arugula, beets, broccoli, brussel sprouts, cabbage, carrots, celery, collard greens, cucumbers, eggplant, kale, leeks, lemons, lettuce, mushrooms, okra, onions, peas, peppers, potatoes, pumpkin, radishes, rutabaga, shallots, spinach, sweet potatoes, turnips, zucchini - Fruits common to the Mediterranean  Diet include: apples, apricots, avocados, cherries, clementines, dates, figs, grapefruits,  grapes, melons, nectarines, oranges, peaches, pears, pomegranates, strawberries, tangerines  Fats - Replace butter and margarine with healthy oils, such as olive oil, canola oil, and tahini  - Limit nuts to no more than a handful a day  - Nuts include walnuts, almonds, pecans, pistachios, pine nuts  - Limit or avoid candied, honey roasted or heavily salted nuts - Olives are central to the Mediterranean diet - can be eaten whole or used in a variety of dishes   Meats Protein - Limiting red meat: no more than a few times a month - When eating red meat: choose lean cuts and keep the portion to the size of deck of cards - Eggs: approx. 0 to 4 times a week  - Fish and lean poultry: at least 2 a week  - Healthy protein sources include, chicken, [place], lean beef, lamb - Increase intake of seafood such as tuna, salmon, trout, mackerel, shrimp, scallops - Avoid or limit high fat processed meats such as sausage and bacon  Dairy - Include moderate amounts of low fat dairy products  - Focus on healthy dairy such as fat free yogurt, skim milk, low or reduced fat cheese - Limit dairy products higher in fat such as whole or 2% milk, cheese, ice cream  Alcohol - Moderate amounts of red wine is ok  - No more than 5 oz daily for women (all ages) and men older than age 74  - No more than 10 oz of wine daily for men younger than 44  Other - Limit sweets and other desserts  - Use herbs and spices instead of salt to flavor foods  - Herbs and spices common to the traditional Mediterranean Diet include: basil, bay leaves, chives, cloves, cumin, fennel, garlic, lavender, marjoram, mint, oregano, parsley, pepper, rosemary, sage, savory, sumac, tarragon, thyme   It's not just a diet, it's a lifestyle:  . The Mediterranean diet includes lifestyle factors typical of those in the region  . Foods, drinks and meals are best eaten  with others and savored . Daily physical activity is important for overall good health . This could be strenuous exercise like running and aerobics . This could also be more leisurely activities such as walking, housework, yard-work, or taking the stairs . Moderation is the key; a balanced and healthy diet accommodates most foods and drinks . Consider portion sizes and frequency of consumption of certain foods   Meal Ideas & Options:  . Breakfast:  o Whole wheat toast or whole wheat English muffins with peanut butter & hard boiled egg o Steel cut oats topped with apples & cinnamon and skim milk  o Fresh fruit: banana, strawberries, melon, berries, peaches  o Smoothies: strawberries, bananas, greek yogurt, peanut butter o Low fat greek yogurt with blueberries and granola  o Egg white omelet with spinach and mushrooms o Breakfast couscous: whole wheat couscous, apricots, skim milk, cranberries  . Sandwiches:  o Hummus and grilled vegetables (peppers, zucchini, squash) on whole wheat bread   o Grilled chicken on whole wheat pita with lettuce, tomatoes, cucumbers or tzatziki  o Tuna salad on whole wheat bread: tuna salad made with greek yogurt, olives, red peppers, capers, green onions o Garlic rosemary lamb pita: lamb sauted with garlic, rosemary, salt & pepper; add lettuce, cucumber, greek yogurt to pita - flavor with lemon juice and black pepper  . Seafood:  o Mediterranean grilled salmon, seasoned with garlic, basil, parsley, lemon juice and black pepper o Shrimp, lemon, and spinach  whole-grain pasta salad made with low fat greek yogurt  o Seared scallops with lemon orzo  o Seared tuna steaks seasoned salt, pepper, coriander topped with tomato mixture of olives, tomatoes, olive oil, minced garlic, parsley, green onions and cappers  . Meats:  o Herbed greek chicken salad with kalamata olives, cucumber, feta  o Red bell peppers stuffed with spinach, bulgur, lean ground beef (or lentils) &  topped with feta   o Kebabs: skewers of chicken, tomatoes, onions, zucchini, squash  o [place] burgers: made with red onions, mint, dill, lemon juice, feta cheese topped with roasted red peppers . Vegetarian o Cucumber salad: cucumbers, artichoke hearts, celery, red onion, feta cheese, tossed in olive oil & lemon juice  o Hummus and whole grain pita points with a greek salad (lettuce, tomato, feta, olives, cucumbers, red onion) o Lentil soup with celery, carrots made with vegetable broth, garlic, salt and pepper  o Tabouli salad: parsley, bulgur, mint, scallions, cucumbers, tomato, radishes, lemon juice, olive oil, salt and pepper.      American Heart Association (AHA) Exercise Recommendation  Being physically active is important to prevent heart disease and stroke, the nation's No. 1and No. 5killers. To improve overall cardiovascular health, we suggest at least 150 minutes per week of moderate exercise or 75 minutes per week of vigorous exercise (or a combination of moderate and vigorous activity). Thirty minutes a day, five times a week is an easy goal to remember. You will also experience benefits even if you divide your time into two or three segments of 10 to 15 minutes per day.  For people who would benefit from lowering their blood pressure or cholesterol, we recommend 40 minutes of aerobic exercise of moderate to vigorous intensity three to four times a week to lower the risk for heart attack and stroke.  Physical activity is anything that makes you move your body and burn calories.  This includes things like climbing stairs or playing sports. Aerobic exercises benefit your heart, and include walking, jogging, swimming or biking. Strength and stretching exercises are best for overall stamina and flexibility.  The simplest, positive change you can make to effectively improve your heart health is to start walking. It's enjoyable, free, easy, social and great exercise. A walking program is  flexible and boasts high success rates because people can stick with it. It's easy for walking to become a regular and satisfying part of life.   For Overall Cardiovascular Health:  At least 30 minutes of moderate-intensity aerobic activity at least 5 days per week for a total of 150  OR   At least 25 minutes of vigorous aerobic activity at least 3 days per week for a total of 75 minutes; or a combination of moderate- and vigorous-intensity aerobic activity  AND   Moderate- to high-intensity muscle-strengthening activity at least 2 days per week for additional health benefits.  For Lowering Blood Pressure and Cholesterol  An average 40 minutes of moderate- to vigorous-intensity aerobic activity 3 or 4 times per week  What if I can't make it to the time goal? Something is always better than nothing! And everyone has to start somewhere. Even if you've been sedentary for years, today is the day you can begin to make healthy changes in your life. If you don't think you'll make it for 30 or 40 minutes, set a reachable goal for today. You can work up toward your overall goal by increasing your time as you get stronger. Don't let all-or-nothing thinking  rob you of doing what you can every day.  Source:http://www.heart.org

## 2015-01-03 NOTE — Progress Notes (Signed)
Patient ID: ERRIKA NARVAIZ, female   DOB: 1981-04-04, 34 y.o.   MRN: [id number]       Patient: Tanya Meza, Female    DOB: 1981/06/12, 34 y.o.   MRN: [id number] Visit Date: 01/03/2015  Today's Provider: Mar Daring, PA-C   Chief Complaint  Patient presents with  . Annual Exam    needs paper work to be filled for school.   Subjective:    Annual physical exam Tanya Meza is a 34 y.o. female who presents today for health maintenance and complete physical. She feels well. She reports exercising none. She reports she is sleeping well.  She is followed by an OB/GYN and reports normal pap smears and breast exams.  No family history of breast cancer, ovarian cancer, or colon cancer.  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  -----------------------------------------------------------------   Review of Systems  Constitutional: Negative.   HENT: Negative.   Eyes: Negative.   Respiratory: Negative.   Cardiovascular: Negative.   Gastrointestinal: Negative.   Endocrine: Negative.   Genitourinary: Negative.   Musculoskeletal: Negative.   Skin: Negative.   Allergic/Immunologic: Negative.   Neurological: Negative.   Hematological: Negative.   Psychiatric/Behavioral: Negative.     Social History She  reports that she has never smoked. She has never used smokeless tobacco. She reports that she drinks alcohol. She reports that she does not use illicit drugs.  Patient Active Problem List   Diagnosis Date Noted  . Calculus of kidney 12/30/2014  . Fast heart beat 12/30/2014  . Acute stress disorder 12/30/2014  . Overweight(278.02) 08/05/2012    Past Surgical History  Procedure Laterality Date  . Wisdom tooth extraction    . Cesarean section  2005, 2008    x3    Family History Her family history includes Alcohol abuse in her paternal grandfather; Diabetes in her father; Heart attack in her maternal grandfather and paternal  grandfather; Hyperlipidemia in her brother, father, and mother; Hypertension in her brother; Hypothyroidism in her mother; Thyroid disease in her mother. There is no history of Anesthesia problems.    Allergies  Allergen Reactions  . Latex Swelling    Swelling- around catheter    Previous Medications   FLUCONAZOLE (DIFLUCAN) 150 MG TABLET    Take 1 tablet (150 mg total) by mouth once.   IBUPROFEN (ADVIL,MOTRIN) 600 MG TABLET    Take 1 tablet (600 mg total) by mouth every 6 (six) hours as needed for pain.   PHENTERMINE 37.5 MG CAPSULE    Take 1 capsule (37.5 mg total) by mouth every morning.   RANITIDINE (ZANTAC) 150 MG TABLET    Take 150 mg by mouth as needed for heartburn. Over the counter    Patient Care Team: Mar Daring, PA-C as PCP - General (Physician Assistant)     Objective:   Vitals: BP 92/70 mmHg  Pulse 70  Temp(Src) 98 F (36.7 C) (Oral)  Resp 16  Ht 5' 3.5" (1.613 m)  Wt 166 lb (75.297 kg)  BMI 28.94 kg/m2  LMP 12/22/2014   Physical Exam  Constitutional: She is oriented to person, place, and time. She appears well-developed and well-nourished. No distress.  HENT:  Head: Normocephalic and atraumatic.  Right Ear: External ear normal.  Left Ear: External ear normal.  Nose: Nose normal.  Mouth/Throat: Oropharynx is clear and moist. No oropharyngeal exudate.  Eyes: Conjunctivae and EOM are normal. Pupils are equal, round, and reactive  to light. Right eye exhibits no discharge. Left eye exhibits no discharge. No scleral icterus.  Neck: Normal range of motion. Neck supple. No JVD present. No tracheal deviation present. No thyromegaly present.  Cardiovascular: Normal rate, regular rhythm, normal heart sounds and intact distal pulses.  Exam reveals no gallop and no friction rub.   No murmur heard. Pulmonary/Chest: Effort normal and breath sounds normal. No respiratory distress. She has no wheezes. She has no rales. She exhibits no tenderness.  Abdominal: Soft.  Bowel sounds are normal. She exhibits no distension and no mass. There is no tenderness. There is no rebound and no guarding.  Genitourinary:  Deferred breast and pelvic exam to gyn.  Musculoskeletal: Normal range of motion. She exhibits no edema or tenderness.  Lymphadenopathy:    She has no cervical adenopathy.  Neurological: She is alert and oriented to person, place, and time.  Skin: Skin is warm and dry. No rash noted. She is not diaphoretic.  Psychiatric: She has a normal mood and affect. Her behavior is normal. Judgment and thought content normal.  Vitals reviewed.    Depression Screen No flowsheet data found.    Assessment & Plan:     Routine Health Maintenance and Physical Exam  Exercise Activities and Dietary recommendations Goals    None      Immunization History  Administered Date(s) Administered  . Influenza Split 04/24/2011, 08/05/2012  . PPD Test 07/23/2011  . Tdap 05/16/2011    Health Maintenance  Topic Date Due  . INFLUENZA VACCINE  01/17/2015  . PAP SMEAR  08/06/2015  . TETANUS/TDAP  05/15/2021  . HIV Screening  Completed      Discussed health benefits of physical activity, and encouraged her to engage in regular exercise appropriate for her age and condition.  1. Annual physical exam Form filled out for school.  Up to date with vaccines.  No record of MMR on file.  She is to call her HS to see if they have this record.  Will get titers for varicella and Hep B.  We also discussed weight loss with adding some exercise to her daily routine and to start a food diary.   2. Immunity status testing - Hepatitis B surface antibody - Varicella Zoster Abs, IgG/IgM     --------------------------------------------------------------------

## 2015-01-13 ENCOUNTER — Encounter: Payer: Self-pay | Admitting: Physician Assistant

## 2015-01-13 ENCOUNTER — Ambulatory Visit (INDEPENDENT_AMBULATORY_CARE_PROVIDER_SITE_OTHER): Payer: Self-pay | Admitting: Physician Assistant

## 2015-01-13 VITALS — BP 110/82 | HR 88 | Temp 100.1°F | Resp 16 | Wt 165.2 lb

## 2015-01-13 DIAGNOSIS — T3695XA Adverse effect of unspecified systemic antibiotic, initial encounter: Secondary | ICD-10-CM

## 2015-01-13 DIAGNOSIS — R531 Weakness: Secondary | ICD-10-CM

## 2015-01-13 DIAGNOSIS — R51 Headache: Secondary | ICD-10-CM

## 2015-01-13 DIAGNOSIS — B379 Candidiasis, unspecified: Secondary | ICD-10-CM

## 2015-01-13 DIAGNOSIS — R05 Cough: Secondary | ICD-10-CM

## 2015-01-13 DIAGNOSIS — R52 Pain, unspecified: Secondary | ICD-10-CM

## 2015-01-13 DIAGNOSIS — R519 Headache, unspecified: Secondary | ICD-10-CM

## 2015-01-13 DIAGNOSIS — R509 Fever, unspecified: Secondary | ICD-10-CM

## 2015-01-13 DIAGNOSIS — R059 Cough, unspecified: Secondary | ICD-10-CM

## 2015-01-13 LAB — POCT INFLUENZA A/B
INFLUENZA A, POC: NEGATIVE
Influenza B, POC: NEGATIVE

## 2015-01-13 MED ORDER — FLUCONAZOLE 150 MG PO TABS: 150.0000 mg | ORAL_TABLET | Freq: Once | ORAL | Status: DC

## 2015-01-13 MED ORDER — LEVOFLOXACIN 750 MG PO TABS: 750.0000 mg | ORAL_TABLET | Freq: Every day | ORAL | Status: DC

## 2015-01-14 NOTE — Progress Notes (Signed)
Subjective:    Patient ID: Tanya Meza, female    DOB: 1980/09/25, 34 y.o.   MRN: [id number]  HPI Tanya Meza is a 34 year old female that returns to the office today for a fever that she has had for 4 days, generalized body aches, and headache. She reports that the highest fever was yesterday morning at 102F.  It awoke her from her sleep. She denies any ear pain, ear discharge, runny nose, sneezing, sore throat. She does have an associated dry cough. She denies any shortness of breath, hemoptysis, chest pain. She does report that 2 of her 3 children have had similar symptoms but they're fever subsided in a couple of days whereas hers has lasted for 4 days. She denies any tick bites or other bug bites.   Review of Systems  Constitutional: Positive for fever, chills and fatigue. Negative for diaphoresis, activity change, appetite change and unexpected weight change.  HENT: Positive for congestion. Negative for ear discharge, ear pain, postnasal drip, rhinorrhea, sinus pressure, sneezing, sore throat, tinnitus, trouble swallowing and voice change.   Eyes: Negative for photophobia, pain, discharge, redness, itching and visual disturbance.  Respiratory: Positive for cough. Negative for choking, chest tightness, shortness of breath and wheezing.   Cardiovascular: Negative for chest pain and palpitations.  Gastrointestinal: Positive for nausea. Negative for vomiting, abdominal pain, diarrhea and constipation.  Musculoskeletal: Positive for arthralgias (generalized).  Allergic/Immunologic: Negative for environmental allergies, food allergies and immunocompromised state.  Neurological: Positive for headaches. Negative for syncope, weakness, light-headedness and numbness.  Hematological: Negative for adenopathy. Does not bruise/bleed easily.       Objective:   Physical Exam  Constitutional: She appears well-developed and well-nourished. No distress.  HENT:  Head: Normocephalic and  atraumatic.  Right Ear: Hearing, tympanic membrane, external ear and ear canal normal.  Left Ear: Hearing, tympanic membrane, external ear and ear canal normal.  Nose: Nose normal.  Mouth/Throat: Uvula is midline, oropharynx is clear and moist and mucous membranes are normal. No oropharyngeal exudate.  Eyes: Conjunctivae are normal. Pupils are equal, round, and reactive to light. Right eye exhibits no discharge. Left eye exhibits no discharge. No scleral icterus.  Neck: Normal range of motion. Neck supple. No tracheal deviation present. No thyromegaly present.  Cardiovascular: Normal rate, regular rhythm and normal heart sounds.  Exam reveals no gallop and no friction rub.   No murmur heard. Pulmonary/Chest: Effort normal and breath sounds normal. No stridor. No respiratory distress. She has no wheezes. She has no rales.  Lymphadenopathy:    She has no cervical adenopathy.  Skin: Skin is warm and dry. She is not diaphoretic.  Vitals reviewed.         Assessment & Plan:  1. Fever, unspecified fever cause Rapid flu was negative.  Will treat as walking pneumonia.  If no improvements will get CXR.  Not obtained today because no respiratory distress or abnormal lung sounds and she is self pay and did not think she could afford it.  May use ibuprofen or tylenol for fever reduction.  Stay well hydrated.  She is to call if symptoms worsen or do not improve. - POCT Influenza A/B - levofloxacin (LEVAQUIN) 750 MG tablet; Take 1 tablet (750 mg total) by mouth daily.  Dispense: 10 tablet; Refill: 0  2. Cough Stay well hydrated.  May take Robitussin or delsym.  3. Headache, unspecified headache type See above.  4. Body aches See above.  5. Weakness See above.   6. Antibiotic-induced yeast  infection She does get yeast infections with antibiotic use so diflucan sent in as prophylaxis. - fluconazole (DIFLUCAN) 150 MG tablet; Take 1 tablet (150 mg total) by mouth once.  Dispense: 3 tablet;  Refill: 0

## 2015-09-28 ENCOUNTER — Telehealth: Payer: Self-pay

## 2015-09-28 NOTE — Telephone Encounter (Signed)
Yes I saw note. Thanks for f/u. Can await call back.

## 2015-09-28 NOTE — Telephone Encounter (Signed)
Patient returned call. She reports that the sore throat that she was having in one spot at the bottom of her throat it went away and that she is doing much better.  Thanks,  -Tanya Meza

## 2015-09-28 NOTE — Telephone Encounter (Signed)
Called patient to follow-up on her symptoms, but no answer. LMTCB.Patient called the [facility] call center yesterday with c/o having a sore throat. It worse in the AM.  Thanks,  -Kaelum Kissick

## 2015-10-31 ENCOUNTER — Telehealth: Payer: Self-pay | Admitting: *Deleted

## 2015-10-31 NOTE — Telephone Encounter (Signed)
-----   Message from Olevia BowensJacinda S Battle sent at 10/31/2015  8:24 AM EDT ----- Regarding: Advise Contact: [phone] Wants to talk to someone about AUB,  Last appt was in 09/2013

## 2015-10-31 NOTE — Telephone Encounter (Signed)
Called pt back . She states she had cycle on [place] Patricks day and then another cycle 3 weeks later but it was very light and dark colored. She has not had a cycle since then. Pt states she has no insurance and would like phone advice. Advised pt to take HPT and if negative then she would need to be evaluated in office further to help determine what the cause is for AUB. Transferred pt to front desk to speak about cost for appt.

## 2015-11-18 ENCOUNTER — Ambulatory Visit (INDEPENDENT_AMBULATORY_CARE_PROVIDER_SITE_OTHER): Payer: Self-pay | Admitting: Physician Assistant

## 2015-11-18 VITALS — BP 110/76 | HR 78 | Temp 98.9°F | Resp 14 | Wt 178.0 lb

## 2015-11-18 DIAGNOSIS — R600 Localized edema: Secondary | ICD-10-CM

## 2015-11-18 DIAGNOSIS — K5900 Constipation, unspecified: Secondary | ICD-10-CM

## 2015-11-18 MED ORDER — FUROSEMIDE 20 MG PO TABS: 20.0000 mg | ORAL_TABLET | Freq: Every day | ORAL | Status: DC | PRN

## 2015-11-18 NOTE — Patient Instructions (Signed)
Edema Edema is an abnormal buildup of fluids in your bodytissues. Edema is somewhatdependent on gravity to pull the fluid to the lowest place in your body. That makes the condition more common in the legs and thighs (lower extremities). Painless swelling of the feet and ankles is common and becomes more likely as you get older. It is also common in looser tissues, like around your eyes.  When the affected area is squeezed, the fluid may move out of that spot and leave a dent for a few moments. This dent is called pitting.  CAUSES  There are many possible causes of edema. Eating too much salt and being on your feet or sitting for a long time can cause edema in your legs and ankles. Hot weather may make edema worse. Common medical causes of edema include:  Heart failure.  Liver disease.  Kidney disease.  Weak blood vessels in your legs.  Cancer.  An injury.  Pregnancy.  Some medications.  Obesity. SYMPTOMS  Edema is usually painless.Your skin may look swollen or shiny.  DIAGNOSIS  Your health care provider may be able to diagnose edema by asking about your medical history and doing a physical exam. You may need to have tests such as X-rays, an electrocardiogram, or blood tests to check for medical conditions that may cause edema.  TREATMENT  Edema treatment depends on the cause. If you have heart, liver, or kidney disease, you need the treatment appropriate for these conditions. General treatment may include:  Elevation of the affected body part above the level of your heart.  Compression of the affected body part. Pressure from elastic bandages or support stockings squeezes the tissues and forces fluid back into the blood vessels. This keeps fluid from entering the tissues.  Restriction of fluid and salt intake.  Use of a water pill (diuretic). These medications are appropriate only for some types of edema. They pull fluid out of your body and make you urinate more often. This  gets rid of fluid and reduces swelling, but diuretics can have side effects. Only use diuretics as directed by your health care provider. HOME CARE INSTRUCTIONS   Keep the affected body part above the level of your heart when you are lying down.   Do not sit still or stand for prolonged periods.   Do not put anything directly under your knees when lying down.  Do not wear constricting clothing or garters on your upper legs.   Exercise your legs to work the fluid back into your blood vessels. This may help the swelling go down.   Wear elastic bandages or support stockings to reduce ankle swelling as directed by your health care provider.   Eat a low-salt diet to reduce fluid if your health care provider recommends it.   Only take medicines as directed by your health care provider. SEEK MEDICAL CARE IF:   Your edema is not responding to treatment.  You have heart, liver, or kidney disease and notice symptoms of edema.  You have edema in your legs that does not improve after elevating them.   You have sudden and unexplained weight gain. SEEK IMMEDIATE MEDICAL CARE IF:   You develop shortness of breath or chest pain.   You cannot breathe when you lie down.  You develop pain, redness, or warmth in the swollen areas.   You have heart, liver, or kidney disease and suddenly get edema.  You have a fever and your symptoms suddenly get worse. MAKE SURE YOU:     Understand these instructions.  Will watch your condition.  Will get help right away if you are not doing well or get worse.   This information is not intended to replace advice given to you by your health care provider. Make sure you discuss any questions you have with your health care provider.   Document Released: 06/04/2005 Document Revised: 06/25/2014 Document Reviewed: 03/27/2013 Elsevier Interactive Patient Education 2016 [employer].  Constipation, Adult Constipation is when a person has fewer than  three bowel movements a week, has difficulty having a bowel movement, or has stools that are dry, hard, or larger than normal. As people grow older, constipation is more common. A low-fiber diet, not taking in enough fluids, and taking certain medicines may make constipation worse.  CAUSES   Certain medicines, such as antidepressants, pain medicine, iron supplements, antacids, and water pills.   Certain diseases, such as diabetes, irritable bowel syndrome (IBS), thyroid disease, or depression.   Not drinking enough water.   Not eating enough fiber-rich foods.   Stress or travel.   Lack of physical activity or exercise.   Ignoring the urge to have a bowel movement.   Using laxatives too much.  SIGNS AND SYMPTOMS   Having fewer than three bowel movements a week.   Straining to have a bowel movement.   Having stools that are hard, dry, or larger than normal.   Feeling full or bloated.   Pain in the lower abdomen.   Not feeling relief after having a bowel movement.  DIAGNOSIS  Your health care provider will take a medical history and perform a physical exam. Further testing may be done for severe constipation. Some tests may include:  A barium enema X-ray to examine your rectum, colon, and, sometimes, your small intestine.   A sigmoidoscopy to examine your lower colon.   A colonoscopy to examine your entire colon. TREATMENT  Treatment will depend on the severity of your constipation and what is causing it. Some dietary treatments include drinking more fluids and eating more fiber-rich foods. Lifestyle treatments may include regular exercise. If these diet and lifestyle recommendations do not help, your health care provider may recommend taking over-the-counter laxative medicines to help you have bowel movements. Prescription medicines may be prescribed if over-the-counter medicines do not work.  HOME CARE INSTRUCTIONS   Eat foods that have a lot of fiber, such  as fruits, vegetables, whole grains, and beans.  Limit foods high in fat and processed sugars, such as french fries, hamburgers, cookies, candies, and soda.   A fiber supplement may be added to your diet if you cannot get enough fiber from foods.   Drink enough fluids to keep your urine clear or pale yellow.   Exercise regularly or as directed by your health care provider.   Go to the restroom when you have the urge to go. Do not hold it.   Only take over-the-counter or prescription medicines as directed by your health care provider. Do not take other medicines for constipation without talking to your health care provider first.  SEEK IMMEDIATE MEDICAL CARE IF:   You have bright red blood in your stool.   Your constipation lasts for more than 4 days or gets worse.   You have abdominal or rectal pain.   You have thin, pencil-like stools.   You have unexplained weight loss. MAKE SURE YOU:   Understand these instructions.  Will watch your condition.  Will get help right away if you are not doing  well or get worse.   This information is not intended to replace advice given to you by your health care provider. Make sure you discuss any questions you have with your health care provider.   Document Released: 03/02/2004 Document Revised: 06/25/2014 Document Reviewed: 03/16/2013 Elsevier Interactive Patient Education 2016 Elsevier Inc.  High-Fiber Diet Fiber, also called dietary fiber, is a type of carbohydrate found in fruits, vegetables, whole grains, and beans. A high-fiber diet can have many health benefits. Your health care provider may recommend a high-fiber diet to help:  Prevent constipation. Fiber can make your bowel movements more regular.  Lower your cholesterol.  Relieve hemorrhoids, uncomplicated diverticulosis, or irritable bowel syndrome.  Prevent overeating as part of a weight-loss plan.  Prevent heart disease, type 2 diabetes, and certain  cancers. WHAT IS MY PLAN? The recommended daily intake of fiber includes:  38 grams for men under age 37.  30 grams for men over age 37.  25 grams for women under age 37.  21 grams for women over age 37. You can get the recommended daily intake of dietary fiber by eating a variety of fruits, vegetables, grains, and beans. Your health care provider may also recommend a fiber supplement if it is not possible to get enough fiber through your diet. WHAT DO I NEED TO KNOW ABOUT A HIGH-FIBER DIET?  Fiber supplements have not been widely studied for their effectiveness, so it is better to get fiber through food sources.  Always check the fiber content on thenutrition facts label of any prepackaged food. Look for foods that contain at least 5 grams of fiber per serving.  Ask your dietitian if you have questions about specific foods that are related to your condition, especially if those foods are not listed in the following section.  Increase your daily fiber consumption gradually. Increasing your intake of dietary fiber too quickly may cause bloating, cramping, or gas.  Drink plenty of water. Water helps you to digest fiber. WHAT FOODS CAN I EAT? Grains Whole-grain breads. Multigrain cereal. Oats and oatmeal. Brown rice. Barley. Bulgur wheat. Millet. Bran muffins. Popcorn. Rye wafer crackers. Vegetables Sweet potatoes. Spinach. Kale. Artichokes. Cabbage. Broccoli. Green peas. Carrots. Squash. Fruits Berries. Pears. Apples. Oranges. Avocados. Prunes and raisins. Dried figs. Meats and Other Protein Sources Navy, kidney, pinto, and soy beans. Split peas. Lentils. Nuts and seeds. Dairy Fiber-fortified yogurt. Beverages Fiber-fortified soy milk. Fiber-fortified orange juice. Other Fiber bars. The items listed above may not be a complete list of recommended foods or beverages. Contact your dietitian for more options. WHAT FOODS ARE NOT RECOMMENDED? Grains White bread. Pasta made with  refined flour. White rice. Vegetables Fried potatoes. Canned vegetables. Well-cooked vegetables.  Fruits Fruit juice. Cooked, strained fruit. Meats and Other Protein Sources Fatty cuts of meat. Fried Environmental education officerpoultry or fried fish. Dairy Milk. Yogurt. Cream cheese. Sour cream. Beverages Soft drinks. Other Cakes and pastries. Butter and oils. The items listed above may not be a complete list of foods and beverages to avoid. Contact your dietitian for more information. WHAT ARE SOME TIPS FOR INCLUDING HIGH-FIBER FOODS IN MY DIET?  Eat a wide variety of high-fiber foods.  Make sure that half of all grains consumed each day are whole grains.  Replace breads and cereals made from refined flour or white flour with whole-grain breads and cereals.  Replace white rice with brown rice, bulgur wheat, or millet.  Start the day with a breakfast that is high in fiber, such as a cereal that  contains at least 5 grams of fiber per serving.  Use beans in place of meat in soups, salads, or pasta.  Eat high-fiber snacks, such as berries, raw vegetables, nuts, or popcorn.   This information is not intended to replace advice given to you by your health care provider. Make sure you discuss any questions you have with your health care provider.   Document Released: 06/04/2005 Document Revised: 06/25/2014 Document Reviewed: 11/17/2013 Elsevier Interactive Patient Education [employer]

## 2015-11-18 NOTE — Progress Notes (Signed)
Patient ID: Tanya Meza, female   DOB: 1980-08-17, 35 y.o.   MRN: [id number]       Patient: Tanya Meza Female    DOB: 02-Oct-1980   35 y.o.   MRN: [id number] Visit Date: 11/18/2015  Today's Provider: Margaretann Loveless, PA-C   Chief Complaint  Patient presents with  . Leg Swelling    X 1 week.    Subjective:    HPI Patient comes in today c/o swelling in both ankles. She reports that she has had symptoms for about 1 week, but it is at its worse today. Patient describes swelling as "tightness" in her ankles. Patient denies any dizziness, headache, shortness of breath, or redness. Patient reports that she has not had any changes in medications or her diet. She does report just finishing a 6-week boot camp that was high intensity at the same time she noticed the swelling. Denies any urinary changes or darkening of urine in color.    Allergies  Allergen Reactions  . Latex Swelling    Swelling- around catheter   Previous Medications   IBUPROFEN (ADVIL,MOTRIN) 600 MG TABLET    Take 1 tablet (600 mg total) by mouth every 6 (six) hours as needed for pain.   RANITIDINE (ZANTAC) 150 MG TABLET    Take 150 mg by mouth as needed for heartburn. Reported on 11/18/2015    Review of Systems  Constitutional: Negative.   Respiratory: Negative.   Cardiovascular: Positive for leg swelling. Negative for chest pain and palpitations.  Gastrointestinal: Negative.   Genitourinary: Negative.   Musculoskeletal: Negative.   Neurological: Negative for dizziness, syncope, weakness, light-headedness, numbness and headaches.    Social History  Substance Use Topics  . Smoking status: Never Smoker   . Smokeless tobacco: Never Used  . Alcohol Use: Yes     Comment: social   Objective:   BP 110/76 mmHg  Pulse 78  Temp(Src) 98.9 F (37.2 C)  Resp 14  Wt 178 lb (80.74 kg)  Physical Exam  Constitutional: She appears well-developed and well-nourished. No distress.  Neck: Normal range of motion.  Neck supple.  Cardiovascular: Normal rate, regular rhythm and normal heart sounds.  Exam reveals no gallop and no friction rub.   No murmur heard. Pulmonary/Chest: Effort normal and breath sounds normal. No respiratory distress. She has no wheezes. She has no rales.  Abdominal: Soft. Bowel sounds are normal. There is no tenderness.  Musculoskeletal: Normal range of motion. She exhibits edema (trace lower extremity edema bilaterally). She exhibits no tenderness.       Right lower leg: She exhibits no tenderness.       Left lower leg: She exhibits no tenderness.  Negative Homan's sign  Skin: She is not diaphoretic.  Vitals reviewed.       Assessment & Plan:     1. Bilateral edema of lower extremity Advised to make sure to be drinking plenty of water. Will add furosemide as below x 1-2 weeks until swelling decreases then she may use as needed. She is to call if urine darkens in color. Discussed checking labs but did not at this time due to patient not having insurance. She is to call if swelling does not improve, symptoms worsen, or if she develops redness, pain or SOB. - furosemide (LASIX) 20 MG tablet; Take 1 tablet (20 mg total) by mouth daily as needed. For lower extremity edema as needed  Dispense: 30 tablet; Refill: 0  2. Constipation, unspecified constipation type Increase fluids.  Try daily fiber supplement (benefiber or metamucil), may use probiotics. Use miralax when needed. Call if symptoms worsen.       Margaretann LovelessJennifer M Nohlan Burdin, PA-C  [facility]

## 2015-11-22 ENCOUNTER — Encounter: Payer: Self-pay | Admitting: Family Medicine

## 2015-11-22 ENCOUNTER — Ambulatory Visit (INDEPENDENT_AMBULATORY_CARE_PROVIDER_SITE_OTHER): Payer: Self-pay | Admitting: Family Medicine

## 2015-11-22 VITALS — BP 107/70 | HR 66 | Resp 16 | Ht 64.0 in | Wt 174.0 lb

## 2015-11-22 DIAGNOSIS — E282 Polycystic ovarian syndrome: Secondary | ICD-10-CM | POA: Insufficient documentation

## 2015-11-22 DIAGNOSIS — Z124 Encounter for screening for malignant neoplasm of cervix: Secondary | ICD-10-CM

## 2015-11-22 DIAGNOSIS — Z01419 Encounter for gynecological examination (general) (routine) without abnormal findings: Secondary | ICD-10-CM

## 2015-11-22 DIAGNOSIS — Z1151 Encounter for screening for human papillomavirus (HPV): Secondary | ICD-10-CM

## 2015-11-22 LAB — CBC
HCT: 44.7 % (ref 35.0–45.0)
Hemoglobin: 14.5 g/dL (ref 11.7–15.5)
MCH: 28 pg (ref 27.0–33.0)
MCHC: 32.4 g/dL (ref 32.0–36.0)
MCV: 86.3 fL (ref 80.0–100.0)
MPV: 11.1 fL (ref 7.5–12.5)
Platelets: 345 10*3/uL (ref 140–400)
RBC: 5.18 MIL/uL — ABNORMAL HIGH (ref 3.80–5.10)
RDW: 14.2 % (ref 11.0–15.0)
WBC: 7.5 10*3/uL (ref 3.8–10.8)

## 2015-11-22 NOTE — Patient Instructions (Signed)
Preventive Care for Adults, Female A healthy lifestyle and preventive care can promote health and wellness. Preventive health guidelines for women include the following key practices.  A routine yearly physical is a good way to check with your health care provider about your health and preventive screening. It is a chance to share any concerns and updates on your health and to receive a thorough exam.  Visit your dentist for a routine exam and preventive care every 6 months. Brush your teeth twice a day and floss once a day. Good oral hygiene prevents tooth decay and gum disease.  The frequency of eye exams is based on your age, health, family medical history, use of contact lenses, and other factors. Follow your health care provider's recommendations for frequency of eye exams.  Eat a healthy diet. Foods like vegetables, fruits, whole grains, low-fat dairy products, and lean protein foods contain the nutrients you need without too many calories. Decrease your intake of foods high in solid fats, added sugars, and salt. Eat the right amount of calories for you.Get information about a proper diet from your health care provider, if necessary.  Regular physical exercise is one of the most important things you can do for your health. Most adults should get at least 150 minutes of moderate-intensity exercise (any activity that increases your heart rate and causes you to sweat) each week. In addition, most adults need muscle-strengthening exercises on 2 or more days a week.  Maintain a healthy weight. The body mass index (BMI) is a screening tool to identify possible weight problems. It provides an estimate of body fat based on height and weight. Your health care provider can find your BMI and can help you achieve or maintain a healthy weight.For adults 20 years and older:  A BMI below 18.5 is considered underweight.  A BMI of 18.5 to 24.9 is normal.  A BMI of 25 to 29.9 is considered overweight.  A  BMI of 30 and above is considered obese.  Maintain normal blood lipids and cholesterol levels by exercising and minimizing your intake of saturated fat. Eat a balanced diet with plenty of fruit and vegetables. Blood tests for lipids and cholesterol should begin at age 45 and be repeated every 5 years. If your lipid or cholesterol levels are high, you are over 50, or you are at high risk for heart disease, you may need your cholesterol levels checked more frequently.Ongoing high lipid and cholesterol levels should be treated with medicines if diet and exercise are not working.  If you smoke, find out from your health care provider how to quit. If you do not use tobacco, do not start.  Lung cancer screening is recommended for adults aged 45-80 years who are at high risk for developing lung cancer because of a history of smoking. A yearly low-dose CT scan of the lungs is recommended for people who have at least a 30-pack-year history of smoking and are a current smoker or have quit within the past 15 years. A pack year of smoking is smoking an average of 1 pack of cigarettes a day for 1 year (for example: 1 pack a day for 30 years or 2 packs a day for 15 years). Yearly screening should continue until the smoker has stopped smoking for at least 15 years. Yearly screening should be stopped for people who develop a health problem that would prevent them from having lung cancer treatment.  If you are pregnant, do not drink alcohol. If you are  breastfeeding, be very cautious about drinking alcohol. If you are not pregnant and choose to drink alcohol, do not have more than 1 drink per day. One drink is considered to be 12 ounces (355 mL) of beer, 5 ounces (148 mL) of wine, or 1.5 ounces (44 mL) of liquor.  Avoid use of street drugs. Do not share needles with anyone. Ask for help if you need support or instructions about stopping the use of drugs.  High blood pressure causes heart disease and increases the risk  of stroke. Your blood pressure should be checked at least every 1 to 2 years. Ongoing high blood pressure should be treated with medicines if weight loss and exercise do not work.  If you are 55-79 years old, ask your health care provider if you should take aspirin to prevent strokes.  Diabetes screening is done by taking a blood sample to check your blood glucose level after you have not eaten for a certain period of time (fasting). If you are not overweight and you do not have risk factors for diabetes, you should be screened once every 3 years starting at age 45. If you are overweight or obese and you are 40-70 years of age, you should be screened for diabetes every year as part of your cardiovascular risk assessment.  Breast cancer screening is essential preventive care for women. You should practice "breast self-awareness." This means understanding the normal appearance and feel of your breasts and may include breast self-examination. Any changes detected, no matter how small, should be reported to a health care provider. Women in their 20s and 30s should have a clinical breast exam (CBE) by a health care provider as part of a regular health exam every 1 to 3 years. After age 40, women should have a CBE every year. Starting at age 40, women should consider having a mammogram (breast X-ray test) every year. Women who have a family history of breast cancer should talk to their health care provider about genetic screening. Women at a high risk of breast cancer should talk to their health care providers about having an MRI and a mammogram every year.  Breast cancer gene (BRCA)-related cancer risk assessment is recommended for women who have family members with BRCA-related cancers. BRCA-related cancers include breast, ovarian, tubal, and peritoneal cancers. Having family members with these cancers may be associated with an increased risk for harmful changes (mutations) in the breast cancer genes BRCA1 and  BRCA2. Results of the assessment will determine the need for genetic counseling and BRCA1 and BRCA2 testing.  Your health care provider may recommend that you be screened regularly for cancer of the pelvic organs (ovaries, uterus, and vagina). This screening involves a pelvic examination, including checking for microscopic changes to the surface of your cervix (Pap test). You may be encouraged to have this screening done every 3 years, beginning at age 21.  For women ages 30-65, health care providers may recommend pelvic exams and Pap testing every 3 years, or they may recommend the Pap and pelvic exam, combined with testing for human papilloma virus (HPV), every 5 years. Some types of HPV increase your risk of cervical cancer. Testing for HPV may also be done on women of any age with unclear Pap test results.  Other health care providers may not recommend any screening for nonpregnant women who are considered low risk for pelvic cancer and who do not have symptoms. Ask your health care provider if a screening pelvic exam is right for   you.  If you have had past treatment for cervical cancer or a condition that could lead to cancer, you need Pap tests and screening for cancer for at least 20 years after your treatment. If Pap tests have been discontinued, your risk factors (such as having a new sexual partner) need to be reassessed to determine if screening should resume. Some women have medical problems that increase the chance of getting cervical cancer. In these cases, your health care provider may recommend more frequent screening and Pap tests.  Colorectal cancer can be detected and often prevented. Most routine colorectal cancer screening begins at the age of 50 years and continues through age 75 years. However, your health care provider may recommend screening at an earlier age if you have risk factors for colon cancer. On a yearly basis, your health care provider may provide home test kits to check  for hidden blood in the stool. Use of a small camera at the end of a tube, to directly examine the colon (sigmoidoscopy or colonoscopy), can detect the earliest forms of colorectal cancer. Talk to your health care provider about this at age 50, when routine screening begins. Direct exam of the colon should be repeated every 5-10 years through age 75 years, unless early forms of precancerous polyps or small growths are found.  People who are at an increased risk for hepatitis B should be screened for this virus. You are considered at high risk for hepatitis B if:  You were born in a country where hepatitis B occurs often. Talk with your health care provider about which countries are considered high risk.  Your parents were born in a high-risk country and you have not received a shot to protect against hepatitis B (hepatitis B vaccine).  You have HIV or AIDS.  You use needles to inject street drugs.  You live with, or have sex with, someone who has hepatitis B.  You get hemodialysis treatment.  You take certain medicines for conditions like cancer, organ transplantation, and autoimmune conditions.  Hepatitis C blood testing is recommended for all people born from 1945 through 1965 and any individual with known risks for hepatitis C.  Practice safe sex. Use condoms and avoid high-risk sexual practices to reduce the spread of sexually transmitted infections (STIs). STIs include gonorrhea, chlamydia, syphilis, trichomonas, herpes, HPV, and human immunodeficiency virus (HIV). Herpes, HIV, and HPV are viral illnesses that have no cure. They can result in disability, cancer, and death.  You should be screened for sexually transmitted illnesses (STIs) including gonorrhea and chlamydia if:  You are sexually active and are younger than 24 years.  You are older than 24 years and your health care provider tells you that you are at risk for this type of infection.  Your sexual activity has changed  since you were last screened and you are at an increased risk for chlamydia or gonorrhea. Ask your health care provider if you are at risk.  If you are at risk of being infected with HIV, it is recommended that you take a prescription medicine daily to prevent HIV infection. This is called preexposure prophylaxis (PrEP). You are considered at risk if:  You are sexually active and do not regularly use condoms or know the HIV status of your partner(s).  You take drugs by injection.  You are sexually active with a partner who has HIV.  Talk with your health care provider about whether you are at high risk of being infected with HIV. If   you choose to begin PrEP, you should first be tested for HIV. You should then be tested every 3 months for as long as you are taking PrEP.  Osteoporosis is a disease in which the bones lose minerals and strength with aging. This can result in serious bone fractures or breaks. The risk of osteoporosis can be identified using a bone density scan. Women ages 67 years and over and women at risk for fractures or osteoporosis should discuss screening with their health care providers. Ask your health care provider whether you should take a calcium supplement or vitamin D to reduce the rate of osteoporosis.  Menopause can be associated with physical symptoms and risks. Hormone replacement therapy is available to decrease symptoms and risks. You should talk to your health care provider about whether hormone replacement therapy is right for you.  Use sunscreen. Apply sunscreen liberally and repeatedly throughout the day. You should seek shade when your shadow is shorter than you. Protect yourself by wearing long sleeves, pants, a wide-brimmed hat, and sunglasses year round, whenever you are outdoors.  Once a month, do a whole body skin exam, using a mirror to look at the skin on your back. Tell your health care provider of new moles, moles that have irregular borders, moles that  are larger than a pencil eraser, or moles that have changed in shape or color.  Stay current with required vaccines (immunizations).  Influenza vaccine. All adults should be immunized every year.  Tetanus, diphtheria, and acellular pertussis (Td, Tdap) vaccine. Pregnant women should receive 1 dose of Tdap vaccine during each pregnancy. The dose should be obtained regardless of the length of time since the last dose. Immunization is preferred during the 27th-36th week of gestation. An adult who has not previously received Tdap or who does not know her vaccine status should receive 1 dose of Tdap. This initial dose should be followed by tetanus and diphtheria toxoids (Td) booster doses every 10 years. Adults with an unknown or incomplete history of completing a 3-dose immunization series with Td-containing vaccines should begin or complete a primary immunization series including a Tdap dose. Adults should receive a Td booster every 10 years.  Varicella vaccine. An adult without evidence of immunity to varicella should receive 2 doses or a second dose if she has previously received 1 dose. Pregnant females who do not have evidence of immunity should receive the first dose after pregnancy. This first dose should be obtained before leaving the health care facility. The second dose should be obtained 4-8 weeks after the first dose.  Human papillomavirus (HPV) vaccine. Females aged 13-26 years who have not received the vaccine previously should obtain the 3-dose series. The vaccine is not recommended for use in pregnant females. However, pregnancy testing is not needed before receiving a dose. If a female is found to be pregnant after receiving a dose, no treatment is needed. In that case, the remaining doses should be delayed until after the pregnancy. Immunization is recommended for any person with an immunocompromised condition through the age of 61 years if she did not get any or all doses earlier. During the  3-dose series, the second dose should be obtained 4-8 weeks after the first dose. The third dose should be obtained 24 weeks after the first dose and 16 weeks after the second dose.  Zoster vaccine. One dose is recommended for adults aged 30 years or older unless certain conditions are present.  Measles, mumps, and rubella (MMR) vaccine. Adults born  before 1957 generally are considered immune to measles and mumps. Adults born in 1957 or later should have 1 or more doses of MMR vaccine unless there is a contraindication to the vaccine or there is laboratory evidence of immunity to each of the three diseases. A routine second dose of MMR vaccine should be obtained at least 28 days after the first dose for students attending postsecondary schools, health care workers, or international travelers. People who received inactivated measles vaccine or an unknown type of measles vaccine during 1963-1967 should receive 2 doses of MMR vaccine. People who received inactivated mumps vaccine or an unknown type of mumps vaccine before 1979 and are at high risk for mumps infection should consider immunization with 2 doses of MMR vaccine. For females of childbearing age, rubella immunity should be determined. If there is no evidence of immunity, females who are not pregnant should be vaccinated. If there is no evidence of immunity, females who are pregnant should delay immunization until after pregnancy. Unvaccinated health care workers born before 1957 who lack laboratory evidence of measles, mumps, or rubella immunity or laboratory confirmation of disease should consider measles and mumps immunization with 2 doses of MMR vaccine or rubella immunization with 1 dose of MMR vaccine.  Pneumococcal 13-valent conjugate (PCV13) vaccine. When indicated, a person who is uncertain of his immunization history and has no record of immunization should receive the PCV13 vaccine. All adults 65 years of age and older should receive this  vaccine. An adult aged 19 years or older who has certain medical conditions and has not been previously immunized should receive 1 dose of PCV13 vaccine. This PCV13 should be followed with a dose of pneumococcal polysaccharide (PPSV23) vaccine. Adults who are at high risk for pneumococcal disease should obtain the PPSV23 vaccine at least 8 weeks after the dose of PCV13 vaccine. Adults older than 37 years of age who have normal immune system function should obtain the PPSV23 vaccine dose at least 1 year after the dose of PCV13 vaccine.  Pneumococcal polysaccharide (PPSV23) vaccine. When PCV13 is also indicated, PCV13 should be obtained first. All adults aged 65 years and older should be immunized. An adult younger than age 65 years who has certain medical conditions should be immunized. Any person who resides in a nursing home or long-term care facility should be immunized. An adult smoker should be immunized. People with an immunocompromised condition and certain other conditions should receive both PCV13 and PPSV23 vaccines. People with human immunodeficiency virus (HIV) infection should be immunized as soon as possible after diagnosis. Immunization during chemotherapy or radiation therapy should be avoided. Routine use of PPSV23 vaccine is not recommended for American Indians, [place] Natives, or people younger than 65 years unless there are medical conditions that require PPSV23 vaccine. When indicated, people who have unknown immunization and have no record of immunization should receive PPSV23 vaccine. One-time revaccination 5 years after the first dose of PPSV23 is recommended for people aged 19-64 years who have chronic kidney failure, nephrotic syndrome, asplenia, or immunocompromised conditions. People who received 1-2 doses of PPSV23 before age 65 years should receive another dose of PPSV23 vaccine at age 65 years or later if at least 5 years have passed since the previous dose. Doses of PPSV23 are not  needed for people immunized with PPSV23 at or after age 65 years.  Meningococcal vaccine. Adults with asplenia or persistent complement component deficiencies should receive 2 doses of quadrivalent meningococcal conjugate (MenACWY-D) vaccine. The doses should be obtained   at least 2 months apart. Microbiologists working with certain meningococcal bacteria, [place] recruits, people at risk during an outbreak, and people who travel to or live in countries with a high rate of meningitis should be immunized. A first-year college student up through age 34 years who is living in a residence hall should receive a dose if she did not receive a dose on or after her 16th birthday. Adults who have certain high-risk conditions should receive one or more doses of vaccine.  Hepatitis A vaccine. Adults who wish to be protected from this disease, have certain high-risk conditions, work with hepatitis A-infected animals, work in hepatitis A research labs, or travel to or work in countries with a high rate of hepatitis A should be immunized. Adults who were previously unvaccinated and who anticipate close contact with an international adoptee during the first 60 days after arrival in the [place] from a country with a high rate of hepatitis A should be immunized.  Hepatitis B vaccine. Adults who wish to be protected from this disease, have certain high-risk conditions, may be exposed to blood or other infectious body fluids, are household contacts or sex partners of hepatitis B positive people, are clients or workers in certain care facilities, or travel to or work in countries with a high rate of hepatitis B should be immunized.  Haemophilus influenzae type b (Hib) vaccine. A previously unvaccinated person with asplenia or sickle cell disease or having a scheduled splenectomy should receive 1 dose of Hib vaccine. Regardless of previous immunization, a recipient of a hematopoietic stem cell transplant should receive a  3-dose series 6-12 months after her successful transplant. Hib vaccine is not recommended for adults with HIV infection. Preventive Services / Frequency Ages 35 to 4 years  Blood pressure check.** / Every 3-5 years.  Lipid and cholesterol check.** / Every 5 years beginning at age 60.  Clinical breast exam.** / Every 3 years for women in their 71s and 10s.  BRCA-related cancer risk assessment.** / For women who have family members with a BRCA-related cancer (breast, ovarian, tubal, or peritoneal cancers).  Pap test.** / Every 2 years from ages 76 through 26. Every 3 years starting at age 61 through age 76 or 93 with a history of 3 consecutive normal Pap tests.  HPV screening.** / Every 3 years from ages 37 through ages 60 to 51 with a history of 3 consecutive normal Pap tests.  Hepatitis C blood test.** / For any individual with known risks for hepatitis C.  Skin self-exam. / Monthly.  Influenza vaccine. / Every year.  Tetanus, diphtheria, and acellular pertussis (Tdap, Td) vaccine.** / Consult your health care provider. Pregnant women should receive 1 dose of Tdap vaccine during each pregnancy. 1 dose of Td every 10 years.  Varicella vaccine.** / Consult your health care provider. Pregnant females who do not have evidence of immunity should receive the first dose after pregnancy.  HPV vaccine. / 3 doses over 6 months, if 93 and younger. The vaccine is not recommended for use in pregnant females. However, pregnancy testing is not needed before receiving a dose.  Measles, mumps, rubella (MMR) vaccine.** / You need at least 1 dose of MMR if you were born in 1957 or later. You may also need a 2nd dose. For females of childbearing age, rubella immunity should be determined. If there is no evidence of immunity, females who are not pregnant should be vaccinated. If there is no evidence of immunity, females who are  pregnant should delay immunization until after pregnancy.  Pneumococcal  13-valent conjugate (PCV13) vaccine.** / Consult your health care provider.  Pneumococcal polysaccharide (PPSV23) vaccine.** / 1 to 2 doses if you smoke cigarettes or if you have certain conditions.  Meningococcal vaccine.** / 1 dose if you are age 68 to 8 years and a Market researcher living in a residence hall, or have one of several medical conditions, you need to get vaccinated against meningococcal disease. You may also need additional booster doses.  Hepatitis A vaccine.** / Consult your health care provider.  Hepatitis B vaccine.** / Consult your health care provider.  Haemophilus influenzae type b (Hib) vaccine.** / Consult your health care provider. Ages 7 to 53 years  Blood pressure check.** / Every year.  Lipid and cholesterol check.** / Every 5 years beginning at age 25 years.  Lung cancer screening. / Every year if you are aged 11-80 years and have a 30-pack-year history of smoking and currently smoke or have quit within the past 15 years. Yearly screening is stopped once you have quit smoking for at least 15 years or develop a health problem that would prevent you from having lung cancer treatment.  Clinical breast exam.** / Every year after age 48 years.  BRCA-related cancer risk assessment.** / For women who have family members with a BRCA-related cancer (breast, ovarian, tubal, or peritoneal cancers).  Mammogram.** / Every year beginning at age 41 years and continuing for as long as you are in good health. Consult with your health care provider.  Pap test.** / Every 3 years starting at age 65 years through age 37 or 70 years with a history of 3 consecutive normal Pap tests.  HPV screening.** / Every 3 years from ages 72 years through ages 60 to 40 years with a history of 3 consecutive normal Pap tests.  Fecal occult blood test (FOBT) of stool. / Every year beginning at age 21 years and continuing until age 5 years. You may not need to do this test if you get  a colonoscopy every 10 years.  Flexible sigmoidoscopy or colonoscopy.** / Every 5 years for a flexible sigmoidoscopy or every 10 years for a colonoscopy beginning at age 35 years and continuing until age 48 years.  Hepatitis C blood test.** / For all people born from [birth year] through 1965 and any individual with known risks for hepatitis C.  Skin self-exam. / Monthly.  Influenza vaccine. / Every year.  Tetanus, diphtheria, and acellular pertussis (Tdap/Td) vaccine.** / Consult your health care provider. Pregnant women should receive 1 dose of Tdap vaccine during each pregnancy. 1 dose of Td every 10 years.  Varicella vaccine.** / Consult your health care provider. Pregnant females who do not have evidence of immunity should receive the first dose after pregnancy.  Zoster vaccine.** / 1 dose for adults aged 30 years or older.  Measles, mumps, rubella (MMR) vaccine.** / You need at least 1 dose of MMR if you were born in 1957 or later. You may also need a second dose. For females of childbearing age, rubella immunity should be determined. If there is no evidence of immunity, females who are not pregnant should be vaccinated. If there is no evidence of immunity, females who are pregnant should delay immunization until after pregnancy.  Pneumococcal 13-valent conjugate (PCV13) vaccine.** / Consult your health care provider.  Pneumococcal polysaccharide (PPSV23) vaccine.** / 1 to 2 doses if you smoke cigarettes or if you have certain conditions.  Meningococcal vaccine.** /  Consult your health care provider.  Hepatitis A vaccine.** / Consult your health care provider.  Hepatitis B vaccine.** / Consult your health care provider.  Haemophilus influenzae type b (Hib) vaccine.** / Consult your health care provider. Ages 64 years and over  Blood pressure check.** / Every year.  Lipid and cholesterol check.** / Every 5 years beginning at age 23 years.  Lung cancer screening. / Every year if you  are aged 16-80 years and have a 30-pack-year history of smoking and currently smoke or have quit within the past 15 years. Yearly screening is stopped once you have quit smoking for at least 15 years or develop a health problem that would prevent you from having lung cancer treatment.  Clinical breast exam.** / Every year after age 74 years.  BRCA-related cancer risk assessment.** / For women who have family members with a BRCA-related cancer (breast, ovarian, tubal, or peritoneal cancers).  Mammogram.** / Every year beginning at age 44 years and continuing for as long as you are in good health. Consult with your health care provider.  Pap test.** / Every 3 years starting at age 58 years through age 22 or 39 years with 3 consecutive normal Pap tests. Testing can be stopped between 65 and 70 years with 3 consecutive normal Pap tests and no abnormal Pap or HPV tests in the past 10 years.  HPV screening.** / Every 3 years from ages 64 years through ages 70 or 61 years with a history of 3 consecutive normal Pap tests. Testing can be stopped between 65 and 70 years with 3 consecutive normal Pap tests and no abnormal Pap or HPV tests in the past 10 years.  Fecal occult blood test (FOBT) of stool. / Every year beginning at age 40 years and continuing until age 27 years. You may not need to do this test if you get a colonoscopy every 10 years.  Flexible sigmoidoscopy or colonoscopy.** / Every 5 years for a flexible sigmoidoscopy or every 10 years for a colonoscopy beginning at age 7 years and continuing until age 32 years.  Hepatitis C blood test.** / For all people born from [birth year] through 1967 and any individual with known risks for hepatitis C.  Osteoporosis screening.** / A one-time screening for women ages 30 years and over and women at risk for fractures or osteoporosis.  Skin self-exam. / Monthly.  Influenza vaccine. / Every year.  Tetanus, diphtheria, and acellular pertussis (Tdap/Td)  vaccine.** / 1 dose of Td every 10 years.  Varicella vaccine.** / Consult your health care provider.  Zoster vaccine.** / 1 dose for adults aged 35 years or older.  Pneumococcal 13-valent conjugate (PCV13) vaccine.** / Consult your health care provider.  Pneumococcal polysaccharide (PPSV23) vaccine.** / 1 dose for all adults aged 46 years and older.  Meningococcal vaccine.** / Consult your health care provider.  Hepatitis A vaccine.** / Consult your health care provider.  Hepatitis B vaccine.** / Consult your health care provider.  Haemophilus influenzae type b (Hib) vaccine.** / Consult your health care provider. ** Family history and personal history of risk and conditions may change your health care provider's recommendations.   This information is not intended to replace advice given to you by your health care provider. Make sure you discuss any questions you have with your health care provider.   Document Released: 07/31/2001 Document Revised: 06/25/2014 Document Reviewed: 10/30/2010 Elsevier Interactive Patient Education [employer].

## 2015-11-22 NOTE — Progress Notes (Signed)
  Subjective:     Tanya Meza is a 35 y.o. female and is here for a comprehensive physical exam. The patient reports problems - abnormal bleeding. Has h/o PCOS and irregular cycles but has had some increased spotting between cycles and now seems improved. She reports clear vaginal discharge..  Social History   Social History  . Marital Status: Married    Spouse Name: N/A  . Number of Children: N/A  . Years of Education: N/A   Occupational History  . Not on file.   Social History Main Topics  . Smoking status: Never Smoker   . Smokeless tobacco: Never Used  . Alcohol Use: Yes     Comment: social  . Drug Use: No  . Sexual Activity:    Partners: Male    Birth Control/ Protection: Surgical     Comment: tubaligation.   Other Topics Concern  . Not on file   Social History Narrative   Health Maintenance  Topic Date Due  . PAP SMEAR  08/06/2015  . INFLUENZA VACCINE  01/17/2016  . TETANUS/TDAP  05/15/2021  . HIV Screening  Completed    The following portions of the patient's history were reviewed and updated as appropriate: allergies, current medications, past family history, past medical history, past social history, past surgical history and problem list.  Review of Systems Pertinent items noted in HPI and remainder of comprehensive ROS otherwise negative.   Objective:    BP 107/70 mmHg  Pulse 66  Resp 16  Ht 5\' 4"  (1.626 m)  Wt 174 lb (78.926 kg)  BMI 29.85 kg/m2  LMP 10/25/2015  Breastfeeding? No General appearance: alert, cooperative and appears stated age Head: Normocephalic, without obvious abnormality, atraumatic Neck: no adenopathy, supple, symmetrical, trachea midline and thyroid not enlarged, symmetric, no tenderness/mass/nodules Lungs: clear to auscultation bilaterally Breasts: normal appearance, no masses or tenderness Heart: regular rate and rhythm, S1, S2 normal, no murmur, click, rub or gallop Abdomen: soft, non-tender; bowel sounds normal; no  masses,  no organomegaly Pelvic: cervix normal in appearance, external genitalia normal, no adnexal masses or tenderness, no cervical motion tenderness, uterus normal size, shape, and consistency and vagina normal without discharge Extremities: extremities normal, atraumatic, no cyanosis or edema Pulses: 2+ and symmetric Skin: Skin color, texture, turgor normal. No rashes or lesions Lymph nodes: Cervical, supraclavicular, and axillary nodes normal. Neurologic: Grossly normal    Assessment:    Healthy female exam.      Plan:      Problem List Items Addressed This Visit      Unprioritized   PCOS (polycystic ovarian syndrome)    Patient reports some spotting. Advised of possible cyclical progesterone vs. OC's. She will let us know with MyChart.       Other Visit Diagnoses    Encounter for routine gynecological examination    -  Primary    Relevant Orders    CBC    Comprehensive metabolic panel    Lipid panel    TSH    Screening for malignant neoplasm of cervix        Relevant Orders    Cytology - PAP       See After Visit Summary for Counseling Recommendations

## 2015-11-22 NOTE — Assessment & Plan Note (Signed)
Patient reports some spotting. Advised of possible cyclical progesterone vs. OC's. She will let us know with MyChart.

## 2015-11-23 ENCOUNTER — Telehealth: Payer: Self-pay | Admitting: *Deleted

## 2015-11-23 LAB — COMPREHENSIVE METABOLIC PANEL
ALK PHOS: 62 U/L (ref 33–115)
ALT: 15 U/L (ref 6–29)
AST: 18 U/L (ref 10–30)
Albumin: 4.6 g/dL (ref 3.6–5.1)
BUN: 14 mg/dL (ref 7–25)
CHLORIDE: 101 mmol/L (ref 98–110)
CO2: 25 mmol/L (ref 20–31)
CREATININE: 0.82 mg/dL (ref 0.50–1.10)
Calcium: 8.9 mg/dL (ref 8.6–10.2)
Glucose, Bld: 79 mg/dL (ref 65–99)
Potassium: 4.1 mmol/L (ref 3.5–5.3)
SODIUM: 136 mmol/L (ref 135–146)
TOTAL PROTEIN: 7.5 g/dL (ref 6.1–8.1)
Total Bilirubin: 0.4 mg/dL (ref 0.2–1.2)

## 2015-11-23 LAB — LIPID PANEL
CHOLESTEROL: 205 mg/dL — AB (ref 125–200)
HDL: 64 mg/dL (ref >=46)
LDL Cholesterol: 119 mg/dL (ref <130)
Total CHOL/HDL Ratio: 3.2 Ratio (ref <=5.0)
Triglycerides: 109 mg/dL (ref <150)
VLDL: 22 mg/dL (ref <30)

## 2015-11-23 LAB — TSH: TSH: 2.84 mIU/L

## 2015-11-23 LAB — CYTOLOGY - PAP

## 2015-11-23 NOTE — Telephone Encounter (Signed)
-----   Message from Reva Boresanya S Pratt, MD sent at 11/23/2015  7:57 AM EDT ----- Labs are normal, please inform pt.

## 2015-11-23 NOTE — Telephone Encounter (Signed)
Called pt to adv labs are normal - pt expressed understanding.

## 2016-07-31 ENCOUNTER — Ambulatory Visit (INDEPENDENT_AMBULATORY_CARE_PROVIDER_SITE_OTHER): Payer: 59 | Admitting: Physician Assistant

## 2016-07-31 ENCOUNTER — Encounter: Payer: Self-pay | Admitting: Physician Assistant

## 2016-07-31 VITALS — BP 112/76 | HR 88 | Temp 98.7°F | Resp 16 | Wt 183.0 lb

## 2016-07-31 DIAGNOSIS — J069 Acute upper respiratory infection, unspecified: Secondary | ICD-10-CM | POA: Diagnosis not present

## 2016-07-31 DIAGNOSIS — B9789 Other viral agents as the cause of diseases classified elsewhere: Secondary | ICD-10-CM | POA: Diagnosis not present

## 2016-07-31 MED ORDER — BENZONATATE 100 MG PO CAPS: 200.0000 mg | ORAL_CAPSULE | Freq: Three times a day (TID) | ORAL | 0 refills | Status: AC | PRN

## 2016-07-31 NOTE — Patient Instructions (Signed)
Upper Respiratory Infection, Adult Most upper respiratory infections (URIs) are caused by a virus. A URI affects the nose, throat, and upper air passages. The most common type of URI is often called "the common cold." Follow these instructions at home:  Take medicines only as told by your doctor.  Gargle warm saltwater or take cough drops to comfort your throat as told by your doctor.  Use a warm mist humidifier or inhale steam from a shower to increase air moisture. This may make it easier to breathe.  Drink enough fluid to keep your pee (urine) clear or pale yellow.  Eat soups and other clear broths.  Have a healthy diet.  Rest as needed.  Go back to work when your fever is gone or your doctor says it is okay.  You may need to stay home longer to avoid giving your URI to others.  You can also wear a face mask and wash your hands often to prevent spread of the virus.  Use your inhaler more if you have asthma.  Do not use any tobacco products, including cigarettes, chewing tobacco, or electronic cigarettes. If you need help quitting, ask your doctor. Contact a doctor if:  You are getting worse, not better.  Your symptoms are not helped by medicine.  You have chills.  You are getting more short of breath.  You have brown or red mucus.  You have yellow or brown discharge from your nose.  You have pain in your face, especially when you bend forward.  You have a fever.  You have puffy (swollen) neck glands.  You have pain while swallowing.  You have white areas in the back of your throat. Get help right away if:  You have very bad or constant:  Headache.  Ear pain.  Pain in your forehead, behind your eyes, and over your cheekbones (sinus pain).  Chest pain.  You have long-lasting (chronic) lung disease and any of the following:  Wheezing.  Long-lasting cough.  Coughing up blood.  A change in your usual mucus.  You have a stiff neck.  You have  changes in your:  Vision.  Hearing.  Thinking.  Mood. This information is not intended to replace advice given to you by your health care provider. Make sure you discuss any questions you have with your health care provider. Document Released: 11/21/2007 Document Revised: 02/05/2016 Document Reviewed: 09/09/2013 Elsevier Interactive Patient Education  2017 Elsevier Inc.  

## 2016-07-31 NOTE — Progress Notes (Signed)
[facility]  Chief Complaint  Patient presents with  . URI    Started about two days ago.     Subjective:    Patient ID: Tanya Meza, female    DOB: 1981-04-20, 36 y.o.   MRN: [id number]  Upper Respiratory Infection: Tanya Meza is a 36 y.o. female complaining of symptoms of a URI. Symptoms include congestion, cough and fever. Onset of symptoms was 2 days ago, gradually worsening since that time. She also c/o achiness, low grade fever and nasal congestion for the past 2 days .  She is drinking moderate amounts of fluids. Evaluation to date: none. Treatment to date: cough suppressants. The treatment has provided minimal relief.   Review of Systems  Constitutional: Positive for fatigue and fever. Negative for activity change, appetite change, chills, diaphoresis and unexpected weight change.  HENT: Positive for congestion, postnasal drip, sinus pressure, sore throat and voice change. Negative for ear discharge, ear pain, nosebleeds, rhinorrhea, sinus pain, sneezing, tinnitus and trouble swallowing.   Eyes: Negative.   Respiratory: Positive for cough. Negative for apnea, choking, chest tightness, shortness of breath, wheezing and stridor.   Gastrointestinal: Negative.   Neurological: Positive for headaches. Negative for dizziness and light-headedness.       Objective:   BP 112/76 (BP Location: Left Arm, Patient Position: Sitting, Cuff Size: Normal)   Pulse 88   Temp 98.7 F (37.1 C) (Oral)   Resp 16   Wt 183 lb (83 kg)   LMP 07/17/2016   BMI 31.41 kg/m   Patient Active Problem List   Diagnosis Date Noted  . PCOS (polycystic ovarian syndrome) 11/22/2015  . Calculus of kidney 12/30/2014  . Fast heart beat 12/30/2014  . Acute stress disorder 12/30/2014  . Overweight(278.02) 08/05/2012    Outpatient Encounter Prescriptions as of 07/31/2016  Medication Sig  . ranitidine (ZANTAC) 150 MG tablet Take 150 mg by mouth as needed for heartburn. Reported on 11/18/2015   . benzonatate (TESSALON PERLES) 100 MG capsule Take 2 capsules (200 mg total) by mouth 3 (three) times daily as needed for cough.  Marland Kitchen ibuprofen (ADVIL,MOTRIN) 600 MG tablet Take 1 tablet (600 mg total) by mouth every 6 (six) hours as needed for pain.  . [DISCONTINUED] furosemide (LASIX) 20 MG tablet Take 1 tablet (20 mg total) by mouth daily as needed. For lower extremity edema as needed   No facility-administered encounter medications on file as of 07/31/2016.     Allergies  Allergen Reactions  . Latex Swelling    Swelling- around catheter       Physical Exam  Constitutional: She appears well-developed and well-nourished.  HENT:  Right Ear: External ear normal.  Left Ear: External ear normal.  Mouth/Throat: Oropharynx is clear and moist. No oropharyngeal exudate.  Eyes: Right eye exhibits discharge. Left eye exhibits discharge.  Neck: Neck supple.  Cardiovascular: Normal rate and regular rhythm.   Pulmonary/Chest: Effort normal and breath sounds normal. No respiratory distress. She has no wheezes. She has no rales.  Lymphadenopathy:    She has no cervical adenopathy.  Skin: Skin is warm and dry.  Psychiatric: She has a normal mood and affect. Her behavior is normal.       Assessment & Plan:   1. Viral URI with cough  Symptomatic treatment.   - benzonatate (TESSALON PERLES) 100 MG capsule; Take 2 capsules (200 mg total) by mouth 3 (three) times daily as needed for cough.  Dispense: 21 capsule; Refill: 0  Recommend rest,  fluids, frequent hand washing. Work note provided.  Return if symptoms worsen or fail to improve.  Patient Instructions  Upper Respiratory Infection, Adult Most upper respiratory infections (URIs) are caused by a virus. A URI affects the nose, throat, and upper air passages. The most common type of URI is often called "the common cold." Follow these instructions at home:  Take medicines only as told by your doctor.  Gargle warm saltwater or take  cough drops to comfort your throat as told by your doctor.  Use a warm mist humidifier or inhale steam from a shower to increase air moisture. This may make it easier to breathe.  Drink enough fluid to keep your pee (urine) clear or pale yellow.  Eat soups and other clear broths.  Have a healthy diet.  Rest as needed.  Go back to work when your fever is gone or your doctor says it is okay.  You may need to stay home longer to avoid giving your URI to others.  You can also wear a face mask and wash your hands often to prevent spread of the virus.  Use your inhaler more if you have asthma.  Do not use any tobacco products, including cigarettes, chewing tobacco, or electronic cigarettes. If you need help quitting, ask your doctor. Contact a doctor if:  You are getting worse, not better.  Your symptoms are not helped by medicine.  You have chills.  You are getting more short of breath.  You have brown or red mucus.  You have yellow or brown discharge from your nose.  You have pain in your face, especially when you bend forward.  You have a fever.  You have puffy (swollen) neck glands.  You have pain while swallowing.  You have white areas in the back of your throat. Get help right away if:  You have very bad or constant:  Headache.  Ear pain.  Pain in your forehead, behind your eyes, and over your cheekbones (sinus pain).  Chest pain.  You have long-lasting (chronic) lung disease and any of the following:  Wheezing.  Long-lasting cough.  Coughing up blood.  A change in your usual mucus.  You have a stiff neck.  You have changes in your:  Vision.  Hearing.  Thinking.  Mood. This information is not intended to replace advice given to you by your health care provider. Make sure you discuss any questions you have with your health care provider. Document Released: 11/21/2007 Document Revised: 02/05/2016 Document Reviewed: 09/09/2013 Elsevier  Interactive Patient Education  2017 [employer]     The entirety of the information documented in the History of Present Illness, Review of Systems and Physical Exam were personally obtained by me. Portions of this information were initially documented by Kavin LeechLaura Walsh, CMA and reviewed by me for thoroughness and accuracy.

## 2016-08-24 ENCOUNTER — Encounter: Payer: Self-pay | Admitting: Physician Assistant

## 2016-08-24 ENCOUNTER — Ambulatory Visit (INDEPENDENT_AMBULATORY_CARE_PROVIDER_SITE_OTHER): Payer: 59 | Admitting: Physician Assistant

## 2016-08-24 VITALS — BP 110/82 | HR 74 | Temp 98.5°F | Resp 16 | Ht 64.0 in | Wt 181.6 lb

## 2016-08-24 DIAGNOSIS — Z0000 Encounter for general adult medical examination without abnormal findings: Secondary | ICD-10-CM | POA: Diagnosis not present

## 2016-08-24 DIAGNOSIS — F320 Major depressive disorder, single episode, mild: Secondary | ICD-10-CM

## 2016-08-24 DIAGNOSIS — E7800 Pure hypercholesterolemia, unspecified: Secondary | ICD-10-CM

## 2016-08-24 NOTE — Progress Notes (Signed)
Patient: Tanya Meza, Female    DOB: [birth year], 36 y.o.   MRN: [id number] Visit Date: 08/24/2016  Today's Provider: Margaretann LovelessJennifer M Otniel Hoe, PA-C   Chief Complaint  Patient presents with  . Annual Exam   Subjective:    Annual physical exam Tanya Meza is a 36 y.o. female who presents today for health maintenance and complete physical. She feels well. She reports exercising none. She reports she is sleeping well. 01/03/15 CPE 11/22/15 Pap-neg (GYN)  ----------------------------------------------------------------- Patient is requesting a referral to a therapist. Patient reports that in the past she has seen a couples therapist. She is having marital problems with her husband over financial issues. She states he has an addiction to opioids. He does go to a pain clinic and has also seen a neurosurgeon for back pain, but she states he always takes more than he is given monthly and will go buy more off the streets. This has caused a severe financial burden. She feels stuck because she does not know how to handle this. She has attended NA meetings to see if she could get some advice, but did not really get anywhere. She also has thought about going with him to his next pain clinic appointment to inform them of her concerns. She is scared to do this, however, because she is scared they will kick him out of the pain clinic then he would get more illegally. She is also scared to leave him because she is worried he will hurt himself.   Review of Systems  Constitutional: Negative.        Crying  HENT: Negative.   Eyes: Negative.   Respiratory: Negative.   Cardiovascular: Positive for leg swelling.  Gastrointestinal: Positive for abdominal distention and constipation.  Endocrine: Negative.   Genitourinary: Negative.   Musculoskeletal: Negative.   Skin: Negative.   Allergic/Immunologic: Negative.   Neurological: Negative.   Hematological: Negative.   Psychiatric/Behavioral:  Positive for decreased concentration, dysphoric mood and sleep disturbance. Negative for self-injury and suicidal ideas. The patient is nervous/anxious.     Social History      She  reports that she has never smoked. She has never used smokeless tobacco. She reports that she drinks alcohol. She reports that she does not use drugs.       Social History   Social History  . Marital status: Married    Spouse name: N/A  . Number of children: 3  . Years of education: N/A   Occupational History  .  [employer]   Social History Main Topics  . Smoking status: Never Smoker  . Smokeless tobacco: Never Used  . Alcohol use Yes     Comment: social  . Drug use: No  . Sexual activity: Yes    Partners: Male    Birth control/ protection: Surgical     Comment: tubaligation.   Other Topics Concern  . None   Social History Narrative  . None    Past Medical History:  Diagnosis Date  . Complication of anesthesia   . GERD (gastroesophageal reflux disease)    with preg, on zantac  . Nephrolithiasis    no surgery required  . PCOS (polycystic ovarian syndrome)   . Tachycardia    RVOF     Patient Active Problem List   Diagnosis Date Noted  . PCOS (polycystic ovarian syndrome) 11/22/2015  . Calculus of kidney 12/30/2014  . Fast heart beat 12/30/2014  . Acute stress disorder 12/30/2014  .  Overweight(278.02) 08/05/2012    Past Surgical History:  Procedure Laterality Date  . CESAREAN SECTION  2005, 2008   x3  . CESAREAN SECTION WITH BILATERAL TUBAL LIGATION    . WISDOM TOOTH EXTRACTION      Family History        Family Status  Relation Status  . Mother Alive  . Father Alive  . Brother Alive  . Maternal Grandfather Deceased  . Paternal Grandfather Deceased  . Brother Alive  . Brother Alive  . Daughter Alive  . Son Alive  . Daughter Alive  . Neg Hx         Her family history includes Alcohol abuse in her paternal grandfather; Diabetes in her father; Heart attack in her  maternal grandfather and paternal grandfather; Hyperlipidemia in her brother, father, and mother; Hypertension in her brother; Hypothyroidism in her mother; Thyroid disease in her mother.     Allergies  Allergen Reactions  . Latex Swelling    Swelling- around catheter     Current Outpatient Prescriptions:  .  ibuprofen (ADVIL,MOTRIN) 600 MG tablet, Take 1 tablet (600 mg total) by mouth every 6 (six) hours as needed for pain., Disp: 30 tablet, Rfl: 1 .  ranitidine (ZANTAC) 150 MG tablet, Take 150 mg by mouth as needed for heartburn. Reported on 11/18/2015, Disp: , Rfl:  .  Simethicone (ANTI GAS PO), Take 3 tablets by mouth as needed., Disp: , Rfl:    Patient Care Team: Margaretann Loveless, PA-C as PCP - General (Physician Assistant)      Objective:   Vitals: BP 110/82 (BP Location: Left Arm, Patient Position: Sitting, Cuff Size: Large)   Pulse 74   Temp 98.5 F (36.9 C) (Oral)   Resp 16   Ht 5\' 4"  (1.626 m)   Wt 181 lb 9.6 oz (82.4 kg)   LMP 08/20/2016 (Approximate)   SpO2 99%   BMI 31.17 kg/m    Vitals:   08/24/16 1015  BP: 110/82  Pulse: 74  Resp: 16  Temp: 98.5 F (36.9 C)  TempSrc: Oral  SpO2: 99%  Weight: 181 lb 9.6 oz (82.4 kg)  Height: 5\' 4"  (1.626 m)     Physical Exam  Constitutional: She is oriented to person, place, and time. She appears well-developed and well-nourished. No distress.  HENT:  Head: Normocephalic and atraumatic.  Right Ear: Hearing, tympanic membrane, external ear and ear canal normal.  Left Ear: Hearing, tympanic membrane, external ear and ear canal normal.  Nose: Nose normal.  Mouth/Throat: Uvula is midline, oropharynx is clear and moist and mucous membranes are normal. No oropharyngeal exudate.  Eyes: Conjunctivae and EOM are normal. Pupils are equal, round, and reactive to light. Right eye exhibits no discharge. Left eye exhibits no discharge. No scleral icterus.  Neck: Normal range of motion. Neck supple. No JVD present. No tracheal  deviation present. No thyromegaly present.  Cardiovascular: Normal rate, regular rhythm, normal heart sounds and intact distal pulses.  Exam reveals no gallop and no friction rub.   No murmur heard. Pulmonary/Chest: Effort normal and breath sounds normal. No respiratory distress. She has no wheezes. She has no rales. She exhibits no tenderness.  Abdominal: Soft. Bowel sounds are normal. She exhibits no distension and no mass. There is no tenderness. There is no rebound and no guarding.  Musculoskeletal: Normal range of motion. She exhibits no edema or tenderness.  Lymphadenopathy:    She has no cervical adenopathy.  Neurological: She is alert and oriented to  person, place, and time.  Skin: Skin is warm and dry. No rash noted. She is not diaphoretic.  Psychiatric: Her behavior is normal. Judgment and thought content normal. Her mood appears anxious. She exhibits a depressed mood.  Vitals reviewed.    Depression Screen PHQ 2/9 Scores 08/24/2016  PHQ - 2 Score 2  PHQ- 9 Score 5   GAD 7 : Generalized Anxiety Score 08/24/2016  Nervous, Anxious, on Edge 1  Control/stop worrying 0  Worry too much - different things 1  Trouble relaxing 0  Restless 0  Easily annoyed or irritable 0  Afraid - awful might happen 0  Total GAD 7 Score 2  Anxiety Difficulty Not difficult at all    Assessment & Plan:     Routine Health Maintenance and Physical Exam  Exercise Activities and Dietary recommendations Goals    . Exercise 150 minutes per week (moderate activity)       Immunization History  Administered Date(s) Administered  . Influenza Split 04/24/2011, 08/05/2012  . PPD Test 07/23/2011  . Tdap 05/16/2011    Health Maintenance  Topic Date Due  . INFLUENZA VACCINE  03/26/2017 (Originally 01/17/2016)  . PAP SMEAR  11/22/2018  . TETANUS/TDAP  05/15/2021  . HIV Screening  Completed     Discussed health benefits of physical activity, and encouraged her to engage in regular exercise  appropriate for her age and condition.    1. Annual physical exam Normal physical exam today. Will check labs as below and f/u pending lab results. If labs are stable and WNL she will not need to have these rechecked for one year at her next annual physical exam. She is to call the office in the meantime if she has any acute issue, questions or concerns. - CBC w/Diff/Platelet - Comprehensive Metabolic Panel (CMET) - TSH  2. Depression, major, single episode, mild (HCC) Worsening. Will refer to counseling as below. Discussed options on addressing the issue with her husband with a counselor to have a third party present to help diffuse any issues that may arise with the confrontation.  - Ambulatory referral to Psychology  3. Hypercholesterolemia Will check labs as below and f/u pending results. - Lipid Profile  --------------------------------------------------------------------    Margaretann Loveless, PA-C  [facility]

## 2016-08-24 NOTE — Patient Instructions (Signed)
Finding Treatment for Addiction What is addiction? Addiction is a complex disease of the brain. It causes an uncontrollable (compulsive) need for a substance. You can be addicted to alcohol, illegal drugs, or prescription medicines such as painkillers. Addiction can also be a behavior, like gambling or shopping. The need for the drug or activity can become so strong that you think about it all the time. You can also become physically dependent on a substance. Addiction can change the way your brain works. Because of these changes, getting more of whatever you are addicted to becomes the most important thing to you and feels better than other activities or relationships. Addiction can lead to changes in health, behavior, emotions, relationships, and choices that affect you and everyone around you. How do I know if I need treatment for addiction? Addiction is a progressive disease. Without treatment, addiction can get worse. Living with addiction puts you at higher risk for injury, poor health, lost employment, loss of money, and even death. You might need treatment for addiction if:  You have tried to stop or cut down, but you cannot.  Your addiction is causing physical health problems.  You find it annoying that your friends and family are concerned about your alcohol or substance use.  You feel guilty about substance abuse or a compulsive behavior.  You have lied or tried to hide your addiction.  You need a particular substance or activity to start your day or to calm down.  You are getting in trouble at school, work, home, or with the police.  You have done something illegal to support your addiction.  You are running out of money because of your addiction.  You have no time for anything other than your addiction. What types of treatment are available? The treatment program that is right for you will depend on many factors, including the type of addiction you have. Treatment programs  can be outpatient or inpatient. In an outpatient program, you live at home and go to work or school, but you also go to a clinic for treatment. With an inpatient program, you live and sleep at the program facility during treatment. After treatment, you might need a plan for support during recovery. Other treatment options include:  Medicine.  Some addictions may be treated with prescription medicines.  You might also need medicine to treat anxiety or depression.  Counseling and behavior therapy. Therapy can help individuals and families behave in healthier ways and relate more effectively.  Support groups. Confidential group therapy, such as a 12-step program, can help individuals and families during treatment and recovery. No single type of program is right for everyone. Many treatment programs involve a combination of education, counseling, and a 12-step, spiritually-based approach. Some treatment programs are government sponsored. They are geared for patients who do not have private insurance. Treatment programs can vary in many respects, such as:  Cost and types of insurance that are accepted.  Types of on-site medical services that are offered.  Length of stay, setting, and size.  Overall philosophy of treatment. What should I consider when selecting a treatment program? It is important to think about your individual requirements when selecting a treatment program. There are a number of things to consider, such as:  If the program is certified by the appropriate government agency. Even private programs must be certified and employ certified professionals.  If the program is covered by your insurance. If finances are a concern, the first call you should make is  to your insurance company, if you have health insurance. Ask for a list of treatment programs that are in your network, and confirm any copayments and deductibles that you may have to pay.  If you do not have insurance, or if  you choose to attend a program that does not accept your insurance, discuss whether a payment plan can be set up.  If treatment is available in languages other than English, if needed.  If the program offers detoxification treatment, if needed.  If 12-step meetings are held at the center or if transport is available for patients to attend meetings at other locations.  If the program is professional, organized, and clean.  If the program meets all of your needs, including physical and cultural needs.  If the facility offers specific treatment for your particular addiction.  If support continues to be offered after you have left the program.  If your treatment plan is continually looked at to make sure you are receiving the right treatment at the right time.  If mental health counseling is part of your treatment.  If medicine is included in treatment, if needed.  If your family is included in your treatment plan and if support is offered to them throughout the treatment process.  How the treatment works to prevent relapse. Where else can I get help?  Your health care provider. Ask him or her to help you find addiction treatment. These discussions are confidential.  The [employer] on Alcoholism and Drug Dependence (NCADD). This group has information about treatment centers and programs for people who have an addiction and for family members.  The telephone number is 1-800-NCA-CALL (854-468-1841).  The website is https://ncadd.org/about-ncadd/our-affiliates  The [facility]). This group will help you find publicly funded treatment centers, help hotlines, and counseling services near you.  The telephone number is 1-800-662-HELP 504-510-9733).  The website is www.findtreatment.[employer].com.cy In countries outside of the [place] and [place], look in [employer] for contact information for services in your area. This  information is not intended to replace advice given to you by your health care provider. Make sure you discuss any questions you have with your health care provider. Document Released: 05/03/2005 Document Revised: 04/30/2016 Document Reviewed: 03/23/2014 Elsevier Interactive Patient Education  2017 [place] Maintenance, Female Adopting a healthy lifestyle and getting preventive care can go a long way to promote health and wellness. Talk with your health care provider about what schedule of regular examinations is right for you. This is a good chance for you to check in with your provider about disease prevention and staying healthy. In between checkups, there are plenty of things you can do on your own. Experts have done a lot of research about which lifestyle changes and preventive measures are most likely to keep you healthy. Ask your health care provider for more information. Weight and diet Eat a healthy diet  Be sure to include plenty of vegetables, fruits, low-fat dairy products, and lean protein.  Do not eat a lot of foods high in solid fats, added sugars, or salt.  Get regular exercise. This is one of the most important things you can do for your health.  Most adults should exercise for at least 150 minutes each week. The exercise should increase your heart rate and make you sweat (moderate-intensity exercise).  Most adults should also do strengthening exercises at least twice a week. This is in addition to the moderate-intensity exercise. Maintain a healthy  weight  Body mass index (BMI) is a measurement that can be used to identify possible weight problems. It estimates body fat based on height and weight. Your health care provider can help determine your BMI and help you achieve or maintain a healthy weight.  For females 31 years of age and older:  A BMI below 18.5 is considered underweight.  A BMI of 18.5 to 24.9 is normal.  A BMI of 25 to 29.9 is considered  overweight.  A BMI of 30 and above is considered obese. Watch levels of cholesterol and blood lipids  You should start having your blood tested for lipids and cholesterol at 38 years of age, then have this test every 5 years.  You may need to have your cholesterol levels checked more often if:  Your lipid or cholesterol levels are high.  You are older than 38 years of age.  You are at high risk for heart disease. Cancer screening Lung Cancer  Lung cancer screening is recommended for adults 27-51 years old who are at high risk for lung cancer because of a history of smoking.  A yearly low-dose CT scan of the lungs is recommended for people who:  Currently smoke.  Have quit within the past 15 years.  Have at least a 30-pack-year history of smoking. A pack year is smoking an average of one pack of cigarettes a day for 1 year.  Yearly screening should continue until it has been 15 years since you quit.  Yearly screening should stop if you develop a health problem that would prevent you from having lung cancer treatment. Breast Cancer  Practice breast self-awareness. This means understanding how your breasts normally appear and feel.  It also means doing regular breast self-exams. Let your health care provider know about any changes, no matter how small.  If you are in your 20s or 30s, you should have a clinical breast exam (CBE) by a health care provider every 1-3 years as part of a regular health exam.  If you are 64 or older, have a CBE every year. Also consider having a breast X-ray (mammogram) every year.  If you have a family history of breast cancer, talk to your health care provider about genetic screening.  If you are at high risk for breast cancer, talk to your health care provider about having an MRI and a mammogram every year.  Breast cancer gene (BRCA) assessment is recommended for women who have family members with BRCA-related cancers. BRCA-related cancers  include:  Breast.  Ovarian.  Tubal.  Peritoneal cancers.  Results of the assessment will determine the need for genetic counseling and BRCA1 and BRCA2 testing. Cervical Cancer  Your health care provider may recommend that you be screened regularly for cancer of the pelvic organs (ovaries, uterus, and vagina). This screening involves a pelvic examination, including checking for microscopic changes to the surface of your cervix (Pap test). You may be encouraged to have this screening done every 3 years, beginning at age 10.  For women ages 42-65, health care providers may recommend pelvic exams and Pap testing every 3 years, or they may recommend the Pap and pelvic exam, combined with testing for human papilloma virus (HPV), every 5 years. Some types of HPV increase your risk of cervical cancer. Testing for HPV may also be done on women of any age with unclear Pap test results.  Other health care providers may not recommend any screening for nonpregnant women who are considered low risk for  pelvic cancer and who do not have symptoms. Ask your health care provider if a screening pelvic exam is right for you.  If you have had past treatment for cervical cancer or a condition that could lead to cancer, you need Pap tests and screening for cancer for at least 20 years after your treatment. If Pap tests have been discontinued, your risk factors (such as having a new sexual partner) need to be reassessed to determine if screening should resume. Some women have medical problems that increase the chance of getting cervical cancer. In these cases, your health care provider may recommend more frequent screening and Pap tests. Colorectal Cancer  This type of cancer can be detected and often prevented.  Routine colorectal cancer screening usually begins at 38 years of age and continues through 38 years of age.  Your health care provider may recommend screening at an earlier age if you have risk factors  for colon cancer.  Your health care provider may also recommend using home test kits to check for hidden blood in the stool.  A small camera at the end of a tube can be used to examine your colon directly (sigmoidoscopy or colonoscopy). This is done to check for the earliest forms of colorectal cancer.  Routine screening usually begins at age 86.  Direct examination of the colon should be repeated every 5-10 years through 38 years of age. However, you may need to be screened more often if early forms of precancerous polyps or small growths are found. Skin Cancer  Check your skin from head to toe regularly.  Tell your health care provider about any new moles or changes in moles, especially if there is a change in a mole's shape or color.  Also tell your health care provider if you have a mole that is larger than the size of a pencil eraser.  Always use sunscreen. Apply sunscreen liberally and repeatedly throughout the day.  Protect yourself by wearing long sleeves, pants, a wide-brimmed hat, and sunglasses whenever you are outside. Heart disease, diabetes, and high blood pressure  High blood pressure causes heart disease and increases the risk of stroke. High blood pressure is more likely to develop in:  People who have blood pressure in the high end of the normal range (130-139/85-89 mm Hg).  People who are overweight or obese.  People who are African American.  If you are 76-83 years of age, have your blood pressure checked every 3-5 years. If you are 77 years of age or older, have your blood pressure checked every year. You should have your blood pressure measured twice-once when you are at a hospital or clinic, and once when you are not at a hospital or clinic. Record the average of the two measurements. To check your blood pressure when you are not at a hospital or clinic, you can use:  An automated blood pressure machine at a pharmacy.  A home blood pressure monitor.  If you  are between 76 years and 12 years old, ask your health care provider if you should take aspirin to prevent strokes.  Have regular diabetes screenings. This involves taking a blood sample to check your fasting blood sugar level.  If you are at a normal weight and have a low risk for diabetes, have this test once every three years after 38 years of age.  If you are overweight and have a high risk for diabetes, consider being tested at a younger age or more often. Preventing infection  Hepatitis B  If you have a higher risk for hepatitis B, you should be screened for this virus. You are considered at high risk for hepatitis B if:  You were born in a country where hepatitis B is common. Ask your health care provider which countries are considered high risk.  Your parents were born in a high-risk country, and you have not been immunized against hepatitis B (hepatitis B vaccine).  You have HIV or AIDS.  You use needles to inject street drugs.  You live with someone who has hepatitis B.  You have had sex with someone who has hepatitis B.  You get hemodialysis treatment.  You take certain medicines for conditions, including cancer, organ transplantation, and autoimmune conditions. Hepatitis C  Blood testing is recommended for:  Everyone born from [birth year] through 1967.  Anyone with known risk factors for hepatitis C. Sexually transmitted infections (STIs)  You should be screened for sexually transmitted infections (STIs) including gonorrhea and chlamydia if:  You are sexually active and are younger than 38 years of age.  You are older than 38 years of age and your health care provider tells you that you are at risk for this type of infection.  Your sexual activity has changed since you were last screened and you are at an increased risk for chlamydia or gonorrhea. Ask your health care provider if you are at risk.  If you do not have HIV, but are at risk, it may be recommended that you  take a prescription medicine daily to prevent HIV infection. This is called pre-exposure prophylaxis (PrEP). You are considered at risk if:  You are sexually active and do not regularly use condoms or know the HIV status of your partner(s).  You take drugs by injection.  You are sexually active with a partner who has HIV. Talk with your health care provider about whether you are at high risk of being infected with HIV. If you choose to begin PrEP, you should first be tested for HIV. You should then be tested every 3 months for as long as you are taking PrEP. Pregnancy  If you are premenopausal and you may become pregnant, ask your health care provider about preconception counseling.  If you may become pregnant, take 400 to 800 micrograms (mcg) of folic acid every day.  If you want to prevent pregnancy, talk to your health care provider about birth control (contraception). Osteoporosis and menopause  Osteoporosis is a disease in which the bones lose minerals and strength with aging. This can result in serious bone fractures. Your risk for osteoporosis can be identified using a bone density scan.  If you are 29 years of age or older, or if you are at risk for osteoporosis and fractures, ask your health care provider if you should be screened.  Ask your health care provider whether you should take a calcium or vitamin D supplement to lower your risk for osteoporosis.  Menopause may have certain physical symptoms and risks.  Hormone replacement therapy may reduce some of these symptoms and risks. Talk to your health care provider about whether hormone replacement therapy is right for you. Follow these instructions at home:  Schedule regular health, dental, and eye exams.  Stay current with your immunizations.  Do not use any tobacco products including cigarettes, chewing tobacco, or electronic cigarettes.  If you are pregnant, do not drink alcohol.  If you are breastfeeding, limit  how much and how often you drink alcohol.  Limit alcohol  intake to no more than 1 drink per day for nonpregnant women. One drink equals 12 ounces of beer, 5 ounces of wine, or 1 ounces of hard liquor.  Do not use street drugs.  Do not share needles.  Ask your health care provider for help if you need support or information about quitting drugs.  Tell your health care provider if you often feel depressed.  Tell your health care provider if you have ever been abused or do not feel safe at home. This information is not intended to replace advice given to you by your health care provider. Make sure you discuss any questions you have with your health care provider. Document Released: 12/18/2010 Document Revised: 11/10/2015 Document Reviewed: 03/08/2015 Elsevier Interactive Patient Education  2017 [employer].

## 2016-08-31 ENCOUNTER — Ambulatory Visit: Payer: Self-pay | Admitting: Obstetrics & Gynecology

## 2016-09-01 LAB — COMPREHENSIVE METABOLIC PANEL
A/G RATIO: 1.7 (ref 1.2–2.2)
ALBUMIN: 4.3 g/dL (ref 3.5–5.5)
ALK PHOS: 55 IU/L (ref 39–117)
ALT: 18 IU/L (ref 0–32)
AST: 19 IU/L (ref 0–40)
BUN / CREAT RATIO: 17 (ref 9–23)
BUN: 14 mg/dL (ref 6–20)
Bilirubin Total: 0.2 mg/dL (ref 0.0–1.2)
CHLORIDE: 101 mmol/L (ref 96–106)
CO2: 25 mmol/L (ref 18–29)
Calcium: 9 mg/dL (ref 8.7–10.2)
Creatinine, Ser: 0.81 mg/dL (ref 0.57–1.00)
GFR calc non Af Amer: 93 mL/min/{1.73_m2} (ref >59)
GFR, EST AFRICAN AMERICAN: 107 mL/min/{1.73_m2} (ref >59)
GLOBULIN, TOTAL: 2.6 g/dL (ref 1.5–4.5)
Glucose: 92 mg/dL (ref 65–99)
Potassium: 4.4 mmol/L (ref 3.5–5.2)
SODIUM: 139 mmol/L (ref 134–144)
Total Protein: 6.9 g/dL (ref 6.0–8.5)

## 2016-09-01 LAB — CBC WITH DIFFERENTIAL/PLATELET
BASOS ABS: 0 10*3/uL (ref 0.0–0.2)
BASOS: 1 %
EOS (ABSOLUTE): 0.2 10*3/uL (ref 0.0–0.4)
Eos: 4 %
HEMOGLOBIN: 12.5 g/dL (ref 11.1–15.9)
Hematocrit: 39.9 % (ref 34.0–46.6)
Immature Grans (Abs): 0 10*3/uL (ref 0.0–0.1)
Immature Granulocytes: 0 %
LYMPHS ABS: 1.7 10*3/uL (ref 0.7–3.1)
Lymphs: 35 %
MCH: 27.3 pg (ref 26.6–33.0)
MCHC: 31.3 g/dL — AB (ref 31.5–35.7)
MCV: 87 fL (ref 79–97)
MONOCYTES: 6 %
Monocytes Absolute: 0.3 10*3/uL (ref 0.1–0.9)
NEUTROS ABS: 2.6 10*3/uL (ref 1.4–7.0)
Neutrophils: 54 %
Platelets: 311 10*3/uL (ref 150–379)
RBC: 4.58 x10E6/uL (ref 3.77–5.28)
RDW: 14.2 % (ref 12.3–15.4)
WBC: 4.9 10*3/uL (ref 3.4–10.8)

## 2016-09-01 LAB — TSH: TSH: 1.95 u[IU]/mL (ref 0.450–4.500)

## 2016-09-01 LAB — LIPID PANEL
Chol/HDL Ratio: 3.7 ratio units (ref 0.0–4.4)
Cholesterol, Total: 197 mg/dL (ref 100–199)
HDL: 53 mg/dL (ref >39)
LDL Calculated: 127 mg/dL — ABNORMAL HIGH (ref 0–99)
Triglycerides: 86 mg/dL (ref 0–149)
VLDL CHOLESTEROL CAL: 17 mg/dL (ref 5–40)

## 2016-09-03 ENCOUNTER — Telehealth: Payer: Self-pay

## 2016-09-03 NOTE — Telephone Encounter (Signed)
Patient advised as directed below.  Thanks,  -Joseline 

## 2016-09-03 NOTE — Telephone Encounter (Signed)
-----   Message from Margaretann LovelessJennifer M Burnette, PA-C sent at 09/03/2016  8:29 AM EDT ----- All labs are within normal limits and stable.  Thanks! -JB

## 2016-09-17 ENCOUNTER — Ambulatory Visit (INDEPENDENT_AMBULATORY_CARE_PROVIDER_SITE_OTHER): Payer: 59 | Admitting: Licensed Clinical Social Worker

## 2016-09-17 DIAGNOSIS — F4323 Adjustment disorder with mixed anxiety and depressed mood: Secondary | ICD-10-CM | POA: Diagnosis not present

## 2016-09-17 NOTE — Progress Notes (Signed)
Comprehensive Clinical Assessment (CCA) Note  09/17/2016 Tanya Meza [id number]  Visit Diagnosis:      ICD-9-CM ICD-10-CM   1. Adjustment disorder with mixed anxiety and depressed mood 309.28 F43.23       CCA Part One  Part One has been completed on paper by the patient.  (See scanned document in Chart Review)  CCA Part Two A  Intake/Chief Complaint:  CCA Intake With Chief Complaint CCA Part Two Date: 09/17/16 CCA Part Two Time: 1400 Chief Complaint/Presenting Problem: My husband has an addiction. Patients Currently Reported Symptoms/Problems: My husband is spending a lot of money.I left the home on March 25.  He is on prescription pain meds.  He has been on meds for 5 years.  I have crying spells, on edge, difficulty sleeping, drained, exhausted Individual's Strengths: being strong in front of my children Individual's Preferences: my current situation, financial choices Individual's Abilities: communicates well Type of Services Patient Feels Are Needed: therapy  Mental Health Symptoms Depression:  Depression: Change in energy/activity, Difficulty Concentrating, Fatigue, Tearfulness, Sleep (too much or little), Hopelessness  Mania:  Mania: N/A  Anxiety:   Anxiety: Worrying  Psychosis:  Psychosis: N/A  Trauma:  Trauma: N/A  Obsessions:  Obsessions: N/A  Compulsions:  Compulsions: N/A  Inattention:  Inattention: N/A  Hyperactivity/Impulsivity:  Hyperactivity/Impulsivity: N/A  Oppositional/Defiant Behaviors:  Oppositional/Defiant Behaviors: N/A  Borderline Personality:  Emotional Irregularity: N/A  Other Mood/Personality Symptoms:      Mental Status Exam Appearance and self-care  Stature:  Stature: Average  Weight:  Weight: Average weight  Clothing:  Clothing: Neat/clean  Grooming:  Grooming: Normal  Cosmetic use:  Cosmetic Use: Age appropriate  Posture/gait:  Posture/Gait: Normal  Motor activity:  Motor Activity: Not Remarkable  Sensorium  Attention:  Attention:  Normal  Concentration:  Concentration: Normal  Orientation:  Orientation: X5  Recall/memory:  Recall/Memory: Normal  Affect and Mood  Affect:  Affect: Appropriate  Mood:  Mood: Depressed  Relating  Eye contact:  Eye Contact: Normal  Facial expression:  Facial Expression: Responsive  Attitude toward examiner:  Attitude Toward Examiner: Cooperative  Thought and Language  Speech flow:    Thought content:     Preoccupation:     Hallucinations:     Organization:     Company secretary of Knowledge:     Intelligence:  Intelligence: Average  Abstraction:  Abstraction: Normal  Judgement:  Judgement: Normal  Reality Testing:  Reality Testing: Adequate  Insight:  Insight: Good  Decision Making:  Decision Making: Normal  Social Functioning  Social Maturity:  Social Maturity: Responsible  Social Judgement:  Social Judgement: Normal  Stress  Stressors:  Stressors: Family conflict  Coping Ability:  Coping Ability: Building surveyor Deficits:     Supports:      Family and Psychosocial History: Family history Marital status: Married Number of Years Married: 17 What types of issues is patient dealing with in the relationship?: he is abusing prescription pills Additional relationship information: On March 25 she left the home with the children Are you sexually active?: No What is your sexual orientation?: heterosexual Does patient have children?: Yes How many children?: 3 How is patient's relationship with their children?: she reports a good relationship with children  Childhood History:  Childhood History By whom was/is the patient raised?: Both parents Additional childhood history information: describes childhood as pretty good. my parents strived to provide for [place].   Description of patient's relationship with caregiver when they were a child: Mother:  pretty good Father: pretty good.  He was a hot tempered person. He was affectionate. Patient's description of current  relationship with people who raised him/her: Mother: good Father: good How were you disciplined when you got in trouble as a child/adolescent?: yells, spanking once Does patient have siblings?: Yes Number of Siblings: 3 Description of patient's current relationship with siblings: we have good relationships Did patient suffer any verbal/emotional/physical/sexual abuse as a child?: No Did patient suffer from severe childhood neglect?: No Has patient ever been sexually abused/assaulted/raped as an adolescent or adult?: No Was the patient ever a victim of a crime or a disaster?: No Witnessed domestic violence?: No Has patient been effected by domestic violence as an adult?: No  CCA Part Two B  Employment/Work Situation: Employment / Work Psychologist, occupational Employment situation: Employed Where is patient currently employed?: [employer] How long has patient been employed?: 1 year Patient's job has been impacted by current illness: No What is the longest time patient has a held a job?: 40yrs 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 [place] Has patient ever been in the [employer]?: No  Education: Education Last Grade Completed: 12 Name of High School: [employer] in [place] Did [place] Graduate From [employer]?: Yes Did Theme park manager?: Yes What Type of College Degree Do you Have?: Associates in Dental Assisting Did You Attend Graduate School?: No What Was Your Major?: Dental Assisting Did You Have Any Special Interests In School?: denies Did You Have An Individualized Education Program (IIEP): No Did You Have Any Difficulty At School?: No  Religion: Religion/Spirituality Are You A Religious Person?: Yes What is Your Religious Affiliation?: Christian How Might This Affect Treatment?: denies  Leisure/Recreation: Leisure / Recreation Leisure and Hobbies: travel, camping, animals  Exercise/Diet: Exercise/Diet Do You Exercise?: No Have  You Gained or Lost A Significant Amount of Weight in the Past Six Months?: No Do You Follow a Special Diet?: No Do You Have Any Trouble Sleeping?: Yes Explanation of Sleeping Difficulties: difficulty falling asleep  CCA Part Two C  Alcohol/Drug Use: Alcohol / Drug Use Pain Medications: none Prescriptions: none Over the Counter: Zantac, Gas X, Advil all as needed History of alcohol / drug use?: No history of alcohol / drug abuse                      CCA Part Three  ASAM's:  Six Dimensions of Multidimensional Assessment  Dimension 1:  Acute Intoxication and/or Withdrawal Potential:     Dimension 2:  Biomedical Conditions and Complications:     Dimension 3:  Emotional, Behavioral, or Cognitive Conditions and Complications:     Dimension 4:  Readiness to Change:     Dimension 5:  Relapse, Continued use, or Continued Problem Potential:     Dimension 6:  Recovery/Living Environment:      Substance use Disorder (SUD)    Social Function:  Social Functioning Social Maturity: Responsible Social Judgement: Normal  Stress:  Stress Stressors: Family conflict Coping Ability: Overwhelmed Patient Takes Medications The Way The Doctor Instructed?: No Priority Risk: Low Acuity  Risk Assessment- Self-Harm Potential: Risk Assessment For Self-Harm Potential Thoughts of Self-Harm: No current thoughts Method: No plan Availability of Means: No access/NA  Risk Assessment -Dangerous to Others Potential: Risk Assessment For Dangerous to Others Potential Method: No Plan Availability of Means: No access or NA Intent: Vague intent or NA Notification Required: No need or identified person  DSM5 Diagnoses: Patient  Active Problem List   Diagnosis Date Noted  . PCOS (polycystic ovarian syndrome) 11/22/2015  . Calculus of kidney 12/30/2014  . Fast heart beat 12/30/2014  . Acute stress disorder 12/30/2014  . Overweight(278.02) 08/05/2012    Patient Centered Plan: Will complete at  the next session with patient   Recommendations for Services/Supports/Treatments: Recommendations for Services/Supports/Treatments Recommendations For Services/Supports/Treatments: Individual Therapy, Medication Management  Treatment Plan Summary:    Referrals to Alternative Service(s): Referred to Alternative Service(s):   Place:   Date:   Time:    Referred to Alternative Service(s):   Place:   Date:   Time:    Referred to Alternative Service(s):   Place:   Date:   Time:    Referred to Alternative Service(s):   Place:   Date:   Time:     Marinda Elk

## 2016-09-28 ENCOUNTER — Ambulatory Visit: Payer: 59 | Admitting: Licensed Clinical Social Worker

## 2016-10-09 ENCOUNTER — Encounter: Payer: Self-pay | Admitting: Family Medicine

## 2016-10-09 ENCOUNTER — Ambulatory Visit (INDEPENDENT_AMBULATORY_CARE_PROVIDER_SITE_OTHER): Payer: 59 | Admitting: Family Medicine

## 2016-10-09 VITALS — BP 99/64 | HR 69 | Ht 64.0 in | Wt 182.0 lb

## 2016-10-09 DIAGNOSIS — Z01419 Encounter for gynecological examination (general) (routine) without abnormal findings: Secondary | ICD-10-CM

## 2016-10-09 DIAGNOSIS — Z1151 Encounter for screening for human papillomavirus (HPV): Secondary | ICD-10-CM

## 2016-10-09 DIAGNOSIS — Z124 Encounter for screening for malignant neoplasm of cervix: Secondary | ICD-10-CM

## 2016-10-09 DIAGNOSIS — E282 Polycystic ovarian syndrome: Secondary | ICD-10-CM

## 2016-10-09 DIAGNOSIS — F430 Acute stress reaction: Secondary | ICD-10-CM

## 2016-10-09 DIAGNOSIS — E663 Overweight: Secondary | ICD-10-CM

## 2016-10-09 MED ORDER — PHENTERMINE HCL 37.5 MG PO CAPS: 37.5000 mg | ORAL_CAPSULE | ORAL | 2 refills | Status: DC

## 2016-10-09 NOTE — Progress Notes (Signed)
Last pap 11/2015-Normal and negative HPV-Desires Pap today-She has new insurance All other labs completed by PCP Pt c/o history with ovarian cyst. Last cyst was in January. No other complaints at this time.

## 2016-10-09 NOTE — Patient Instructions (Signed)
Menorrhagia Menorrhagia is when your menstrual periods are heavy or last longer than usual. Follow these instructions at home:  Only take medicine as told by your doctor.  Take any iron pills as told by your doctor. Heavy bleeding may cause low levels of iron in your body.  Do not take aspirin 1 week before or during your period. Aspirin can make the bleeding worse.  Lie down for a while if you change your tampon or pad more than once in 2 hours. This may help lessen the bleeding.  Eat a healthy diet and foods with iron. These foods include leafy green vegetables, meat, liver, eggs, and whole grain breads and cereals.  Do not try to lose weight. Wait until the heavy bleeding has stopped and your iron level is normal. Contact a doctor if:  You soak through a pad or tampon every 1 or 2 hours, and this happens every time you have a period.  You need to use pads and tampons at the same time because you are bleeding so much.  You need to change your pad or tampon during the night.  You have a period that lasts for more than 8 days.  You pass clots bigger than 1 inch (2.5 cm) wide.  You have irregular periods that happen more or less often than once a month.  You feel dizzy or pass out (faint).  You feel very weak or tired.  You feel short of breath or feel your heart is beating too fast when you exercise.  You feel sick to your stomach (nausea) and you throw up (vomit) while you are taking your medicine.  You have watery poop (diarrhea) while you are taking your medicine.  You have any problems that may be related to the medicine you are taking. Get help right away if:  You soak through 4 or more pads or tampons in 2 hours.  You have any bleeding while you are pregnant. This information is not intended to replace advice given to you by your health care provider. Make sure you discuss any questions you have with your health care provider. Document Released: 03/13/2008 Document  Revised: 11/10/2015 Document Reviewed: 12/04/2012 Elsevier Interactive Patient Education  2017 Elsevier Inc.  

## 2016-10-09 NOTE — Addendum Note (Signed)
Addended by: Reva Bores on: 10/09/2016 11:05 AM   Modules accepted: Level of Service

## 2016-10-09 NOTE — Progress Notes (Signed)
  Subjective:     Tanya Meza is a 36 y.o. female and is here for a comprehensive physical exam. The patient reports problems - heavy cycles which are irregular and last 7-10 days.. Having some marital problems. 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 Desires meds for her weight loss.  Social History   Social History  . Marital status: Married    Spouse name: N/A  . Number of children: 3  . Years of education: N/A   Occupational History  .  [employer]   Social History Main Topics  . Smoking status: Never Smoker  . Smokeless tobacco: Never Used  . Alcohol use Yes     Comment: social  . Drug use: No  . Sexual activity: Yes    Partners: Male    Birth control/ protection: Surgical     Comment: tubaligation.   Other Topics Concern  . Not on file   Social History Narrative  . No narrative on file   Health Maintenance  Topic Date Due  . INFLUENZA VACCINE  03/26/2017 (Originally 01/16/2017)  . PAP SMEAR  11/22/2018  . TETANUS/TDAP  05/15/2021  . HIV Screening  Completed    The following portions of the patient's history were reviewed and updated as appropriate: allergies, current medications, past family history, past medical history, past social history, past surgical history and problem list.  Review of Systems Pertinent items noted in HPI and remainder of comprehensive ROS otherwise negative.   Objective:    BP 99/64   Pulse 69   Ht  (1.626 m)   Wt 182 lb (82.6 kg)   LMP 08/28/2016   BMI 31.24 kg/m  General appearance: alert, cooperative and appears stated age Head: Normocephalic, without obvious abnormality, atraumatic Neck: no adenopathy, supple, symmetrical, trachea midline and thyroid not enlarged, symmetric, no tenderness/mass/nodules Lungs: clear to auscultation bilaterally Breasts: normal appearance, no masses or tenderness Heart: regular rate and rhythm, S1, S2 normal, no murmur, click, rub or gallop Abdomen:  soft, non-tender; bowel sounds normal; no masses,  no organomegaly Pelvic: cervix normal in appearance, external genitalia normal, no adnexal masses or tenderness, no cervical motion tenderness, uterus normal size, shape, and consistency and vagina normal without discharge Extremities: extremities normal, atraumatic, no cyanosis or edema Pulses: 2+ and symmetric Skin: Skin color, texture, turgor normal. No rashes or lesions Lymph nodes: Cervical, supraclavicular, and axillary nodes normal. Neurologic: Grossly normal    Assessment:    Healthy female exam.      Plan:      Problem List Items Addressed This Visit      Unprioritized   Overweight   Relevant Medications   phentermine 37.5 MG capsule   Acute stress disorder   PCOS (polycystic ovarian syndrome)    Other Visit Diagnoses    Encounter for gynecological examination without abnormal finding    -  Primary   Screening for malignant neoplasm of cervix       Relevant Orders   Cytology - PAP     Return in 1 year (on 10/09/2017).  See After Visit Summary for Counseling Recommendations

## 2016-10-11 LAB — CYTOLOGY - PAP
DIAGNOSIS: NEGATIVE
HPV: NOT DETECTED

## 2017-01-04 ENCOUNTER — Encounter: Payer: Self-pay | Admitting: Family Medicine

## 2017-01-04 ENCOUNTER — Ambulatory Visit (INDEPENDENT_AMBULATORY_CARE_PROVIDER_SITE_OTHER): Payer: 59 | Admitting: Family Medicine

## 2017-01-04 VITALS — BP 112/72 | HR 87 | Temp 97.7°F | Wt 162.4 lb

## 2017-01-04 DIAGNOSIS — R2243 Localized swelling, mass and lump, lower limb, bilateral: Secondary | ICD-10-CM

## 2017-01-04 NOTE — Progress Notes (Signed)
Patient: Tanya HoseDawn M Kosel Female    DOB: Apr 26, 1981   36 y.o.   MRN: [id number] Visit Date: 01/04/2017  Today's Provider: Dortha Kernennis Lorriane Dehart, PA   Chief Complaint  Patient presents with  . Leg Swelling   Subjective:    HPI Leg Swelling   The incident occurred more than 1 week ago. There was no injury mechanism. The pain is present in the left leg and right leg. The patient is experiencing no pain. The pain has been intermittent since onset. Associated symptoms comments: Swelling . She reports no foreign bodies present. Exacerbated by: standing all day. Swelling worse at the end of day.   Past Medical History:  Diagnosis Date  . Complication of anesthesia   . GERD (gastroesophageal reflux disease)    with preg, on zantac  . Nephrolithiasis    no surgery required  . PCOS (polycystic ovarian syndrome)   . Tachycardia    RVOF   Past Surgical History:  Procedure Laterality Date  . CESAREAN SECTION  2005, 2008   x3  . CESAREAN SECTION WITH BILATERAL TUBAL LIGATION    . WISDOM TOOTH EXTRACTION     Family History  Problem Relation Age of Onset  . Thyroid disease Mother   . Hyperlipidemia Mother   . Hypothyroidism Mother   . Diabetes Father   . Hyperlipidemia Father   . Hypertension Brother   . Heart attack Maternal Grandfather   . Alcohol abuse Paternal Grandfather   . Heart attack Paternal Grandfather   . Hyperlipidemia Brother   . Anesthesia problems Neg Hx    Allergies  Allergen Reactions  . Latex Swelling    Swelling- around catheter     Previous Medications   IBUPROFEN (ADVIL,MOTRIN) 600 MG TABLET    Take 1 tablet (600 mg total) by mouth every 6 (six) hours as needed for pain.   PHENTERMINE 37.5 MG CAPSULE    Take 1 capsule (37.5 mg total) by mouth every morning.   RANITIDINE (ZANTAC) 150 MG TABLET    Take 150 mg by mouth as needed for heartburn. Reported on 11/18/2015   SIMETHICONE (ANTI GAS PO)    Take 3 tablets by mouth as needed.    Review of Systems    Constitutional: Negative.   Respiratory: Negative.   Cardiovascular: Positive for leg swelling.    Social History  Substance Use Topics  . Smoking status: Never Smoker  . Smokeless tobacco: Never Used  . Alcohol use Yes     Comment: social   Objective:   BP 112/72 (BP Location: Right Arm, Patient Position: Sitting, Cuff Size: Normal)   Pulse 87   Temp 97.7 F (36.5 C) (Oral)   Wt 162 lb 6.4 oz (73.7 kg)   SpO2 96%   BMI 27.88 kg/m  Wt Readings from Last 3 Encounters:  01/04/17 162 lb 6.4 oz (73.7 kg)  10/09/16 182 lb (82.6 kg)  08/24/16 181 lb 9.6 oz (82.4 kg)    Physical Exam  Constitutional: She is oriented to person, place, and time. She appears well-developed and well-nourished. No distress.  HENT:  Head: Normocephalic and atraumatic.  Right Ear: Hearing normal.  Left Ear: Hearing normal.  Nose: Nose normal.  Eyes: Conjunctivae and lids are normal. Right eye exhibits no discharge. Left eye exhibits no discharge. No scleral icterus.  Neck: Neck supple.  Cardiovascular: Normal rate and regular rhythm.   Pulmonary/Chest: Effort normal and breath sounds normal. No respiratory distress.  Abdominal: Bowel sounds are normal.  Musculoskeletal:  Normal range of motion. She exhibits no edema or tenderness.  Neurological: She is alert and oriented to person, place, and time.  Skin: Skin is intact. No lesion and no rash noted.  Psychiatric: She has a normal mood and affect. Her speech is normal and behavior is normal. Thought content normal.      Assessment & Plan:     1. Localized swelling of both lower legs Noticed enlargement of lower legs over the past few months. No pain or pitting edema. Good pulses in both feet. Has lost 20 lbs in the past 3 months. Suspect size change has been due to weight loss and not edema. Last renal function and electrolytes were normal on 08-31-16. She does not want to get tests again. Just glad it is not fluid build up. Recheck prn.

## 2017-08-19 ENCOUNTER — Encounter: Payer: Self-pay | Admitting: Radiology

## 2017-10-02 ENCOUNTER — Ambulatory Visit (INDEPENDENT_AMBULATORY_CARE_PROVIDER_SITE_OTHER): Payer: 59 | Admitting: Family Medicine

## 2017-10-02 ENCOUNTER — Encounter: Payer: Self-pay | Admitting: Family Medicine

## 2017-10-02 VITALS — BP 126/73 | HR 72 | Wt 168.0 lb

## 2017-10-02 DIAGNOSIS — Z8742 Personal history of other diseases of the female genital tract: Secondary | ICD-10-CM

## 2017-10-02 DIAGNOSIS — R1031 Right lower quadrant pain: Secondary | ICD-10-CM | POA: Diagnosis not present

## 2017-10-02 DIAGNOSIS — Z113 Encounter for screening for infections with a predominantly sexual mode of transmission: Secondary | ICD-10-CM | POA: Diagnosis not present

## 2017-10-02 DIAGNOSIS — R102 Pelvic and perineal pain: Secondary | ICD-10-CM

## 2017-10-02 LAB — POCT URINE QUALITATIVE DIPSTICK BLOOD

## 2017-10-02 NOTE — Progress Notes (Signed)
   GYNECOLOGY PROBLEM Visit  Subjective:   Tanya Meza is a 37 y.o. [phone] female here for a a problem GYN visit.  Current complaints:      Pelvic pain/Abdominal pain-- on Monday 4/15 at 3AM, woke up out of sleep with severe pain in the lower abdomen R>L. Pain continued to be severe throughout the AM worse midline and then radiated to right. Abdomen is still sore today No fever. Endorses decreased appetite. Still had appendix. No associated diarrhea. Denies dysuria. Has a history of kidney stones and this Does not feel like past kidney stones LMP 09/17/2017 and had a tubal ligation Denies abnormal vaginal bleeding, discharge, problems with intercourse or other gynecologic concerns.    Gynecologic History Patient's last menstrual period was 09/17/2017 (exact date). Contraception: tubal ligation Last Pap: 09/2016. Results were: normal Last mammogram: NA- start at 48  The following portions of the patient's history were reviewed and updated as appropriate: allergies, current medications, past family history, past medical history, past social history, past surgical history and problem list.  Review of Systems Pertinent items are noted in HPI.   Objective:  BP 126/73   Pulse 72   Wt 168 lb (76.2 kg)   LMP 09/17/2017 (Exact Date)   BMI 28.84 kg/m  CONSTITUTIONAL: Well-developed, well-nourished female in no acute distress.  HENT:  Normocephalic, atraumatic, External right and left ear normal. Oropharynx is clear and moist EYES:  No scleral icterus.  NECK: Normal range of motion, supple, no masses.  Normal thyroid.  SKIN: Skin is warm and dry. No rash noted. Not diaphoretic. No erythema. No pallor. NEUROLOGIC: Alert and oriented to person, place, and time. Normal reflexes, muscle tone coordination. No cranial nerve deficit noted. PSYCHIATRIC: Normal mood and affect. Normal behavior. Normal judgment and thought content. CARDIOVASCULAR: Normal heart rate noted, regular rhythm. 2+ distal  pulses. RESPIRATORY: Effort and breath sounds normal, no problems with respiration noted. BREASTS: Symmetric in size. No masses, skin changes, nipple drainage, or lymphadenopathy. ABDOMEN: Soft,  no distention noted.  + TTP at McBurney's point. Negative obturator sign. + rebound worse in RLQ. Mild guarding.   PELVIC: Normal appearing external genitalia; normal appearing vaginal mucosa and cervix.  No abnormal discharge noted. Normal uterine size, no other palpable masses, no uterine. TTP in right adnexa MUSCULOSKELETAL: Normal range of motion.   Assessment and Plan:  1. Pelvic pain - POCT urine qual dipstick blood - Cervicovaginal ancillary only - CT ABDOMEN PELVIS W CONTRAST; Future - CBC  2. Right lower quadrant abdominal pain ddx includes ruptured ovarian cyst which is most likely. ddx does include appendicitis, although this is a subacute presentation. Regarding appendicitis she has some clinic features - exam and anorexia but does not have fever and will get CBC to assess WBC. Reviewed reasons to return to care ASAP, esp to ER given concern for appendicitis. Will do outpatient work up to evaluate. Patient voiced understanding regarding reasons to seek care.  - CT ABDOMEN PELVIS W CONTRAST; Future  3. History of ovarian cyst  Please refer to After Visit Summary for other counseling recommendations.   Return in about 2 weeks (around 10/16/2017) for f/u abdominal/ yearly physical Dr Shawnie PonsPratt.  Federico FlakeKimberly Niles Acelin Ferdig, MD, MPH, ABFM Attending Physician Faculty Practice[facility]

## 2017-10-02 NOTE — Progress Notes (Signed)
Moderate blood on U/A

## 2017-10-03 LAB — CBC
Hematocrit: 37 % (ref 34.0–46.6)
Hemoglobin: 12.2 g/dL (ref 11.1–15.9)
MCH: 28.1 pg (ref 26.6–33.0)
MCHC: 33 g/dL (ref 31.5–35.7)
MCV: 85 fL (ref 79–97)
Platelets: 320 10*3/uL (ref 150–379)
RBC: 4.34 x10E6/uL (ref 3.77–5.28)
RDW: 14.4 % (ref 12.3–15.4)
WBC: 6 10*3/uL (ref 3.4–10.8)

## 2017-10-03 LAB — CERVICOVAGINAL ANCILLARY ONLY
Bacterial vaginitis: NEGATIVE
CANDIDA VAGINITIS: POSITIVE — AB
Chlamydia: NEGATIVE
NEISSERIA GONORRHEA: NEGATIVE
Trichomonas: NEGATIVE

## 2017-10-09 ENCOUNTER — Encounter: Payer: Self-pay | Admitting: Family Medicine

## 2017-10-11 ENCOUNTER — Ambulatory Visit
Admission: RE | Admit: 2017-10-11 | Discharge: 2017-10-11 | Disposition: A | Discharge status: Home / Self Care | Payer: Commercial Managed Care - HMO | Source: Ambulatory Visit | Attending: Family Medicine | Admitting: Family Medicine

## 2017-10-11 DIAGNOSIS — R102 Pelvic and perineal pain: Secondary | ICD-10-CM | POA: Diagnosis not present

## 2017-10-11 DIAGNOSIS — R1031 Right lower quadrant pain: Secondary | ICD-10-CM | POA: Diagnosis not present

## 2017-10-11 MED ORDER — IOPAMIDOL (ISOVUE-370) INJECTION 76%
75.0000 mL | Freq: Once | INTRAVENOUS | Status: AC | PRN
  Administered 2017-10-11: 75 mL via INTRAVENOUS

## 2017-10-22 ENCOUNTER — Ambulatory Visit (INDEPENDENT_AMBULATORY_CARE_PROVIDER_SITE_OTHER): Payer: 59 | Admitting: Family Medicine

## 2017-10-22 ENCOUNTER — Encounter: Payer: Self-pay | Admitting: Family Medicine

## 2017-10-22 VITALS — BP 107/68 | HR 80 | Ht 65.0 in | Wt 164.0 lb

## 2017-10-22 DIAGNOSIS — E282 Polycystic ovarian syndrome: Secondary | ICD-10-CM | POA: Diagnosis not present

## 2017-10-22 DIAGNOSIS — Z124 Encounter for screening for malignant neoplasm of cervix: Secondary | ICD-10-CM

## 2017-10-22 DIAGNOSIS — B373 Candidiasis of vulva and vagina: Secondary | ICD-10-CM | POA: Diagnosis not present

## 2017-10-22 DIAGNOSIS — Z1151 Encounter for screening for human papillomavirus (HPV): Secondary | ICD-10-CM | POA: Diagnosis not present

## 2017-10-22 DIAGNOSIS — Z01411 Encounter for gynecological examination (general) (routine) with abnormal findings: Secondary | ICD-10-CM | POA: Diagnosis not present

## 2017-10-22 DIAGNOSIS — B3731 Acute candidiasis of vulva and vagina: Secondary | ICD-10-CM

## 2017-10-22 MED ORDER — FLUCONAZOLE 150 MG PO TABS: 150.0000 mg | ORAL_TABLET | Freq: Every day | ORAL | 2 refills | Status: DC

## 2017-10-22 NOTE — Assessment & Plan Note (Signed)
Leads to heavy cycles. Discussed endometrial ablation. She is s/p BTL with last C-section. Considering this. Would need Megace prior to procedure.

## 2017-10-22 NOTE — Progress Notes (Signed)
Pt here for AEX and to discuss CT results. Pt also complains of having some vaginal itching.

## 2017-10-22 NOTE — Progress Notes (Signed)
Subjective:     Tanya Meza is a 37 y.o. female and is here for a comprehensive physical exam. The patient reports problems - has irrgular and heavy cycles which come q 2-3 months. notes vaginal irritation and discharge.  Social History   Socioeconomic History  . Marital status: Married    Spouse name: Not on file  . Number of children: 3  . Years of education: Not on file  . Highest education level: Not on file  Occupational History    Employer: [employer]  Social Needs  . Financial resource strain: Not on file  . Food insecurity:    Worry: Not on file    Inability: Not on file  . Transportation needs:    Medical: Not on file    Non-medical: Not on file  Tobacco Use  . Smoking status: Never Smoker  . Smokeless tobacco: Never Used  Substance and Sexual Activity  . Alcohol use: Yes    Comment: social  . Drug use: No  . Sexual activity: Yes    Partners: Male    Birth control/protection: Surgical    Comment: tubaligation.  Lifestyle  . Physical activity:    Days per week: Not on file    Minutes per session: Not on file  . Stress: Not on file  Relationships  . Social connections:    Talks on phone: Not on file    Gets together: Not on file    Attends religious service: Not on file    Active member of club or organization: Not on file    Attends meetings of clubs or organizations: Not on file    Relationship status: Not on file  . Intimate partner violence:    Fear of current or ex partner: Not on file    Emotionally abused: Not on file    Physically abused: Not on file    Forced sexual activity: Not on file  Other Topics Concern  . Not on file  Social History Narrative  . Not on file   Health Maintenance  Topic Date Due  . INFLUENZA VACCINE  01/16/2018  . PAP SMEAR  10/10/2019  . TETANUS/TDAP  05/15/2021  . HIV Screening  Completed    The following portions of the patient's history were reviewed and updated as appropriate: allergies, current medications,  past family history, past medical history, past social history, past surgical history and problem list.  Review of Systems Pertinent items noted in HPI and remainder of comprehensive ROS otherwise negative.   Objective:    BP 107/68   Pulse 80   Ht  (1.651 m)   Wt 164 lb (74.4 kg)   LMP 09/17/2017   BMI 27.29 kg/m  General appearance: alert, cooperative and appears stated age Head: Normocephalic, without obvious abnormality, atraumatic Neck: no adenopathy, supple, symmetrical, trachea midline and thyroid not enlarged, symmetric, no tenderness/mass/nodules Lungs: clear to auscultation bilaterally Breasts: normal appearance, no masses or tenderness Heart: regular rate and rhythm, S1, S2 normal, no murmur, click, rub or gallop Abdomen: soft, non-tender; bowel sounds normal; no masses,  no organomegaly Pelvic: cervix normal in appearance, external genitalia normal, no adnexal masses or tenderness, no cervical motion tenderness, uterus normal size, shape, and consistency and vagina normal without discharge Extremities: extremities normal, atraumatic, no cyanosis or edema Pulses: 2+ and symmetric Skin: Skin color, texture, turgor normal. No rashes or lesions Lymph nodes: Cervical, supraclavicular, and axillary nodes normal. Neurologic: Grossly normal    Assessment:    Healthy  female exam.      Plan:   Problem List Items Addressed This Visit      Unprioritized   PCOS (polycystic ovarian syndrome)    Leads to heavy cycles. Discussed endometrial ablation. She is s/p BTL with last C-section. Considering this. Would need Megace prior to procedure.       Other Visit Diagnoses    Screening for malignant neoplasm of cervix    -  Primary   desires yearly paps   Relevant Orders   Cytology - PAP   Encounter for gynecological examination with abnormal finding       Relevant Orders   CBC   TSH   Comprehensive metabolic panel   Lipid panel   Yeast vaginitis       treat  presumptively   Relevant Medications   fluconazole (DIFLUCAN) 150 MG tablet     Return in 1 year (on 10/23/2018).    See After Visit Summary for Counseling Recommendations

## 2017-10-22 NOTE — Patient Instructions (Signed)
Preventive Care 18-39 Years, Female Preventive care refers to lifestyle choices and visits with your health care provider that can promote health and wellness. What does preventive care include?  A yearly physical exam. This is also called an annual well check.  Dental exams once or twice a year.  Routine eye exams. Ask your health care provider how often you should have your eyes checked.  Personal lifestyle choices, including: ? Daily care of your teeth and gums. ? Regular physical activity. ? Eating a healthy diet. ? Avoiding tobacco and drug use. ? Limiting alcohol use. ? Practicing safe sex. ? Taking vitamin and mineral supplements as recommended by your health care provider. What happens during an annual well check? The services and screenings done by your health care provider during your annual well check will depend on your age, overall health, lifestyle risk factors, and family history of disease. Counseling Your health care provider may ask you questions about your:  Alcohol use.  Tobacco use.  Drug use.  Emotional well-being.  Home and relationship well-being.  Sexual activity.  Eating habits.  Work and work Statistician.  Method of birth control.  Menstrual cycle.  Pregnancy history.  Screening You may have the following tests or measurements:  Height, weight, and BMI.  Diabetes screening. This is done by checking your blood sugar (glucose) after you have not eaten for a while (fasting).  Blood pressure.  Lipid and cholesterol levels. These may be checked every 5 years starting at age 38.  Skin check.  Hepatitis C blood test.  Hepatitis B blood test.  Sexually transmitted disease (STD) testing.  BRCA-related cancer screening. This may be done if you have a family history of breast, ovarian, tubal, or peritoneal cancers.  Pelvic exam and Pap test. This may be done every 3 years starting at age 38. Starting at age 30, this may be done  every 5 years if you have a Pap test in combination with an HPV test.  Discuss your test results, treatment options, and if necessary, the need for more tests with your health care provider. Vaccines Your health care provider may recommend certain vaccines, such as:  Influenza vaccine. This is recommended every year.  Tetanus, diphtheria, and acellular pertussis (Tdap, Td) vaccine. You may need a Td booster every 10 years.  Varicella vaccine. You may need this if you have not been vaccinated.  HPV vaccine. If you are 39 or younger, you may need three doses over 6 months.  Measles, mumps, and rubella (MMR) vaccine. You may need at least one dose of MMR. You may also need a second dose.  Pneumococcal 13-valent conjugate (PCV13) vaccine. You may need this if you have certain conditions and were not previously vaccinated.  Pneumococcal polysaccharide (PPSV23) vaccine. You may need one or two doses if you smoke cigarettes or if you have certain conditions.  Meningococcal vaccine. One dose is recommended if you are age 68-21 years and a first-year college student living in a residence hall, or if you have one of several medical conditions. You may also need additional booster doses.  Hepatitis A vaccine. You may need this if you have certain conditions or if you travel or work in places where you may be exposed to hepatitis A.  Hepatitis B vaccine. You may need this if you have certain conditions or if you travel or work in places where you may be exposed to hepatitis B.  Haemophilus influenzae type b (Hib) vaccine. You may need this  if you have certain risk factors.  Talk to your health care provider about which screenings and vaccines you need and how often you need them. This information is not intended to replace advice given to you by your health care provider. Make sure you discuss any questions you have with your health care provider. Document Released: 07/31/2001 Document Revised:  02/22/2016 Document Reviewed: 04/05/2015 Elsevier Interactive Patient Education  2018 Elsevier Inc.  

## 2017-10-23 LAB — CYTOLOGY - PAP
Diagnosis: NEGATIVE
HPV: NOT DETECTED

## 2018-01-12 ENCOUNTER — Encounter: Payer: Self-pay | Admitting: Emergency Medicine

## 2018-01-12 ENCOUNTER — Emergency Department: Payer: 59

## 2018-01-12 ENCOUNTER — Emergency Department
Admission: EM | Admit: 2018-01-12 | Discharge: 2018-01-12 | Disposition: A | Discharge status: Home / Self Care | Payer: 59 | Attending: Emergency Medicine | Admitting: Emergency Medicine

## 2018-01-12 DIAGNOSIS — R079 Chest pain, unspecified: Secondary | ICD-10-CM | POA: Diagnosis present

## 2018-01-12 DIAGNOSIS — Z91040 Latex allergy status: Secondary | ICD-10-CM | POA: Insufficient documentation

## 2018-01-12 DIAGNOSIS — K2100 Gastro-esophageal reflux disease with esophagitis, without bleeding: Secondary | ICD-10-CM

## 2018-01-12 LAB — BASIC METABOLIC PANEL
Anion gap: 7 (ref 5–15)
BUN: 14 mg/dL (ref 6–20)
CALCIUM: 9.2 mg/dL (ref 8.9–10.3)
CHLORIDE: 103 mmol/L (ref 98–111)
CO2: 29 mmol/L (ref 22–32)
CREATININE: 0.8 mg/dL (ref 0.44–1.00)
GFR calc non Af Amer: >60 mL/min (ref >60)
GLUCOSE: 95 mg/dL (ref 70–99)
Potassium: 3.8 mmol/L (ref 3.5–5.1)
Sodium: 139 mmol/L (ref 135–145)

## 2018-01-12 LAB — CBC
HCT: 38.3 % (ref 35.0–47.0)
HEMOGLOBIN: 12.9 g/dL (ref 12.0–16.0)
MCH: 28.6 pg (ref 26.0–34.0)
MCHC: 33.7 g/dL (ref 32.0–36.0)
MCV: 84.8 fL (ref 80.0–100.0)
PLATELETS: 331 10*3/uL (ref 150–440)
RBC: 4.51 MIL/uL (ref 3.80–5.20)
RDW: 14.8 % — ABNORMAL HIGH (ref 11.5–14.5)
WBC: 6.9 10*3/uL (ref 3.6–11.0)

## 2018-01-12 LAB — TROPONIN I: Troponin I: <0.03 ng/mL (ref <0.03)

## 2018-01-12 MED ORDER — METOCLOPRAMIDE HCL 10 MG PO TABS: 10.0000 mg | ORAL_TABLET | Freq: Four times a day (QID) | ORAL | 0 refills | Status: DC | PRN

## 2018-01-12 MED ORDER — SUCRALFATE 1 G PO TABS: 1.0000 g | ORAL_TABLET | Freq: Four times a day (QID) | ORAL | 1 refills | Status: DC

## 2018-01-12 NOTE — Discharge Instructions (Addendum)
Continue taking zantac 150mg  twice daily for the next week.  Carafate can help soothe the irritated surface of your stomach and esophagus.  Reglan helps hasten stomach emptying and decrease nausea.

## 2018-01-12 NOTE — ED Triage Notes (Signed)
Patient presents to the ED with chest tightness since 3:30am that woke patient up.  Patient reports pain radiating into her jaw slightly and states it is also painful to swallow.  Patient denies nausea, vomiting and diarrhea.  Denies shortness of breath.

## 2018-01-12 NOTE — ED Provider Notes (Signed)
[facility] Emergency Department Provider Note  ____________________________________________  Time seen: Approximately 3:58 PM  I have reviewed the triage vital signs and the nursing notes.   HISTORY  Chief Complaint Chest Pain    HPI Tanya Meza is a 37 y.o. female with a history of GERD and RV outflow tachycardia for which she takes no medicines and has no ongoing follow-up who complains of chest tightness that started at 3:30 AM last night, waking her up.  Radiated to the upper chest.  No shortness of breath diaphoresis nausea vomiting or dizziness.  Not exertional, not pleuritic.  Better sitting upright or walking, worse lying down.  Improved temporarily with Tums, but after taking 150 mg of Zantac this morning it would not resolve so she went to urgent care who then sent her to the ED.  Pain is mild in intensity.     Past Medical History:  Diagnosis Date  . Complication of anesthesia   . GERD (gastroesophageal reflux disease)    with preg, on zantac  . Nephrolithiasis    no surgery required  . PCOS (polycystic ovarian syndrome)   . Tachycardia    RVOF     Patient Active Problem List   Diagnosis Date Noted  . PCOS (polycystic ovarian syndrome) 11/22/2015  . Calculus of kidney 12/30/2014  . Fast heart beat 12/30/2014  . Acute stress disorder 12/30/2014  . Overweight 08/05/2012     Past Surgical History:  Procedure Laterality Date  . CESAREAN SECTION  2005, 2008   x3  . CESAREAN SECTION WITH BILATERAL TUBAL LIGATION    . WISDOM TOOTH EXTRACTION       Prior to Admission medications   Medication Sig Start Date End Date Taking? Authorizing Provider  fluconazole (DIFLUCAN) 150 MG tablet Take 1 tablet (150 mg total) by mouth daily. Repeat in 24 hours if needed 10/22/17   Reva BoresPratt, Tanya S, MD  metoCLOPramide (REGLAN) 10 MG tablet Take 1 tablet (10 mg total) by mouth every 6 (six) hours as needed. 01/12/18   Sharman CheekStafford, Joshwa Hemric, MD  ranitidine  (ZANTAC) 150 MG tablet Take 150 mg by mouth as needed for heartburn. Reported on 11/18/2015    [provider]  Simethicone (ANTI GAS PO) Take 3 tablets by mouth as needed.    [provider]  sucralfate (CARAFATE) 1 g tablet Take 1 tablet (1 g total) by mouth 4 (four) times daily. 01/12/18   Sharman CheekStafford, Lariyah Shetterly, MD     Allergies Latex   Family History  Problem Relation Age of Onset  . Thyroid disease Mother   . Hyperlipidemia Mother   . Hypothyroidism Mother   . Tongue cancer Mother   . Diabetes Father   . Hyperlipidemia Father   . Hypertension Brother   . Heart attack Maternal Grandfather   . Alcohol abuse Paternal Grandfather   . Heart attack Paternal Grandfather   . Hyperlipidemia Brother   . Anesthesia problems Neg Hx     Social History Social History   Tobacco Use  . Smoking status: Never Smoker  . Smokeless tobacco: Never Used  Substance Use Topics  . Alcohol use: Yes    Comment: social  . Drug use: No    Review of Systems  Constitutional:   No fever or chills.  ENT:   Positive sore throat, worse with swallowing.  No rhinorrhea. Cardiovascular:   Positive as above chest pain without syncope. Respiratory:   No dyspnea or cough. Gastrointestinal:   Negative for abdominal  pain, vomiting and diarrhea.  Musculoskeletal:   Negative for focal pain or swelling All other systems reviewed and are negative except as documented above in ROS and HPI.  ____________________________________________   PHYSICAL EXAM:  VITAL SIGNS: ED Triage Vitals  Enc Vitals Group     BP 01/12/18 1333 (!) 110/48     Pulse Rate 01/12/18 1333 68     Resp 01/12/18 1333 16     Temp 01/12/18 1333 98.3 F (36.8 C)     Temp Source 01/12/18 1333 Oral     SpO2 01/12/18 1333 100 %     Weight 01/12/18 1330 163 lb (73.9 kg)     Height 01/12/18 1330 5\' 4"  (1.626 m)     Head Circumference --      Peak Flow --      Pain Score 01/12/18 1330 0     Pain Loc --      Pain Edu? --       Excl. in GC? --     Vital signs reviewed, nursing assessments reviewed.   Constitutional:   Alert and oriented. Non-toxic appearance. Eyes:   Conjunctivae are normal. EOMI. PERRL. ENT      Head:   Normocephalic and atraumatic.      Mouth/Throat:   MMM, positive pharyngeal erythema. No peritonsillar mass.       Neck:   No meningismus. Full ROM. Hematological/Lymphatic/Immunilogical:   No cervical lymphadenopathy. Cardiovascular:   RRR. Symmetric bilateral radial and DP pulses.  No murmurs. Cap refill less than 2 seconds. Respiratory:   Normal respiratory effort without tachypnea/retractions. Breath sounds are clear and equal bilaterally. No wheezes/rales/rhonchi. Gastrointestinal:   Soft and nontender. Non distended. There is no CVA tenderness.  No rebound, rigidity, or guarding. Musculoskeletal:   Normal range of motion in all extremities. No joint effusions.  No lower extremity tenderness.  No edema. Neurologic:   Normal speech and language.  Motor grossly intact. No acute focal neurologic deficits are appreciated.  Skin:    Skin is warm, dry and intact. No rash noted.  No petechiae, purpura, or bullae.  ____________________________________________    LABS (pertinent positives/negatives) (all labs ordered are listed, but only abnormal results are displayed) Labs Reviewed  CBC - Abnormal; Notable for the following components:      Result Value   RDW 14.8 (*)    All other components within normal limits  BASIC METABOLIC PANEL  TROPONIN I  POC URINE PREG, ED   ____________________________________________   EKG  Interpreted by me Normal sinus rhythm rate of 65, normal axis and intervals.  Normal QRS ST segments and T waves.  ____________________________________________    RADIOLOGY  Dg Chest 2 View  Result Date: 01/12/2018 CLINICAL DATA:  Chest tightness which woke her up at 3:30 a.m. Pain extends into the jaw. Pain with swelling. EXAM: CHEST - 2 VIEW COMPARISON:   None. FINDINGS: The heart size and mediastinal contours are within normal limits. Both lungs are clear. The visualized skeletal structures are unremarkable. IMPRESSION: Negative two view chest x-ray Electronically Signed   By: Marin Roberts M.D.   On: 01/12/2018 14:24    ____________________________________________   PROCEDURES Procedures  ____________________________________________    CLINICAL IMPRESSION / ASSESSMENT AND PLAN / ED COURSE  Pertinent labs & imaging results that were available during my care of the patient were reviewed by me and considered in my medical decision making (see chart for details).    Patient presents with atypical chest pain that is very  much consistent with GERD.  Labs and chest x-ray are unremarkable.  EKG is nonischemic.  She is well-appearing with reassuring exam.  Vital signs are normal.  Recommend continue Zantac twice daily for a week, add on Carafate and Reglan.  Follow-up with primary care for further evaluation if symptoms do not resolve.      ____________________________________________   FINAL CLINICAL IMPRESSION(S) / ED DIAGNOSES    Final diagnoses:  Gastroesophageal reflux disease with esophagitis  Nonspecific chest pain     ED Discharge Orders        Ordered    sucralfate (CARAFATE) 1 g tablet  4 times daily     01/12/18 1557    metoCLOPramide (REGLAN) 10 MG tablet  Every 6 hours PRN     01/12/18 1557      Portions of this note were generated with dragon dictation software. Dictation errors may occur despite best attempts at proofreading.    Sharman Cheek, MD 01/12/18 [phone]

## 2018-01-29 ENCOUNTER — Other Ambulatory Visit: Payer: Self-pay

## 2018-01-29 MED ORDER — FLUCONAZOLE 150 MG PO TABS: 150.0000 mg | ORAL_TABLET | Freq: Once | ORAL | 0 refills | Status: AC

## 2018-01-29 NOTE — Telephone Encounter (Signed)
I have called in diflucan for patient.

## 2018-05-19 ENCOUNTER — Encounter: Payer: Self-pay | Admitting: Physician Assistant

## 2018-05-19 ENCOUNTER — Ambulatory Visit: Payer: 59 | Admitting: Physician Assistant

## 2018-05-19 VITALS — BP 100/80 | HR 80 | Temp 98.0°F | Resp 16 | Wt 182.0 lb

## 2018-05-19 DIAGNOSIS — K219 Gastro-esophageal reflux disease without esophagitis: Secondary | ICD-10-CM | POA: Diagnosis not present

## 2018-05-19 DIAGNOSIS — K5904 Chronic idiopathic constipation: Secondary | ICD-10-CM

## 2018-05-19 MED ORDER — OMEPRAZOLE 40 MG PO CPDR: 40.0000 mg | DELAYED_RELEASE_CAPSULE | Freq: Every day | ORAL | 0 refills | Status: DC

## 2018-05-19 MED ORDER — POLYETHYLENE GLYCOL 3350 17 GM/SCOOP PO POWD: 17.0000 g | Freq: Two times a day (BID) | ORAL | 1 refills | Status: DC | PRN

## 2018-05-19 NOTE — Patient Instructions (Signed)
Constipation, Adult Constipation is when a person has fewer bowel movements in a week than normal, has difficulty having a bowel movement, or has stools that are dry, hard, or larger than normal. Constipation may be caused by an underlying condition. It may become worse with age if a person takes certain medicines and does not take in enough fluids. Follow these instructions at home: Eating and drinking   Eat foods that have a lot of fiber, such as fresh fruits and vegetables, whole grains, and beans.  Limit foods that are high in fat, low in fiber, or overly processed, such as french fries, hamburgers, cookies, candies, and soda.  Drink enough fluid to keep your urine clear or pale yellow. General instructions  Exercise regularly or as told by your health care provider.  Go to the restroom when you have the urge to go. Do not hold it in.  Take over-the-counter and prescription medicines only as told by your health care provider. These include any fiber supplements.  Practice pelvic floor retraining exercises, such as deep breathing while relaxing the lower abdomen and pelvic floor relaxation during bowel movements.  Watch your condition for any changes.  Keep all follow-up visits as told by your health care provider. This is important. Contact a health care provider if:  You have pain that gets worse.  You have a fever.  You do not have a bowel movement after 4 days.  You vomit.  You are not hungry.  You lose weight.  You are bleeding from the anus.  You have thin, pencil-like stools. Get help right away if:  You have a fever and your symptoms suddenly get worse.  You leak stool or have blood in your stool.  Your abdomen is bloated.  You have severe pain in your abdomen.  You feel dizzy or you faint. This information is not intended to replace advice given to you by your health care provider. Make sure you discuss any questions you have with your health care  provider. Document Released: 03/02/2004 Document Revised: 12/23/2015 Document Reviewed: 11/23/2015 Elsevier Interactive Patient Education  2018 [employer]  Omeprazole tablets (OTC) What is this medicine? OMEPRAZOLE (oh ME pray zol) prevents the production of acid in the stomach. It is used to treat the symptoms of heartburn. You can buy this medicine without a prescription. This product is not for long-term use, unless otherwise directed by your doctor or health care professional. This medicine may be used for other purposes; ask your health care provider or pharmacist if you have questions. COMMON BRAND NAME(S): Prilosec OTC What should I tell my health care provider before I take this medicine? They need to know if you have any of these conditions: -black or bloody stools -chest pain -difficulty swallowing -have had heartburn for over 3 months -have heartburn with dizziness, lightheadedness or sweating -liver disease -lupus -stomach pain -unexplained weight loss -vomiting with blood -wheezing -an unusual or allergic reaction to omeprazole, other medicines, foods, dyes, or preservatives -pregnant or trying to get pregnant -breast-feeding How should I use this medicine? Take this medicine by mouth. Follow the directions on the product label. If you are taking this medicine without a prescription, take one tablet every day. Do not use for longer than 14 days or repeat a course of treatment more often than every 4 months unless directed by a doctor or healthcare professional. Take your dose at regular intervals every 24 hours. Swallow the tablet whole with a drink of water. Do not  crush, break or chew. This medicine works best if taken on an empty stomach 30 minutes before breakfast. If you are using this medicine with the prescription of your doctor or healthcare professional, follow the directions you were given. Do not take your medicine more often than directed. Talk to your  pediatrician regarding the use of this medicine in children. Special care may be needed. Overdosage: If you think you have taken too much of this medicine contact a poison control center or emergency room at once. NOTE: This medicine is only for you. Do not share this medicine with others. What if I miss a dose? If you miss a dose, take it as soon as you can. If it is almost time for your next dose, take only that dose. Do not take double or extra doses. What may interact with this medicine? Do not take this medicine with any of the following medications: -atazanavir -clopidogrel -nelfinavir This medicine may also interact with the following medications: -ampicillin -certain medicines for anxiety or sleep -certain medicines that treat or prevent blood clots like warfarin -cyclosporine -diazepam -digoxin -disulfiram -iron salts -methotrexate -mycophenolate mofetil -phenytoin -prescription medicine for fungal or yeast infection like itraconazole, ketoconazole, voriconazole -saquinavir -tacrolimus This list may not describe all possible interactions. Give your health care provider a list of all the medicines, herbs, non-prescription drugs, or dietary supplements you use. Also tell them if you smoke, drink alcohol, or use illegal drugs. Some items may interact with your medicine. What should I watch for while using this medicine? It can take several days before your heartburn gets better. Check with your doctor or health care professional if your condition does not start to get better, or if it gets worse. Do not treat diarrhea with over the counter products. Contact your doctor if you have diarrhea that lasts more than 2 days or if it is severe and watery. Do not treat yourself for heartburn with this medicine for more than 14 days in a row. You should only use this medicine for a 2-week treatment period once every 4 months. If your symptoms return shortly after your therapy is complete, or  within the 4 month time frame, call your doctor or health care professional. What side effects may I notice from receiving this medicine? Side effects that you should report to your doctor or health care professional as soon as possible: -allergic reactions like skin rash, itching or hives, swelling of the face, lips, or tongue -bone, muscle or joint pain -breathing problems -chest pain or chest tightness -dark yellow or brown urine -diarrhea -dizziness -fast, irregular heartbeat -feeling faint or lightheaded -fever or sore throat -muscle spasm -palpitations -rash on cheeks or arms that gets worse in the sun -redness, blistering, peeling or loosening of the skin, including inside the mouth -seizures -tremors -unusual bleeding or bruising -unusually weak or tired -yellowing of the eyes or skin Side effects that usually do not require medical attention (report to your doctor or health care professional if they continue or are bothersome): -constipation -dry mouth -headache -loose stools -nausea This list may not describe all possible side effects. Call your doctor for medical advice about side effects. You may report side effects to FDA at 1-800-FDA-1088. Where should I keep my medicine? Keep out of the reach of children. Store at room temperature between 20 and 25 degrees C (68 and 77 degrees F). Protect from light and moisture. Throw away any unused medicine after the expiration date. NOTE: This sheet is a  summary. It may not cover all possible information. If you have questions about this medicine, talk to your doctor, pharmacist, or health care provider.  2018 Elsevier/Gold Standard (2015-07-07 13:06:31)

## 2018-05-19 NOTE — Progress Notes (Signed)
Subjective:    Patient ID: Tanya Meza, female    DOB: [birth year], 37 y.o.   MRN: [id number]  Chief Complaint  Patient presents with  . Gastroesophageal Reflux    HPI Patient is in today for GERD: Paitent complains of heartburn. This has been associated with abdominal bloating.  She denies choking on food and difficulty swallowing. Symptoms have been present for several months. She denies dysphagia.  She has not lost weight. She denies melena, hematochezia, hematemesis, and coffee ground emesis. Medical therapy in the past has included antacids.  She also complains of constipation. This has been a lifelong issue. She reports only having a BM once a week, sometimes longer. When she does go she reports it is an abnormally large amount. Does have some BRB when wiping, only after BM. She has not tried anything for the constipation.    Past Medical History:  Diagnosis Date  . Complication of anesthesia   . GERD (gastroesophageal reflux disease)    with preg, on zantac  . Nephrolithiasis    no surgery required  . PCOS (polycystic ovarian syndrome)   . Tachycardia    RVOF    Past Surgical History:  Procedure Laterality Date  . CESAREAN SECTION  2005, 2008   x3  . CESAREAN SECTION WITH BILATERAL TUBAL LIGATION    . WISDOM TOOTH EXTRACTION      Family History  Problem Relation Age of Onset  . Thyroid disease Mother   . Hyperlipidemia Mother   . Hypothyroidism Mother   . Tongue cancer Mother   . Diabetes Father   . Hyperlipidemia Father   . Hypertension Brother   . Heart attack Maternal Grandfather   . Alcohol abuse Paternal Grandfather   . Heart attack Paternal Grandfather   . Hyperlipidemia Brother   . Anesthesia problems Neg Hx     Social History   Socioeconomic History  . Marital status: Married    Spouse name: Not on file  . Number of children: 3  . Years of education: Not on file  . Highest education level: Not on file  Occupational History    Employer:  [employer]  Social Needs  . Financial resource strain: Not on file  . Food insecurity:    Worry: Not on file    Inability: Not on file  . Transportation needs:    Medical: Not on file    Non-medical: Not on file  Tobacco Use  . Smoking status: Never Smoker  . Smokeless tobacco: Never Used  Substance and Sexual Activity  . Alcohol use: Yes    Comment: social  . Drug use: No  . Sexual activity: Yes    Partners: Male    Birth control/protection: Surgical    Comment: tubaligation.  Lifestyle  . Physical activity:    Days per week: Not on file    Minutes per session: Not on file  . Stress: Not on file  Relationships  . Social connections:    Talks on phone: Not on file    Gets together: Not on file    Attends religious service: Not on file    Active member of club or organization: Not on file    Attends meetings of clubs or organizations: Not on file    Relationship status: Not on file  . Intimate partner violence:    Fear of current or ex partner: Not on file    Emotionally abused: Not on file    Physically abused: Not on  file    Forced sexual activity: Not on file  Other Topics Concern  . Not on file  Social History Narrative  . Not on file    Outpatient Medications Prior to Visit  Medication Sig Dispense Refill  . Simethicone (ANTI GAS PO) Take 3 tablets by mouth as needed.    . fluconazole (DIFLUCAN) 150 MG tablet Take 1 tablet (150 mg total) by mouth daily. Repeat in 24 hours if needed 2 tablet 2  . metoCLOPramide (REGLAN) 10 MG tablet Take 1 tablet (10 mg total) by mouth every 6 (six) hours as needed. 30 tablet 0  . ranitidine (ZANTAC) 150 MG tablet Take 150 mg by mouth as needed for heartburn. Reported on 11/18/2015    . sucralfate (CARAFATE) 1 g tablet Take 1 tablet (1 g total) by mouth 4 (four) times daily. (Patient not taking: Reported on 05/19/2018) 120 tablet 1   No facility-administered medications prior to visit.     Allergies  Allergen Reactions  . Latex  Swelling    Swelling- around catheter    Review of Systems  Constitutional: Negative.   Respiratory: Negative.   Cardiovascular: Negative.   Gastrointestinal: Positive for abdominal pain (bloating), constipation, heartburn and nausea. Negative for blood in stool and melena.  Genitourinary: Negative.   Neurological: Negative.        Objective:    Physical Exam  Constitutional: She is oriented to person, place, and time. She appears well-developed and well-nourished. No distress.  Cardiovascular: Normal rate, regular rhythm and normal heart sounds. Exam reveals no gallop and no friction rub.  No murmur heard. Pulmonary/Chest: Effort normal and breath sounds normal. No respiratory distress. She has no wheezes. She has no rales.  Abdominal: Soft. Normal appearance and bowel sounds are normal. She exhibits no distension and no mass. There is no hepatosplenomegaly. There is tenderness in the right lower quadrant. There is no rebound, no guarding and no CVA tenderness.  Neurological: She is alert and oriented to person, place, and time.  Skin: Skin is warm and dry. She is not diaphoretic.  Vitals reviewed.   BP 100/80 (BP Location: Left Arm, Patient Position: Sitting, Cuff Size: Normal)   Pulse 80   Temp 98 F (36.7 C) (Oral)   Resp 16   Wt 182 lb (82.6 kg)   SpO2 96%   BMI 31.24 kg/m  Wt Readings from Last 3 Encounters:  05/19/18 182 lb (82.6 kg)  01/12/18 163 lb (73.9 kg)  10/22/17 164 lb (74.4 kg)    Health Maintenance Due  Topic Date Due  . INFLUENZA VACCINE  01/16/2018    There are no preventive care reminders to display for this patient.   Lab Results  Component Value Date   TSH 1.950 08/31/2016   Lab Results  Component Value Date   WBC 6.9 01/12/2018   HGB 12.9 01/12/2018   HCT 38.3 01/12/2018   MCV 84.8 01/12/2018   PLT 331 01/12/2018   Lab Results  Component Value Date   NA 139 01/12/2018   K 3.8 01/12/2018   CO2 29 01/12/2018   GLUCOSE 95  01/12/2018   BUN 14 01/12/2018   CREATININE 0.80 01/12/2018   BILITOT 0.2 08/31/2016   ALKPHOS 55 08/31/2016   AST 19 08/31/2016   ALT 18 08/31/2016   PROT 6.9 08/31/2016   ALBUMIN 4.3 08/31/2016   CALCIUM 9.2 01/12/2018   ANIONGAP 7 01/12/2018   Lab Results  Component Value Date   CHOL 197 08/31/2016  Lab Results  Component Value Date   HDL 53 08/31/2016   Lab Results  Component Value Date   LDLCALC 127 (H) 08/31/2016   Lab Results  Component Value Date   TRIG 86 08/31/2016   Lab Results  Component Value Date   CHOLHDL 3.7 08/31/2016   No results found for: HGBA1C     Assessment & Plan:   1. Gastroesophageal reflux disease without esophagitis Will try omeprazole x 14 days. If improving continue medication x 3 months. Call if symptoms worsen or fail to improve. I will see her back in 4 weeks.  - omeprazole (PRILOSEC) 40 MG capsule; Take 1 capsule (40 mg total) by mouth daily.  Dispense: 90 capsule; Refill: 0  2. Chronic idiopathic constipation Will try Miralax as below. Advised she may start with one capful daily, then increase or decrease as needed pending consistency of BM. Goal is to have 1 soft serve consistency BM once daily or every other day. Will see how she is doing in 4 weeks and consider starting linzess if not improving.  - polyethylene glycol powder (GLYCOLAX/MIRALAX) powder; Take 17 g by mouth 2 (two) times daily as needed.  Dispense: 3350 g; Refill: 1  The entirety of the information documented in the History of Present Illness, Review of Systems and Physical Exam were personally obtained by me. Portions of this information were initially documented by Rollen SoxSuli Dimas, CMA and reviewed by me for thoroughness and accuracy.   Margaretann LovelessJennifer M Burnette, PA-C

## 2018-06-13 ENCOUNTER — Ambulatory Visit: Payer: Self-pay | Admitting: Physician Assistant

## 2018-08-08 ENCOUNTER — Ambulatory Visit: Payer: Self-pay | Admitting: Physician Assistant

## 2018-08-15 ENCOUNTER — Encounter: Payer: Self-pay | Admitting: Physician Assistant

## 2018-08-15 ENCOUNTER — Ambulatory Visit
Admission: RE | Admit: 2018-08-15 | Discharge: 2018-08-15 | Disposition: A | Discharge status: Home / Self Care | Payer: 59 | Source: Ambulatory Visit | Attending: Physician Assistant | Admitting: Physician Assistant

## 2018-08-15 ENCOUNTER — Ambulatory Visit: Payer: 59 | Admitting: Physician Assistant

## 2018-08-15 ENCOUNTER — Ambulatory Visit
Admission: RE | Admit: 2018-08-15 | Discharge: 2018-08-15 | Disposition: A | Discharge status: Home / Self Care | Payer: 59 | Attending: Physician Assistant | Admitting: Physician Assistant

## 2018-08-15 ENCOUNTER — Telehealth: Payer: Self-pay

## 2018-08-15 VITALS — BP 117/82 | HR 58 | Temp 98.1°F | Resp 16 | Wt 158.8 lb

## 2018-08-15 DIAGNOSIS — M25551 Pain in right hip: Secondary | ICD-10-CM | POA: Diagnosis present

## 2018-08-15 DIAGNOSIS — K219 Gastro-esophageal reflux disease without esophagitis: Secondary | ICD-10-CM | POA: Diagnosis not present

## 2018-08-15 MED ORDER — METHYLPREDNISOLONE 4 MG PO TBPK: ORAL_TABLET | ORAL | 0 refills | Status: DC

## 2018-08-15 NOTE — Telephone Encounter (Signed)
Patient advised as directed below.  Thanks,  -Unique Sillas 

## 2018-08-15 NOTE — Patient Instructions (Signed)
Hip Pain    The hip is the joint between the upper legs and the lower pelvis. The bones, cartilage, tendons, and muscles of your hip joint support your body and allow you to move around.  Hip pain can range from a minor ache to severe pain in one or both of your hips. The pain may be felt on the inside of the hip joint near the groin, or the outside near the buttocks and upper thigh. You may also have swelling or stiffness.  Follow these instructions at home:  Managing pain, stiffness, and swelling  · If directed, apply ice to the injured area.  ? Put ice in a plastic bag.  ? Place a towel between your skin and the bag.  ? Leave the ice on for 20 minutes, 2-3 times a day  · Sleep with a pillow between your legs on your most comfortable side.  · Avoid any activities that cause pain.  General instructions  · Take over-the-counter and prescription medicines only as told by your health care provider.  · Do any exercises as told by your health care provider.  · Record the following:  ? How often you have hip pain.  ? The location of your pain.  ? What the pain feels like.  ? What makes the pain worse.  · Keep all follow-up visits as told by your health care provider. This is important.  Contact a health care provider if:  · You cannot put weight on your leg.  · Your pain or swelling continues or gets worse after one week.  · It gets harder to walk.  · You have a fever.  Get help right away if:  · You fall.  · You have a sudden increase in pain and swelling in your hip.  · Your hip is red or swollen or very tender to touch.  Summary  · Hip pain can range from a minor ache to severe pain in one or both of your hips.  · The pain may be felt on the inside of the hip joint near the groin, or the outside near the buttocks and upper thigh.  · Avoid any activities that cause pain.  · Record how often you have hip pain, the location of the pain, what makes it worse and what it feels like.  This information is not intended to  replace advice given to you by your health care provider. Make sure you discuss any questions you have with your health care provider.  Document Released: 11/22/2009 Document Revised: 05/07/2016 Document Reviewed: 05/07/2016  Elsevier Interactive Patient Education © 2019 [employer]        Hip Exercises  Ask your health care provider which exercises are safe for you. Do exercises exactly as told by your health care provider and adjust them as directed. It is normal to feel mild stretching, pulling, tightness, or discomfort as you do these exercises, but you should stop right away if you feel sudden pain or your pain gets worse. Do not begin these exercises until told by your health care provider.  Stretching and range of motion exercises  These exercises warm up your muscles and joints and improve the movement and flexibility of your hip. These exercises also help to relieve pain, numbness, and tingling.  Exercise A: Hamstrings, supine    1. Lie on your back.  2. Loop a belt or towel over the ball of your left / right foot. The ball of your foot is on   the walking surface, right under your toes.  3. Straighten your left / right knee and slowly pull on the belt to raise your leg.  ? Do not let your left / right knee bend while you do this.  ? Keep your other leg flat on the floor.  ? Raise the left / right leg until you feel a gentle stretch behind your left / right knee or thigh.  4. Hold this position for __________ seconds.  5. Slowly return your leg to the starting position.  Repeat __________ times. Complete this stretch __________ times a day.  Exercise B: Hip rotators    1. Lie on your back on a firm surface.  2. Hold your left / right knee with your left / right hand. Hold your ankle with your other hand.  3. Gently pull your left / right knee and rotate your lower leg toward your other shoulder.  ? Pull until you feel a stretch in your buttocks.  ? Keep your hips and shoulders firmly planted while you do  this stretch.  4. Hold this position for __________ seconds.  Repeat __________ times. Complete this stretch __________ times a day.  Exercise C: V-sit (hamstrings and adductors)    1. Sit on the floor with your legs extended in a large "V" shape. Keep your knees straight during this exercise.  2. Start with your head and chest upright, then bend at your waist to reach for your left foot (position A). You should feel a stretch in your right inner thigh.  3. Hold this position for __________ seconds. Then slowly return to the upright position.  4. Bend at your waist to reach forward (position B). You should feel a stretch behind both of your thighs and knees.  5. Hold this position for __________ seconds. Then slowly return to the upright position.  6. Bend at your waist to reach for your right foot (position C). You should feel a stretch in your left inner thigh.  7. Hold this position for __________ seconds. Then slowly return to the upright position.  Repeat __________ times. Complete this stretch __________ times a day.  Exercise D: Lunge (hip flexors)    1. Place your left / right knee on the floor and bend your other knee so that is directly over your ankle. You should be half-kneeling.  2. Keep good posture with your head over your shoulders.  3. Tighten your buttocks to point your tailbone downward. This helps your back to keep from arching too much.  4. You should feel a gentle stretch in the front of your left / right thigh and hip. If you do not feel any resistance, slightly slide your other foot forward and then slowly lunge forward so your knee once again lines up over your ankle.  5. Make sure your tailbone continues to point downward.  6. Hold this position for __________ seconds.  Repeat __________ times. Complete this stretch __________ times a day.  Strengthening exercises  These exercises build strength and endurance in your hip. Endurance is the ability to use your muscles for a long time, even  after they get tired.  Exercise E: Bridge (hip extensors)    1. Lie on your back on a firm surface with your knees bent and your feet flat on the floor.  2. Tighten your buttocks muscles and lift your bottom off the floor until the trunk of your body is level with your thighs.  ? Do not arch your back.  ?   You should feel the muscles working in your buttocks and the back of your thighs. If you do not feel these muscles, slide your feet 1-2 inches (2.5-5 cm) farther away from your buttocks.  3. Hold this position for __________ seconds.  4. Slowly lower your hips to the starting position.  5. Let your muscles relax completely between repetitions.  6. If this exercise is too easy, try doing it with your arms crossed over your chest.  Repeat __________ times. Complete this exercise __________ times a day.  Exercise F: Straight leg raises - hip abductors    1. Lie on your side with your left / right leg in the top position. Lie so your head, shoulder, knee, and hip line up with each other. You may bend your bottom knee to help you balance.  2. Roll your hips slightly forward, so your hips are stacked directly over each other and your left / right knee is facing forward.  3. Leading with your heel, lift your top leg 4-6 inches (10-15 cm). You should feel the muscles in your outer hip lifting.  ? Do not let your foot drift forward.  ? Do not let your knee roll toward the ceiling.  4. Hold this position for __________ seconds.  5. Slowly return to the starting position.  6. Let your muscles relax completely between repetitions.  Repeat __________ times. Complete this exercise __________ times a day.  Exercise G: Straight leg raises - hip adductors    1. Lie on your side with your left / right leg in the bottom position. Lie so your head, shoulder, knee, and hip line up. You may place your upper foot in front to help you balance.  2. Roll your hips slightly forward, so your hips are stacked directly over each other and your  left / right knee is facing forward.  3. Tense the muscles in your inner thigh and lift your bottom leg 4-6 inches (10-15 cm).  4. Hold this position for __________ seconds.  5. Slowly return to the starting position.  6. Let your muscles relax completely between repetitions.  Repeat __________ times. Complete this exercise __________ times a day.  Exercise H: Straight leg raises - quadriceps    1. Lie on your back with your left / right leg extended and your other knee bent.  2. Tense the muscles in the front of your left / right thigh. When you do this, you should see your kneecap slide up or see increased dimpling just above your knee.  3. Tighten these muscles even more and raise your leg 4-6 inches (10-15 cm) off the floor.  4. Hold this position for __________ seconds.  5. Keep these muscles tense as you lower your leg.  6. Relax the muscles slowly and completely between repetitions.  Repeat __________ times. Complete this exercise __________ times a day.  Exercise I: Hip abductors, standing  1. Tie one end of a rubber exercise band or tubing to a secure surface, such as a table or pole.  2. Loop the other end of the band or tubing around your left / right ankle.  3. Keeping your ankle with the band or tubing directly opposite of the secured end, step away until there is tension in the tubing or band. Hold onto a chair as needed for balance.  4. Lift your left / right leg out to your side. While you do this:  ? Keep your back upright.  ? Keep your shoulders   over your hips.  ? Keep your toes pointing forward.  ? Make sure to use your hip muscles to lift your leg. Do not "throw" your leg or tip your body to lift your leg.  5. Hold this position for __________ seconds.  6. Slowly return to the starting position.  Repeat __________ times. Complete this exercise __________ times a day.  Exercise J: Squats (quadriceps)  1. Stand in a door frame so your feet and knees are in line with the frame. You may place your  hands on the frame for balance.  2. Slowly bend your knees and lower your hips like you are going to sit in a chair.  ? Keep your lower legs in a straight-up-and-down position.  ? Do not let your hips go lower than your knees.  ? Do not bend your knees lower than told by your health care provider.  ? If your hip pain increases, do not bend as low.  3. Hold this position for ___________ seconds.  4. Slowly push with your legs to return to standing. Do not use your hands to pull yourself to standing.  Repeat __________ times. Complete this exercise __________ times a day.  This information is not intended to replace advice given to you by your health care provider. Make sure you discuss any questions you have with your health care provider.  Document Released: 06/22/2005 Document Revised: 10/08/2017 Document Reviewed: 05/30/2015  Elsevier Interactive Patient Education © 2019 [employer]

## 2018-08-15 NOTE — Progress Notes (Signed)
Patient: Tanya Meza Female    DOB: 07/08/1980   38 y.o.   MRN: [id number] Visit Date: 08/15/2018  Today's Provider: Margaretann Loveless, PA-C   Chief Complaint  Patient presents with  . Follow-up    GERD   Subjective:     HPI   GERD, Follow up:  The patient was last seen for GERD 1-2 months ago. Changes made since that visit include try omeprazole x 14 days. If improving continue medication x 3 months.  She reports excellent compliance with treatment. She is not having side effects.   She IS experiencing none.. She is NOT experiencing abdominal bloating, belching, chest pain, choking on food, cough, deep pressure at base of neck, fullness after meals, heartburn or nausea   She also reports that she is having some right sided hip pain. States it feels deep in the joint. Does not always bother her. Sometimes feels it catch.  ------------------------------------------------------------------------    Allergies  Allergen Reactions  . Latex Swelling    Swelling- around catheter     Current Outpatient Medications:  .  omeprazole (PRILOSEC) 40 MG capsule, Take 1 capsule (40 mg total) by mouth daily., Disp: 90 capsule, Rfl: 0 .  polyethylene glycol powder (GLYCOLAX/MIRALAX) powder, Take 17 g by mouth 2 (two) times daily as needed., Disp: 3350 g, Rfl: 1 .  Simethicone (ANTI GAS PO), Take 3 tablets by mouth as needed., Disp: , Rfl:   Review of Systems  Constitutional: Negative.   Respiratory: Negative.   Cardiovascular: Negative.   Gastrointestinal: Negative.   Neurological: Negative.     Social History   Tobacco Use  . Smoking status: Never Smoker  . Smokeless tobacco: Never Used  Substance Use Topics  . Alcohol use: Yes    Comment: social      Objective:   BP 117/82 (BP Location: Left Arm, Patient Position: Sitting, Cuff Size: Normal)   Pulse (!) 58   Temp 98.1 F (36.7 C) (Oral)   Resp 16   Wt 158 lb 12.8 oz (72 kg)   BMI 27.26 kg/m  Vitals:    08/15/18 0857  BP: 117/82  Pulse: (!) 58  Resp: 16  Temp: 98.1 F (36.7 C)  TempSrc: Oral  Weight: 158 lb 12.8 oz (72 kg)     Physical Exam Vitals signs reviewed.  Constitutional:      Appearance: Normal appearance. She is well-developed and normal weight.  HENT:     Head: Normocephalic and atraumatic.  Neck:     Musculoskeletal: Normal range of motion and neck supple.  Pulmonary:     Effort: Pulmonary effort is normal. No respiratory distress.  Neurological:     Mental Status: She is alert.  Psychiatric:        Mood and Affect: Mood normal.        Behavior: Behavior normal.        Thought Content: Thought content normal.        Judgment: Judgment normal.        Assessment & Plan    1. Pain of right hip joint Will get imaging as below to see if any bony abnormalities are present. Medrol dose pak given as below for pain and inflammation. I will f/u pending results for further treatment plan.  - DG Hip Unilat W OR W/O Pelvis 2-3 Views Right; Future - methylPREDNISolone (MEDROL) 4 MG TBPK tablet; 6 day taper; take as directed on package instructions  Dispense: 21 tablet; Refill:  0  2. Gastroesophageal reflux disease without esophagitis Improved. Use prilosec as needed.      Margaretann Loveless, PA-C  [facility]

## 2018-08-15 NOTE — Telephone Encounter (Signed)
-----   Message from Margaretann Loveless, [place] sent at 08/15/2018  3:10 PM EST ----- No arthritis in the right hip. Try the medrol and then let me know if not better.

## 2018-10-31 ENCOUNTER — Other Ambulatory Visit: Payer: Self-pay | Admitting: *Deleted

## 2018-10-31 MED ORDER — FLUCONAZOLE 150 MG PO TABS: 150.0000 mg | ORAL_TABLET | Freq: Once | ORAL | 1 refills | Status: AC

## 2018-10-31 NOTE — Telephone Encounter (Signed)
Pt called requesting refill on diflucan for a yeast infection, refill sent into pharmacy.

## 2018-12-30 ENCOUNTER — Ambulatory Visit (INDEPENDENT_AMBULATORY_CARE_PROVIDER_SITE_OTHER): Payer: 59 | Admitting: Family Medicine

## 2018-12-30 ENCOUNTER — Other Ambulatory Visit: Payer: Self-pay

## 2018-12-30 ENCOUNTER — Encounter: Payer: Self-pay | Admitting: Family Medicine

## 2018-12-30 VITALS — BP 127/72 | HR 76 | Temp 98.0°F | Ht 65.0 in | Wt 188.8 lb

## 2018-12-30 DIAGNOSIS — Z1151 Encounter for screening for human papillomavirus (HPV): Secondary | ICD-10-CM

## 2018-12-30 DIAGNOSIS — Z124 Encounter for screening for malignant neoplasm of cervix: Secondary | ICD-10-CM | POA: Diagnosis not present

## 2018-12-30 DIAGNOSIS — N920 Excessive and frequent menstruation with regular cycle: Secondary | ICD-10-CM

## 2018-12-30 DIAGNOSIS — Z01411 Encounter for gynecological examination (general) (routine) with abnormal findings: Secondary | ICD-10-CM

## 2018-12-30 DIAGNOSIS — E669 Obesity, unspecified: Secondary | ICD-10-CM | POA: Insufficient documentation

## 2018-12-30 MED ORDER — PHENTERMINE HCL 37.5 MG PO CAPS: 37.5000 mg | ORAL_CAPSULE | ORAL | 2 refills | Status: DC

## 2018-12-30 MED ORDER — MEGESTROL ACETATE 40 MG PO TABS: 40.0000 mg | ORAL_TABLET | Freq: Two times a day (BID) | ORAL | 3 refills | Status: DC

## 2018-12-30 NOTE — Assessment & Plan Note (Signed)
Trial of Phentermine.

## 2018-12-30 NOTE — Patient Instructions (Signed)
 Preventive Care 21-39 Years Old, Female Preventive care refers to visits with your health care provider and lifestyle choices that can promote health and wellness. This includes:  A yearly physical exam. This may also be called an annual well check.  Regular dental visits and eye exams.  Immunizations.  Screening for certain conditions.  Healthy lifestyle choices, such as eating a healthy diet, getting regular exercise, not using drugs or products that contain nicotine and tobacco, and limiting alcohol use. What can I expect for my preventive care visit? Physical exam Your health care provider will check your:  Height and weight. This may be used to calculate body mass index (BMI), which tells if you are at a healthy weight.  Heart rate and blood pressure.  Skin for abnormal spots. Counseling Your health care provider may ask you questions about your:  Alcohol, tobacco, and drug use.  Emotional well-being.  Home and relationship well-being.  Sexual activity.  Eating habits.  Work and work environment.  Method of birth control.  Menstrual cycle.  Pregnancy history. What immunizations do I need?  Influenza (flu) vaccine  This is recommended every year. Tetanus, diphtheria, and pertussis (Tdap) vaccine  You may need a Td booster every 10 years. Varicella (chickenpox) vaccine  You may need this if you have not been vaccinated. Human papillomavirus (HPV) vaccine  If recommended by your health care provider, you may need three doses over 6 months. Measles, mumps, and rubella (MMR) vaccine  You may need at least one dose of MMR. You may also need a second dose. Meningococcal conjugate (MenACWY) vaccine  One dose is recommended if you are age 19-21 years and a first-year college student living in a residence hall, or if you have one of several medical conditions. You may also need additional booster doses. Pneumococcal conjugate (PCV13) vaccine  You may need  this if you have certain conditions and were not previously vaccinated. Pneumococcal polysaccharide (PPSV23) vaccine  You may need one or two doses if you smoke cigarettes or if you have certain conditions. Hepatitis A vaccine  You may need this if you have certain conditions or if you travel or work in places where you may be exposed to hepatitis A. Hepatitis B vaccine  You may need this if you have certain conditions or if you travel or work in places where you may be exposed to hepatitis B. Haemophilus influenzae type b (Hib) vaccine  You may need this if you have certain conditions. You may receive vaccines as individual doses or as more than one vaccine together in one shot (combination vaccines). Talk with your health care provider about the risks and benefits of combination vaccines. What tests do I need?  Blood tests  Lipid and cholesterol levels. These may be checked every 5 years starting at age 20.  Hepatitis C test.  Hepatitis B test. Screening  Diabetes screening. This is done by checking your blood sugar (glucose) after you have not eaten for a while (fasting).  Sexually transmitted disease (STD) testing.  BRCA-related cancer screening. This may be done if you have a family history of breast, ovarian, tubal, or peritoneal cancers.  Pelvic exam and Pap test. This may be done every 3 years starting at age 21. Starting at age 30, this may be done every 5 years if you have a Pap test in combination with an HPV test. Talk with your health care provider about your test results, treatment options, and if necessary, the need for more   tests. Follow these instructions at home: Eating and drinking   Eat a diet that includes fresh fruits and vegetables, whole grains, lean protein, and low-fat dairy.  Take vitamin and mineral supplements as recommended by your health care provider.  Do not drink alcohol if: ? Your health care provider tells you not to drink. ? You are  pregnant, may be pregnant, or are planning to become pregnant.  If you drink alcohol: ? Limit how much you have to 0-1 drink a day. ? Be aware of how much alcohol is in your drink. In the [place] one drink equals one 12 oz bottle of beer (355 mL), one 5 oz glass of wine (148 mL), or one 1 oz glass of hard liquor (44 mL). Lifestyle  Take daily care of your teeth and gums.  Stay active. Exercise for at least 30 minutes on 5 or more days each week.  Do not use any products that contain nicotine or tobacco, such as cigarettes, e-cigarettes, and chewing tobacco. If you need help quitting, ask your health care provider.  If you are sexually active, practice safe sex. Use a condom or other form of birth control (contraception) in order to prevent pregnancy and STIs (sexually transmitted infections). If you plan to become pregnant, see your health care provider for a preconception visit. What's next?  Visit your health care provider once a year for a well check visit.  Ask your health care provider how often you should have your eyes and teeth checked.  Stay up to date on all vaccines. This information is not intended to replace advice given to you by your health care provider. Make sure you discuss any questions you have with your health care provider. Document Released: 07/31/2001 Document Revised: 02/13/2018 Document Reviewed: 02/13/2018 Elsevier Patient Education  2020 [employer].

## 2018-12-30 NOTE — Progress Notes (Signed)
  Subjective:     Tanya Meza is a 38 y.o. female and is here for a comprehensive physical exam. The patient reports problems - heavy menses. Has heavy cycles. Worse after birth of last child. S/p BTL with last C-section. Wants to have endometrial ablation.  The following portions of the patient's history were reviewed and updated as appropriate: allergies, current medications, past family history, past medical history, past social history, past surgical history and problem list.  Review of Systems Pertinent items noted in HPI and remainder of comprehensive ROS otherwise negative.   Objective:    BP 127/72 (BP Location: Right Arm, Patient Position: Sitting, Cuff Size: Normal)   Pulse 76   Temp 98 F (36.7 C) (Oral)   Ht 5\' 5"  (1.651 m)   Wt 188 lb 12.8 oz (85.6 kg)   LMP 12/20/2018 (Exact Date)   BMI 31.42 kg/m  General appearance: alert, cooperative and appears stated age Head: Normocephalic, without obvious abnormality, atraumatic Neck: no adenopathy, supple, symmetrical, trachea midline and thyroid not enlarged, symmetric, no tenderness/mass/nodules Lungs: clear to auscultation bilaterally Heart: regular rate and rhythm, S1, S2 normal, no murmur, click, rub or gallop Abdomen: soft, non-tender; bowel sounds normal; no masses,  no organomegaly Pelvic: cervix normal in appearance, external genitalia normal, no adnexal masses or tenderness, no cervical motion tenderness, uterus normal size, shape, and consistency and vagina normal without discharge Extremities: extremities normal, atraumatic, no cyanosis or edema Pulses: 2+ and symmetric Skin: Skin color, texture, turgor normal. No rashes or lesions Lymph nodes: Cervical, supraclavicular, and axillary nodes normal. Neurologic: Grossly normal    Assessment:    Healthy female exam.      Plan:      Problem List Items Addressed This Visit      Unprioritized   Menorrhagia with regular cycle - Primary    Not improving--Would  like to try ablation. Needs Megace x 4 wks prior to procedure. Risks include but are not limited to bleeding, infection, injury to surrounding structures, including bowel, bladder and ureters, blood clots, and death.  Likelihood of success is high.       Relevant Medications   megestrol (MEGACE) 40 MG tablet   Obesity (BMI 30-39.9)    Trial of Phentermine.      Relevant Medications   phentermine 37.5 MG capsule    Other Visit Diagnoses    Screening for malignant neoplasm of cervix       Relevant Orders   Cytology - PAP( [place])   Encounter for gynecological examination with abnormal finding         Return in 1 year (on 12/30/2019).  See After Visit Summary for Counseling Recommendations

## 2018-12-30 NOTE — Assessment & Plan Note (Addendum)
Not improving--Would like to try ablation. Needs Megace x 4 wks prior to procedure. Risks include but are not limited to bleeding, infection, injury to surrounding structures, including bowel, bladder and ureters, blood clots, and death.  Likelihood of success is high.

## 2019-01-02 LAB — CYTOLOGY - PAP
Diagnosis: NEGATIVE
HPV: NOT DETECTED

## 2019-01-09 ENCOUNTER — Other Ambulatory Visit: Payer: Self-pay | Admitting: Physician Assistant

## 2019-01-09 DIAGNOSIS — K219 Gastro-esophageal reflux disease without esophagitis: Secondary | ICD-10-CM

## 2019-03-18 ENCOUNTER — Ambulatory Visit (HOSPITAL_BASED_OUTPATIENT_CLINIC_OR_DEPARTMENT_OTHER): Admission: RE | Admit: 2019-03-18 | Payer: 59 | Source: Home / Self Care | Admitting: Family Medicine

## 2019-03-18 ENCOUNTER — Encounter (HOSPITAL_BASED_OUTPATIENT_CLINIC_OR_DEPARTMENT_OTHER): Admission: RE | Payer: Self-pay | Source: Home / Self Care

## 2019-03-18 SURGERY — DILATATION & CURETTAGE/HYSTEROSCOPY WITH HYDROTHERMAL ABLATION
Anesthesia: Choice

## 2019-04-02 NOTE — Progress Notes (Signed)
Patient: Tanya Meza, Female    DOB: 09/29/1980, 37 y.o.   MRN: [id number] Visit Date: 04/03/2019  Today's Provider: Mar Daring, PA-C   Chief Complaint  Patient presents with  . Annual Exam   Subjective:     Annual physical exam Tanya Meza is a 38 y.o. female who presents today for health maintenance and complete physical. She feels well. She reports exercising none. She reports she is sleeping well. -----------------------------------------------------------  Review of Systems  Constitutional: Negative.   HENT: Negative.   Eyes: Negative.   Respiratory: Negative.   Cardiovascular: Negative.   Gastrointestinal: Negative.   Endocrine: Negative.   Genitourinary: Negative.   Musculoskeletal: Negative.   Skin: Negative.   Allergic/Immunologic: Negative.   Neurological: Negative.   Hematological: Negative.   Psychiatric/Behavioral: Negative.     Social History      She  reports that she has never smoked. She has never used smokeless tobacco. She reports current alcohol use. She reports that she does not use drugs.       Social History   Socioeconomic History  . Marital status: Married    Spouse name: Not on file  . Number of children: 3  . Years of education: Not on file  . Highest education level: Not on file  Occupational History    Employer: [employer]  . Financial resource strain: Not on file  . Food insecurity    Worry: Not on file    Inability: Not on file  . Transportation needs    Medical: Not on file    Non-medical: Not on file  Tobacco Use  . Smoking status: Never Smoker  . Smokeless tobacco: Never Used  Substance and Sexual Activity  . Alcohol use: Yes    Comment: social  . Drug use: No  . Sexual activity: Yes    Partners: Male    Birth control/protection: Surgical    Comment: tubaligation.  Lifestyle  . Physical activity    Days per week: Not on file    Minutes per session: Not on file  . Stress: Not on  file  Relationships  . Social Herbalist on phone: Not on file    Gets together: Not on file    Attends religious service: Not on file    Active member of club or organization: Not on file    Attends meetings of clubs or organizations: Not on file    Relationship status: Not on file  Other Topics Concern  . Not on file  Social History Narrative  . Not on file    Past Medical History:  Diagnosis Date  . Complication of anesthesia   . GERD (gastroesophageal reflux disease)    with preg, on zantac  . Nephrolithiasis    no surgery required  . PCOS (polycystic ovarian syndrome)   . Tachycardia    RVOF     Patient Active Problem List   Diagnosis Date Noted  . Menorrhagia with regular cycle 12/30/2018  . Obesity (BMI 30-39.9) 12/30/2018  . GERD (gastroesophageal reflux disease) 08/15/2018  . PCOS (polycystic ovarian syndrome) 11/22/2015  . Calculus of kidney 12/30/2014  . Fast heart beat 12/30/2014  . Acute stress disorder 12/30/2014    Past Surgical History:  Procedure Laterality Date  . CESAREAN SECTION  2005, 2008   x3  . CESAREAN SECTION WITH BILATERAL TUBAL LIGATION    . WISDOM TOOTH EXTRACTION  Family History        Family Status  Relation Name Status  . Mother  Alive  . Father  Alive  . Brother 1 Alive  . MGF  Deceased  . PGF  Deceased  . Brother 2 Alive  . Brother 3 Alive  . Daughter  Alive  . Son  Alive  . Daughter  Alive  . Neg Hx  (Not Specified)        Her family history includes Alcohol abuse in her paternal grandfather; Diabetes in her father; Heart attack in her maternal grandfather and paternal grandfather; Hyperlipidemia in her brother, father, and mother; Hypertension in her brother; Hypothyroidism in her mother; Thyroid disease in her mother; Tongue cancer in her mother. There is no history of Anesthesia problems.      Allergies  Allergen Reactions  . Latex Swelling    Swelling- around catheter     Current Outpatient  Medications:  .  omeprazole (PRILOSEC) 40 MG capsule, Take 1 capsule by mouth once daily, Disp: 90 capsule, Rfl: 0 .  phentermine 37.5 MG capsule, Take 1 capsule (37.5 mg total) by mouth every morning., Disp: 30 capsule, Rfl: 2 .  polyethylene glycol powder (GLYCOLAX/MIRALAX) powder, Take 17 g by mouth 2 (two) times daily as needed., Disp: 3350 g, Rfl: 1 .  Simethicone (ANTI GAS PO), Take 3 tablets by mouth as needed., Disp: , Rfl:  .  megestrol (MEGACE) 40 MG tablet, Take 1 tablet (40 mg total) by mouth 2 (two) times daily. (Patient not taking: Reported on 04/03/2019), Disp: 30 tablet, Rfl: 3 .  methylPREDNISolone (MEDROL) 4 MG TBPK tablet, 6 day taper; take as directed on package instructions (Patient not taking: Reported on 04/03/2019), Disp: 21 tablet, Rfl: 0 .  ranitidine (ZANTAC) 150 MG tablet, Take by mouth., Disp: , Rfl:    Patient Care Team: Reine Just as PCP - General (Physician Assistant)    Objective:    Vitals: BP 98/64 (BP Location: Right Arm, Patient Position: Sitting, Cuff Size: Large)   Pulse 80   Temp (!) 97.5 F (36.4 C) (Temporal)   Resp 18   Ht 5\' 5"  (1.651 m)   Wt 171 lb (77.6 kg)   LMP 03/22/2019   SpO2 97%   BMI 28.46 kg/m    Vitals:   04/03/19 0921  BP: 98/64  Pulse: 80  Resp: 18  Temp: (!) 97.5 F (36.4 C)  TempSrc: Temporal  SpO2: 97%  Weight: 171 lb (77.6 kg)  Height: 5\' 5"  (1.651 m)     Physical Exam Vitals signs reviewed.  Constitutional:      General: She is not in acute distress.    Appearance: Normal appearance. She is well-developed. She is not ill-appearing or diaphoretic.  HENT:     Head: Normocephalic and atraumatic.     Right Ear: Tympanic membrane, ear canal and external ear normal.     Left Ear: Tympanic membrane, ear canal and external ear normal.     Nose: Nose normal.     Mouth/Throat:     Mouth: Mucous membranes are moist.     Pharynx: No oropharyngeal exudate or posterior oropharyngeal erythema.  Eyes:      General: No scleral icterus.       Right eye: No discharge.        Left eye: No discharge.     Extraocular Movements: Extraocular movements intact.     Conjunctiva/sclera: Conjunctivae normal.     Pupils: Pupils are equal,  round, and reactive to light.  Neck:     Musculoskeletal: Normal range of motion and neck supple.     Thyroid: No thyromegaly.     Vascular: No JVD.     Trachea: No tracheal deviation.  Cardiovascular:     Rate and Rhythm: Normal rate and regular rhythm.     Pulses: Normal pulses.     Heart sounds: Normal heart sounds. No murmur. No friction rub. No gallop.   Pulmonary:     Effort: Pulmonary effort is normal. No respiratory distress.     Breath sounds: Normal breath sounds. No wheezing or rales.  Chest:     Chest wall: No tenderness.     Breasts:        Right: Normal.        Left: Normal.     Comments: Breast are fibrous and dense Abdominal:     General: Abdomen is flat. Bowel sounds are normal. There is no distension.     Palpations: Abdomen is soft. There is no mass.     Tenderness: There is no abdominal tenderness. There is no guarding or rebound.  Musculoskeletal: Normal range of motion.        General: No tenderness.     Right lower leg: No edema.     Left lower leg: No edema.  Lymphadenopathy:     Cervical: No cervical adenopathy.  Skin:    General: Skin is warm and dry.     Capillary Refill: Capillary refill takes less than 2 seconds.     Findings: No rash.  Neurological:     General: No focal deficit present.     Mental Status: She is alert and oriented to person, place, and time. Mental status is at baseline.  Psychiatric:        Mood and Affect: Mood normal.        Behavior: Behavior normal.        Thought Content: Thought content normal.        Judgment: Judgment normal.      Depression Screen PHQ 2/9 Scores 04/03/2019 08/24/2016  PHQ - 2 Score 0 2  PHQ- 9 Score 0 5       Assessment & Plan:     Routine Health Maintenance and  Physical Exam  Exercise Activities and Dietary recommendations Goals    . Exercise 150 minutes per week (moderate activity)       Immunization History  Administered Date(s) Administered  . Influenza Split 04/24/2011, 08/05/2012  . PPD Test 07/23/2011  . Tdap 05/16/2011    Health Maintenance  Topic Date Due  . INFLUENZA VACCINE  01/17/2019  . TETANUS/TDAP  05/15/2021  . PAP SMEAR-Modifier  12/29/2021  . HIV Screening  Completed     Discussed health benefits of physical activity, and encouraged her to engage in regular exercise appropriate for her age and condition.    1. Annual physical exam Normal physical exam today. Will check labs as below and f/u pending lab results. If labs are stable and WNL she will not need to have these rechecked for one year at her next annual physical exam. She is to call the office in the meantime if she has any acute issue, questions or concerns. - CBC w/Diff/Platelet - Comprehensive Metabolic Panel (CMET) - TSH - Lipid Profile - HgB A1c  2. Encounter for breast cancer screening using non-mammogram modality Breast exam today was normal. There is family history of breast cancer. She does perform regular self breast exams.  Mammogram was ordered as below. Information for [facility] Breast clinic was given to patient so she may schedule her mammogram at her convenience. - MM 3D SCREEN BREAST BILATERAL; Future  3. Family history of breast cancer Maternal great aunt passed at 51 yr old from breast cancer. - MM 3D SCREEN BREAST BILATERAL; Future  --------------------------------------------------------------------    Margaretann Loveless, PA-C  [facility]

## 2019-04-03 ENCOUNTER — Other Ambulatory Visit: Payer: Self-pay

## 2019-04-03 ENCOUNTER — Encounter: Payer: Self-pay | Admitting: Physician Assistant

## 2019-04-03 ENCOUNTER — Ambulatory Visit (INDEPENDENT_AMBULATORY_CARE_PROVIDER_SITE_OTHER): Payer: 59 | Admitting: Physician Assistant

## 2019-04-03 VITALS — BP 98/64 | HR 80 | Temp 97.5°F | Resp 18 | Ht 65.0 in | Wt 171.0 lb

## 2019-04-03 DIAGNOSIS — Z803 Family history of malignant neoplasm of breast: Secondary | ICD-10-CM | POA: Diagnosis not present

## 2019-04-03 DIAGNOSIS — Z0000 Encounter for general adult medical examination without abnormal findings: Secondary | ICD-10-CM | POA: Diagnosis not present

## 2019-04-03 DIAGNOSIS — Z1239 Encounter for other screening for malignant neoplasm of breast: Secondary | ICD-10-CM | POA: Diagnosis not present

## 2019-04-03 NOTE — Patient Instructions (Signed)
Health Maintenance, Female Adopting a healthy lifestyle and getting preventive care are important in promoting health and wellness. Ask your health care provider about:  The right schedule for you to have regular tests and exams.  Things you can do on your own to prevent diseases and keep yourself healthy. What should I know about diet, weight, and exercise? Eat a healthy diet   Eat a diet that includes plenty of vegetables, fruits, low-fat dairy products, and lean protein.  Do not eat a lot of foods that are high in solid fats, added sugars, or sodium. Maintain a healthy weight Body mass index (BMI) is used to identify weight problems. It estimates body fat based on height and weight. Your health care provider can help determine your BMI and help you achieve or maintain a healthy weight. Get regular exercise Get regular exercise. This is one of the most important things you can do for your health. Most adults should:  Exercise for at least 150 minutes each week. The exercise should increase your heart rate and make you sweat (moderate-intensity exercise).  Do strengthening exercises at least twice a week. This is in addition to the moderate-intensity exercise.  Spend less time sitting. Even light physical activity can be beneficial. Watch cholesterol and blood lipids Have your blood tested for lipids and cholesterol at 40 years of age, then have this test every 5 years. Have your cholesterol levels checked more often if:  Your lipid or cholesterol levels are high.  You are older than 40 years of age.  You are at high risk for heart disease. What should I know about cancer screening? Depending on your health history and family history, you may need to have cancer screening at various ages. This may include screening for:  Breast cancer.  Cervical cancer.  Colorectal cancer.  Skin cancer.  Lung cancer. What should I know about heart disease, diabetes, and high blood  pressure? Blood pressure and heart disease  High blood pressure causes heart disease and increases the risk of stroke. This is more likely to develop in people who have high blood pressure readings, are of African descent, or are overweight.  Have your blood pressure checked: ? Every 3-5 years if you are 18-39 years of age. ? Every year if you are 40 years old or older. Diabetes Have regular diabetes screenings. This checks your fasting blood sugar level. Have the screening done:  Once every three years after age 40 if you are at a normal weight and have a low risk for diabetes.  More often and at a younger age if you are overweight or have a high risk for diabetes. What should I know about preventing infection? Hepatitis B If you have a higher risk for hepatitis B, you should be screened for this virus. Talk with your health care provider to find out if you are at risk for hepatitis B infection. Hepatitis C Testing is recommended for:  Everyone born from 1945 through 1965.  Anyone with known risk factors for hepatitis C. Sexually transmitted infections (STIs)  Get screened for STIs, including gonorrhea and chlamydia, if: ? You are sexually active and are younger than 40 years of age. ? You are older than 40 years of age and your health care provider tells you that you are at risk for this type of infection. ? Your sexual activity has changed since you were last screened, and you are at increased risk for chlamydia or gonorrhea. Ask your health care provider if   you are at risk.  Ask your health care provider about whether you are at high risk for HIV. Your health care provider may recommend a prescription medicine to help prevent HIV infection. If you choose to take medicine to prevent HIV, you should first get tested for HIV. You should then be tested every 3 months for as long as you are taking the medicine. Pregnancy  If you are about to stop having your period (premenopausal) and  you may become pregnant, seek counseling before you get pregnant.  Take 400 to 800 micrograms (mcg) of folic acid every day if you become pregnant.  Ask for birth control (contraception) if you want to prevent pregnancy. Osteoporosis and menopause Osteoporosis is a disease in which the bones lose minerals and strength with aging. This can result in bone fractures. If you are 65 years old or older, or if you are at risk for osteoporosis and fractures, ask your health care provider if you should:  Be screened for bone loss.  Take a calcium or vitamin D supplement to lower your risk of fractures.  Be given hormone replacement therapy (HRT) to treat symptoms of menopause. Follow these instructions at home: Lifestyle  Do not use any products that contain nicotine or tobacco, such as cigarettes, e-cigarettes, and chewing tobacco. If you need help quitting, ask your health care provider.  Do not use street drugs.  Do not share needles.  Ask your health care provider for help if you need support or information about quitting drugs. Alcohol use  Do not drink alcohol if: ? Your health care provider tells you not to drink. ? You are pregnant, may be pregnant, or are planning to become pregnant.  If you drink alcohol: ? Limit how much you use to 0-1 drink a day. ? Limit intake if you are breastfeeding.  Be aware of how much alcohol is in your drink. In the [place] one drink equals one 12 oz bottle of beer (355 mL), one 5 oz glass of wine (148 mL), or one 1 oz glass of hard liquor (44 mL). General instructions  Schedule regular health, dental, and eye exams.  Stay current with your vaccines.  Tell your health care provider if: ? You often feel depressed. ? You have ever been abused or do not feel safe at home. Summary  Adopting a healthy lifestyle and getting preventive care are important in promoting health and wellness.  Follow your health care provider's instructions about healthy  diet, exercising, and getting tested or screened for diseases.  Follow your health care provider's instructions on monitoring your cholesterol and blood pressure. This information is not intended to replace advice given to you by your health care provider. Make sure you discuss any questions you have with your health care provider. Document Released: 12/18/2010 Document Revised: 05/28/2018 Document Reviewed: 05/28/2018 Elsevier Patient Education  2020 [employer]  

## 2019-04-04 LAB — COMPREHENSIVE METABOLIC PANEL
ALT: 9 IU/L (ref 0–32)
AST: 12 IU/L (ref 0–40)
Albumin/Globulin Ratio: 1.8 (ref 1.2–2.2)
Albumin: 4.4 g/dL (ref 3.8–4.8)
Alkaline Phosphatase: 57 IU/L (ref 39–117)
BUN/Creatinine Ratio: 18 (ref 9–23)
BUN: 14 mg/dL (ref 6–24)
Bilirubin Total: 0.2 mg/dL (ref 0.0–1.2)
CO2: 25 mmol/L (ref 20–29)
Calcium: 9.2 mg/dL (ref 8.7–10.2)
Chloride: 103 mmol/L (ref 96–106)
Creatinine, Ser: 0.78 mg/dL (ref 0.57–1.00)
GFR calc Af Amer: 110 mL/min/{1.73_m2} (ref >59)
GFR calc non Af Amer: 95 mL/min/{1.73_m2} (ref >59)
Globulin, Total: 2.4 g/dL (ref 1.5–4.5)
Glucose: 86 mg/dL (ref 65–99)
Potassium: 4.3 mmol/L (ref 3.5–5.2)
Sodium: 139 mmol/L (ref 134–144)
Total Protein: 6.8 g/dL (ref 6.0–8.5)

## 2019-04-04 LAB — HEMOGLOBIN A1C
Est. average glucose Bld gHb Est-mCnc: 111 mg/dL
Hgb A1c MFr Bld: 5.5 % (ref 4.8–5.6)

## 2019-04-04 LAB — CBC WITH DIFFERENTIAL/PLATELET
Basophils Absolute: 0 10*3/uL (ref 0.0–0.2)
Basos: 1 %
EOS (ABSOLUTE): 0.2 10*3/uL (ref 0.0–0.4)
Eos: 4 %
Hematocrit: 36.9 % (ref 34.0–46.6)
Hemoglobin: 11.7 g/dL (ref 11.1–15.9)
Immature Grans (Abs): 0 10*3/uL (ref 0.0–0.1)
Immature Granulocytes: 0 %
Lymphocytes Absolute: 1.4 10*3/uL (ref 0.7–3.1)
Lymphs: 33 %
MCH: 25.6 pg — ABNORMAL LOW (ref 26.6–33.0)
MCHC: 31.7 g/dL (ref 31.5–35.7)
MCV: 81 fL (ref 79–97)
Monocytes Absolute: 0.3 10*3/uL (ref 0.1–0.9)
Monocytes: 7 %
Neutrophils Absolute: 2.4 10*3/uL (ref 1.4–7.0)
Neutrophils: 55 %
Platelets: 325 10*3/uL (ref 150–450)
RBC: 4.57 x10E6/uL (ref 3.77–5.28)
RDW: 14.9 % (ref 11.7–15.4)
WBC: 4.3 10*3/uL (ref 3.4–10.8)

## 2019-04-04 LAB — LIPID PANEL
Chol/HDL Ratio: 3.8 ratio (ref 0.0–4.4)
Cholesterol, Total: 178 mg/dL (ref 100–199)
HDL: 47 mg/dL (ref >39)
LDL Chol Calc (NIH): 115 mg/dL — ABNORMAL HIGH (ref 0–99)
Triglycerides: 85 mg/dL (ref 0–149)
VLDL Cholesterol Cal: 16 mg/dL (ref 5–40)

## 2019-04-04 LAB — TSH: TSH: 1.74 u[IU]/mL (ref 0.450–4.500)

## 2019-04-06 ENCOUNTER — Telehealth: Payer: Self-pay | Admitting: Physician Assistant

## 2019-04-06 ENCOUNTER — Telehealth: Payer: Self-pay

## 2019-04-06 NOTE — Telephone Encounter (Signed)
-----   Message from Mar Daring, PA-C sent at 04/06/2019 11:11 AM EDT ----- Blood count is stable. Kidney and liver function are normal. Sodium, potassium and calcium are all normal. Thyroid is normal. Cholesterol has improved slightly from labs 2 years ago. Sugar/ A1c is normal.

## 2019-04-06 NOTE — Telephone Encounter (Signed)
lmtcb

## 2019-04-07 NOTE — Telephone Encounter (Signed)
Viewed by Cathren Laine on 04/06/2019 5:45 PM Written by Mar Daring, PA-C on 04/06/2019 11:11 AM Blood count is stable. Kidney and liver function are normal. Sodium, potassium and calcium are all normal. Thyroid is normal. Cholesterol has improved slightly from labs 2 years ago. Sugar/ A1c is normal.

## 2019-04-17 ENCOUNTER — Other Ambulatory Visit: Payer: Self-pay | Admitting: *Deleted

## 2019-04-17 MED ORDER — PHENAZOPYRIDINE HCL 200 MG PO TABS: 200.0000 mg | ORAL_TABLET | Freq: Three times a day (TID) | ORAL | 1 refills | Status: DC | PRN

## 2019-04-17 MED ORDER — NITROFURANTOIN MONOHYD MACRO 100 MG PO CAPS: 100.0000 mg | ORAL_CAPSULE | Freq: Two times a day (BID) | ORAL | 0 refills | Status: AC

## 2019-04-28 ENCOUNTER — Encounter (HOSPITAL_BASED_OUTPATIENT_CLINIC_OR_DEPARTMENT_OTHER): Payer: Self-pay | Admitting: *Deleted

## 2019-04-28 ENCOUNTER — Other Ambulatory Visit: Payer: Self-pay

## 2019-04-28 NOTE — H&P (Signed)
Tanya Meza is an 38 y.o. [phone] female.   Chief Complaint: abnormal bleeding HPI: Heavier cycles since last delivery. Underwent BTL at that time. Cycles are not regular and come q 2-3 months. They are heavy and cause vaginal irritation.  Past Medical History:  Diagnosis Date  . Complication of anesthesia   . GERD (gastroesophageal reflux disease)    with preg, on zantac  . Nephrolithiasis    no surgery required  . PCOS (polycystic ovarian syndrome)   . Tachycardia    RVOF    Past Surgical History:  Procedure Laterality Date  . CESAREAN SECTION  2005, 2008   x3  . CESAREAN SECTION WITH BILATERAL TUBAL LIGATION    . WISDOM TOOTH EXTRACTION      Family History  Problem Relation Age of Onset  . Thyroid disease Mother   . Hyperlipidemia Mother   . Hypothyroidism Mother   . Tongue cancer Mother   . Diabetes Father   . Hyperlipidemia Father   . Hypertension Brother   . Heart attack Maternal Grandfather   . Alcohol abuse Paternal Grandfather   . Heart attack Paternal Grandfather   . Hyperlipidemia Brother   . Anesthesia problems Neg Hx    Social History:  reports that she has never smoked. She has never used smokeless tobacco. She reports current alcohol use. She reports that she does not use drugs.  Allergies:  Allergies  Allergen Reactions  . Latex Swelling    Swelling- around catheter    No medications prior to admission.    Pertinent items are noted in HPI.  There were no vitals taken for this visit. General appearance: alert, cooperative and appears stated age Head: Normocephalic, without obvious abnormality, atraumatic Neck: supple, symmetrical, trachea midline Lungs: normal effort Heart: regular rate and rhythm Abdomen: soft, non-tender; bowel sounds normal; no masses,  no organomegaly Pulses: 2+ and symmetric Lymph nodes: Cervical, supraclavicular, and axillary nodes normal.   Lab Results  Component Value Date   WBC 4.3 04/03/2019   HGB 11.7  04/03/2019   HCT 36.9 04/03/2019   MCV 81 04/03/2019   PLT 325 04/03/2019   Lab Results  Component Value Date   PREGTESTUR NEGATIVE 03/07/2012     Assessment/Plan Principal Problem:   Menorrhagia with regular cycle  For D & C with Hysteroscopy and Hydrothermal ablation. Risks include but are not limited to bleeding, infection, injury to surrounding structures, including bowel, bladder and ureters, blood clots, and death.  Likelihood of success is high.    Donnamae Jude 04/28/2019, 9:15 AM

## 2019-05-02 ENCOUNTER — Other Ambulatory Visit (HOSPITAL_COMMUNITY)
Admission: RE | Admit: 2019-05-02 | Discharge: 2019-05-02 | Disposition: A | Discharge status: Home / Self Care | Payer: 59 | Source: Ambulatory Visit | Attending: Family Medicine | Admitting: Family Medicine

## 2019-05-02 DIAGNOSIS — Z01812 Encounter for preprocedural laboratory examination: Secondary | ICD-10-CM | POA: Diagnosis not present

## 2019-05-02 DIAGNOSIS — Z20828 Contact with and (suspected) exposure to other viral communicable diseases: Secondary | ICD-10-CM | POA: Insufficient documentation

## 2019-05-04 ENCOUNTER — Encounter (HOSPITAL_BASED_OUTPATIENT_CLINIC_OR_DEPARTMENT_OTHER)
Admission: RE | Admit: 2019-05-04 | Discharge: 2019-05-04 | Disposition: A | Discharge status: Home / Self Care | Payer: 59 | Source: Ambulatory Visit | Attending: Family Medicine | Admitting: Family Medicine

## 2019-05-04 ENCOUNTER — Other Ambulatory Visit: Payer: Self-pay

## 2019-05-04 DIAGNOSIS — Z01812 Encounter for preprocedural laboratory examination: Secondary | ICD-10-CM | POA: Insufficient documentation

## 2019-05-04 LAB — CBC
HCT: 38.7 % (ref 36.0–46.0)
Hemoglobin: 12.1 g/dL (ref 12.0–15.0)
MCH: 25.7 pg — ABNORMAL LOW (ref 26.0–34.0)
MCHC: 31.3 g/dL (ref 30.0–36.0)
MCV: 82.3 fL (ref 80.0–100.0)
Platelets: 385 10*3/uL (ref 150–400)
RBC: 4.7 MIL/uL (ref 3.87–5.11)
RDW: 15.4 % (ref 11.5–15.5)
WBC: 5.6 10*3/uL (ref 4.0–10.5)
nRBC: 0 % (ref 0.0–0.2)

## 2019-05-04 LAB — POCT PREGNANCY, URINE: Preg Test, Ur: NEGATIVE

## 2019-05-04 NOTE — Progress Notes (Signed)

## 2019-05-05 LAB — NOVEL CORONAVIRUS, NAA (HOSP ORDER, SEND-OUT TO REF LAB; TAT 18-24 HRS): SARS-CoV-2, NAA: NOT DETECTED

## 2019-05-06 ENCOUNTER — Ambulatory Visit (HOSPITAL_BASED_OUTPATIENT_CLINIC_OR_DEPARTMENT_OTHER)
Admission: RE | Admit: 2019-05-06 | Discharge: 2019-05-06 | Disposition: A | Discharge status: Home / Self Care | Payer: 59 | Attending: Family Medicine | Admitting: Family Medicine

## 2019-05-06 ENCOUNTER — Ambulatory Visit (HOSPITAL_BASED_OUTPATIENT_CLINIC_OR_DEPARTMENT_OTHER): Payer: 59 | Admitting: Certified Registered"

## 2019-05-06 ENCOUNTER — Encounter (HOSPITAL_BASED_OUTPATIENT_CLINIC_OR_DEPARTMENT_OTHER)
Admission: RE | Disposition: A | Discharge status: Home / Self Care | Payer: Self-pay | Source: Home / Self Care | Attending: Family Medicine

## 2019-05-06 ENCOUNTER — Other Ambulatory Visit: Payer: Self-pay

## 2019-05-06 ENCOUNTER — Encounter (HOSPITAL_BASED_OUTPATIENT_CLINIC_OR_DEPARTMENT_OTHER): Payer: Self-pay | Admitting: Certified Registered Nurse Anesthetist

## 2019-05-06 DIAGNOSIS — K219 Gastro-esophageal reflux disease without esophagitis: Secondary | ICD-10-CM | POA: Diagnosis not present

## 2019-05-06 DIAGNOSIS — N920 Excessive and frequent menstruation with regular cycle: Secondary | ICD-10-CM | POA: Diagnosis present

## 2019-05-06 HISTORY — PX: DILITATION & CURRETTAGE/HYSTROSCOPY WITH HYDROTHERMAL ABLATION: SHX5570

## 2019-05-06 SURGERY — DILATATION & CURETTAGE/HYSTEROSCOPY WITH HYDROTHERMAL ABLATION
Anesthesia: General

## 2019-05-06 MED ORDER — GABAPENTIN 300 MG PO CAPS
ORAL_CAPSULE | ORAL | Status: AC
  Filled 2019-05-06: qty 1

## 2019-05-06 MED ORDER — ONDANSETRON HCL 4 MG/2ML IJ SOLN
INTRAMUSCULAR | Status: AC
  Filled 2019-05-06: qty 2

## 2019-05-06 MED ORDER — ACETAMINOPHEN 500 MG PO TABS
1000.0000 mg | ORAL_TABLET | ORAL | Status: AC
  Administered 2019-05-06: 08:00:00 1000 mg via ORAL

## 2019-05-06 MED ORDER — HYDROMORPHONE HCL 1 MG/ML IJ SOLN
INTRAMUSCULAR | Status: AC
  Filled 2019-05-06: qty 0.5

## 2019-05-06 MED ORDER — MEPERIDINE HCL 25 MG/ML IJ SOLN: 6.2500 mg | INTRAMUSCULAR | Status: DC | PRN

## 2019-05-06 MED ORDER — LIDOCAINE 2% (20 MG/ML) 5 ML SYRINGE
INTRAMUSCULAR | Status: DC | PRN
  Administered 2019-05-06: 60 mg via INTRAVENOUS

## 2019-05-06 MED ORDER — LACTATED RINGERS IV SOLN
INTRAVENOUS | Status: DC
  Administered 2019-05-06: 10:00:00 via INTRAVENOUS

## 2019-05-06 MED ORDER — SODIUM CHLORIDE 0.9 % IR SOLN
Status: DC | PRN
  Administered 2019-05-06: 3000 mL

## 2019-05-06 MED ORDER — LIDOCAINE HCL 1 % IJ SOLN
INTRAMUSCULAR | Status: DC | PRN
  Administered 2019-05-06: 20 mL

## 2019-05-06 MED ORDER — MIDAZOLAM HCL 2 MG/2ML IJ SOLN
INTRAMUSCULAR | Status: AC
  Filled 2019-05-06: qty 2

## 2019-05-06 MED ORDER — LIDOCAINE HCL (PF) 1 % IJ SOLN
INTRAMUSCULAR | Status: AC
  Filled 2019-05-06: qty 30

## 2019-05-06 MED ORDER — PROPOFOL 500 MG/50ML IV EMUL
INTRAVENOUS | Status: AC
  Filled 2019-05-06: qty 50

## 2019-05-06 MED ORDER — FENTANYL CITRATE (PF) 100 MCG/2ML IJ SOLN: 25.0000 ug | INTRAMUSCULAR | Status: DC | PRN

## 2019-05-06 MED ORDER — HYDROMORPHONE HCL 1 MG/ML IJ SOLN
0.2500 mg | INTRAMUSCULAR | Status: DC | PRN
  Administered 2019-05-06 (×3): 0.5 mg via INTRAVENOUS

## 2019-05-06 MED ORDER — PROMETHAZINE HCL 25 MG/ML IJ SOLN: 6.2500 mg | INTRAMUSCULAR | Status: DC | PRN

## 2019-05-06 MED ORDER — ACETAMINOPHEN 500 MG PO TABS
ORAL_TABLET | ORAL | Status: AC
  Filled 2019-05-06: qty 2

## 2019-05-06 MED ORDER — DEXAMETHASONE SODIUM PHOSPHATE 10 MG/ML IJ SOLN
INTRAMUSCULAR | Status: DC | PRN
  Administered 2019-05-06: 10 mg via INTRAVENOUS

## 2019-05-06 MED ORDER — CELECOXIB 400 MG PO CAPS
400.0000 mg | ORAL_CAPSULE | ORAL | Status: AC
  Administered 2019-05-06: 08:00:00 400 mg via ORAL

## 2019-05-06 MED ORDER — LACTATED RINGERS IV SOLN
INTRAVENOUS | Status: DC
  Administered 2019-05-06: 08:00:00 via INTRAVENOUS

## 2019-05-06 MED ORDER — CELECOXIB 200 MG PO CAPS
ORAL_CAPSULE | ORAL | Status: AC
  Filled 2019-05-06: qty 2

## 2019-05-06 MED ORDER — MIDAZOLAM HCL 5 MG/5ML IJ SOLN
INTRAMUSCULAR | Status: DC | PRN
  Administered 2019-05-06: 2 mg via INTRAVENOUS

## 2019-05-06 MED ORDER — OXYCODONE HCL 5 MG PO TABS
ORAL_TABLET | ORAL | Status: AC
  Filled 2019-05-06: qty 1

## 2019-05-06 MED ORDER — FENTANYL CITRATE (PF) 100 MCG/2ML IJ SOLN
INTRAMUSCULAR | Status: DC | PRN
  Administered 2019-05-06 (×2): 25 ug via INTRAVENOUS
  Administered 2019-05-06: 50 ug via INTRAVENOUS

## 2019-05-06 MED ORDER — KETOROLAC TROMETHAMINE 30 MG/ML IJ SOLN: 30.0000 mg | Freq: Once | INTRAMUSCULAR | Status: DC | PRN

## 2019-05-06 MED ORDER — GABAPENTIN 300 MG PO CAPS
300.0000 mg | ORAL_CAPSULE | ORAL | Status: AC
  Administered 2019-05-06: 300 mg via ORAL

## 2019-05-06 MED ORDER — PROPOFOL 10 MG/ML IV BOLUS
INTRAVENOUS | Status: DC | PRN
  Administered 2019-05-06: 200 mg via INTRAVENOUS

## 2019-05-06 MED ORDER — OXYCODONE HCL 5 MG PO TABS
5.0000 mg | ORAL_TABLET | Freq: Once | ORAL | Status: AC | PRN
  Administered 2019-05-06: 5 mg via ORAL

## 2019-05-06 MED ORDER — ONDANSETRON HCL 4 MG/2ML IJ SOLN
INTRAMUSCULAR | Status: DC | PRN
  Administered 2019-05-06: 4 mg via INTRAVENOUS

## 2019-05-06 MED ORDER — DEXAMETHASONE SODIUM PHOSPHATE 10 MG/ML IJ SOLN
INTRAMUSCULAR | Status: AC
  Filled 2019-05-06: qty 1

## 2019-05-06 MED ORDER — OXYCODONE HCL 5 MG/5ML PO SOLN: 5.0000 mg | Freq: Once | ORAL | Status: AC | PRN

## 2019-05-06 MED ORDER — OXYCODONE HCL 5 MG PO TABS: 5.0000 mg | ORAL_TABLET | Freq: Four times a day (QID) | ORAL | 0 refills | Status: DC | PRN

## 2019-05-06 MED ORDER — FENTANYL CITRATE (PF) 100 MCG/2ML IJ SOLN
INTRAMUSCULAR | Status: AC
  Filled 2019-05-06: qty 2

## 2019-05-06 MED ORDER — LIDOCAINE 2% (20 MG/ML) 5 ML SYRINGE
INTRAMUSCULAR | Status: AC
  Filled 2019-05-06: qty 5

## 2019-05-06 SURGICAL SUPPLY — 16 items
BRIEF STRETCH FOR OB PAD XXL (UNDERPADS AND DIAPERS) ×3 IMPLANT
CANISTER SUCT 3000ML PPV (MISCELLANEOUS) ×3 IMPLANT
CONTAINER PREFILL 10% NBF 60ML (FORM) ×6 IMPLANT
DILATOR CANAL MILEX (MISCELLANEOUS) ×2 IMPLANT
GAUZE 4X4 16PLY RFD (DISPOSABLE) ×3 IMPLANT
GLOVE BIOGEL PI IND STRL 7.0 (GLOVE) ×2 IMPLANT
GLOVE BIOGEL PI INDICATOR 7.0 (GLOVE) ×4
GLOVE SURG SS PI 7.0 STRL IVOR (GLOVE) ×2 IMPLANT
GOWN STRL REUS W/TWL LRG LVL3 (GOWN DISPOSABLE) ×6 IMPLANT
PACK VAGINAL MINOR WOMEN LF (CUSTOM PROCEDURE TRAY) ×3 IMPLANT
PAD OB MATERNITY 4.3X12.25 (PERSONAL CARE ITEMS) ×3 IMPLANT
PAD PREP 24X48 CUFFED NSTRL (MISCELLANEOUS) ×3 IMPLANT
SET GENESYS HTA PROCERVA (MISCELLANEOUS) ×3 IMPLANT
SLEEVE SCD COMPRESS KNEE MED (MISCELLANEOUS) ×3 IMPLANT
TOWEL GREEN STERILE FF (TOWEL DISPOSABLE) ×6 IMPLANT
TRAY FOLEY W/BAG SLVR 14FR LF (SET/KITS/TRAYS/PACK) ×2 IMPLANT

## 2019-05-06 NOTE — Op Note (Signed)
Preoperative diagnosis: Menorrhagia  Postoperative diagnosis: Menorrhagia  Procedure: HTA endometrial ablation, hysteroscopy, D & C  Surgeon: Standley Dakins. Kennon Rounds, M.D.  Anesthesia: LMA Lynda Rainwater, MD  Findings: Normal appearing cervix and uterine cavity with blood clots due to her cycle  Estimated blood loss: Minimal  Specimens: Endometrial curettings  Disposition of specimen: Pathology  Reason for procedure: Patient is a 38 y.o. [id number] with heavy vaginal bleeding who desired treatment.   Procedure: Patient was taken to the OR where she was placed in dorsal lithotomy in [place]. SCDs were in place. An adequate timeout was obtained. The patient was prepped and draped in the usual sterile fashion. A speculum was placed inside the vagina and the cervix visualized. The cervix was grasped anteriorly with a single-tooth tenaculum. 20 cc of one percent Lidocaine were injected for paracervical block. Tan os finder used to locate the endocervical canal. Sequential dilation was done to a #21 dilator, and the HTA with hysteroscope was introduced into the uterine cavity. The cervix and endometrial lining appeared normal with one ostia seen there was no deformity of the cavity. There was adherent clot as she was on her cycle.  A seal test was done and was passed. The uterine cavity was heated to between 80 and 90C and HTA was performed for 10 minutes. Cooling was then performed and the instrument removed. Sharp curettage was then performed and sample sent to pathology. All instrument, needle, and lap counts were correct x 2. The patient was awakened taken to recovery room in stable condition.  Donnamae Jude MD 05/06/2019 9:15 AM

## 2019-05-06 NOTE — Anesthesia Preprocedure Evaluation (Signed)
Anesthesia Evaluation  Patient identified by MRN, date of birth, ID band Patient awake    Reviewed: Allergy & Precautions, H&P , NPO status , Patient's Chart, lab work & pertinent test results  Airway Mallampati: I  TM Distance: >3 FB Neck ROM: full    Dental no notable dental hx.    Pulmonary neg pulmonary ROS,    Pulmonary exam normal        Cardiovascular negative cardio ROS Normal cardiovascular exam     Neuro/Psych Anxiety negative neurological ROS  negative psych ROS   GI/Hepatic Neg liver ROS, GERD  Medicated and Controlled,  Endo/Other  negative endocrine ROS  Renal/GU negative Renal ROS  negative genitourinary   Musculoskeletal negative musculoskeletal ROS (+)   Abdominal Normal abdominal exam  (+)   Peds negative pediatric ROS (+)  Hematology negative hematology ROS (+)   Anesthesia Other Findings   Reproductive/Obstetrics                             Anesthesia Physical  Anesthesia Plan  ASA: II  Anesthesia Plan: General   Post-op Pain Management:    Induction: Intravenous  PONV Risk Score and Plan: 3 and Ondansetron, Dexamethasone, Midazolam and Treatment may vary due to age or medical condition  Airway Management Planned: LMA  Additional Equipment:   Intra-op Plan:   Post-operative Plan: Extubation in OR  Informed Consent: I have reviewed the patients History and Physical, chart, labs and discussed the procedure including the risks, benefits and alternatives for the proposed anesthesia with the patient or authorized representative who has indicated his/her understanding and acceptance.     Dental advisory given  Plan Discussed with: CRNA  Anesthesia Plan Comments:         Anesthesia Quick Evaluation

## 2019-05-06 NOTE — Interval H&P Note (Signed)
History and Physical Interval Note:  05/06/2019 7:20 AM  Tanya Meza  has presented today for surgery, with the diagnosis of ABNORMAL UTERINE BLEEDING.  The various methods of treatment have been discussed with the patient and family. After consideration of risks, benefits and other options for treatment, the patient has consented to  Procedure(s): DILATATION & CURETTAGE/HYSTEROSCOPY WITH HYDROTHERMAL ABLATION (N/A) as a surgical intervention.  The patient's history has been reviewed, patient examined, no change in status, stable for surgery.  I have reviewed the patient's chart and labs.  Questions were answered to the patient's satisfaction.     Donnamae Jude

## 2019-05-06 NOTE — Anesthesia Postprocedure Evaluation (Signed)
Anesthesia Post Note  Patient: Tanya Meza  Procedure(s) Performed: DILATATION & CURETTAGE/HYSTEROSCOPY WITH HYDROTHERMAL ABLATION (N/A )     Patient location during evaluation: PACU Anesthesia Type: General Level of consciousness: awake and alert Pain management: pain level controlled Vital Signs Assessment: post-procedure vital signs reviewed and stable Respiratory status: spontaneous breathing, nonlabored ventilation and respiratory function stable Cardiovascular status: blood pressure returned to baseline and stable Postop Assessment: no apparent nausea or vomiting Anesthetic complications: no    Last Vitals:  Vitals:   05/06/19 1000 05/06/19 1015  BP: (!) 99/58 (!) 94/55  Pulse: 68 64  Resp: 14 11  Temp:    SpO2: 98% 99%    Last Pain:  Vitals:   05/06/19 1015  TempSrc:   PainSc: [place]

## 2019-05-06 NOTE — Discharge Instructions (Signed)
Endometrial Ablation, Care After This sheet gives you information about how to care for yourself after your procedure. Your health care provider may also give you more specific instructions. If you have problems or questions, contact your health care provider. What can I expect after the procedure? After the procedure, it is common to have:  A need to urinate more frequently than usual for the first 24 hours.  Cramps similar to menstrual cramps. These may last for 1-2 days.  Thin, watery vaginal discharge that is light pink or brown in color. This may last a few weeks. Discharge will be heavy for the first few days after your procedure. You may need to wear a sanitary pad.  Nausea.  Vaginal bleeding for 4-6 weeks after the procedure, as tissue healing occurs. Follow these instructions at home: Activity  Do not drive for 24 hours if you were given amedicine to help you relax (sedative) during your procedure.  Do not have sex or put anything into your vagina until your health care provider approves.  Do not lift anything that is heavier than 10 lb (4.5 kg), or the limit that you are told, until your health care provider says that it is safe.  Return to your normal activities as told by your health care provider. Ask your health care provider what activities are safe for you. General instructions   Take over-the-counter and prescription medicines only as told by your health care provider.  Do not take baths, swim, or use a hot tub until your health care provider approves. You will be able to take showers.  Check your vaginal area every day for signs of infection. Check for: ? Redness, swelling, or pain. ? More discharge or blood, instead of less. ? Bad-smelling discharge.  Keep all follow-up visits as told by your health care provider. This is important.  Drink enough fluid to keep your urine pale yellow. Contact a health care provider if you have:  Vaginal redness, swelling, or  pain.  Vaginal discharge or bleeding that gets worse instead of getting better.  Bad-smelling vaginal discharge.  A fever or chills.  Trouble urinating. Get help right away if you have:  Heavy vaginal bleeding.  Severe cramps. Summary  After endometrial ablation, it is normal to have thin, watery vaginal discharge that is light pink or brown in color. This may last a few weeks and may be heavier right after the procedure.  Vaginal bleeding is also normal after the procedure and should get better with time.  Check your vaginal area every day for signs of infection, such as bad-smelling discharge.  Keep all follow-up visits as told by your health care provider. This is important. This information is not intended to replace advice given to you by your health care provider. Make sure you discuss any questions you have with your health care provider. Document Released: 04/16/2017 Document Revised: 09/25/2018 Document Reviewed: 04/16/2017 Elsevier Patient Education  [place].    No Tylenol until 2:00 PM on 05/06/2019. No Ibuprofen until 4:00 Pm on 05/06/2019.     Post Anesthesia Home Care Instructions  Activity: Get plenty of rest for the remainder of the day. A responsible individual must stay with you for 24 hours following the procedure.  For the next 24 hours, DO NOT: -Drive a car -Paediatric nurse -Drink alcoholic beverages -Take any medication unless instructed by your physician -Make any legal decisions or sign important papers.  Meals: Start with liquid foods such as gelatin or soup.  Progress to regular foods as tolerated. Avoid greasy, spicy, heavy foods. If nausea and/or vomiting occur, drink only clear liquids until the nausea and/or vomiting subsides. Call your physician if vomiting continues.  Special Instructions/Symptoms: Your throat may feel dry or sore from the anesthesia or the breathing tube placed in your throat during surgery. If this causes  discomfort, gargle with warm salt water. The discomfort should disappear within 24 hours.  If you had a scopolamine patch placed behind your ear for the management of post- operative nausea and/or vomiting:  1. The medication in the patch is effective for 72 hours, after which it should be removed.  Wrap patch in a tissue and discard in the trash. Wash hands thoroughly with soap and water. 2. You may remove the patch earlier than 72 hours if you experience unpleasant side effects which may include dry mouth, dizziness or visual disturbances. 3. Avoid touching the patch. Wash your hands with soap and water after contact with the patch.

## 2019-05-06 NOTE — Transfer of Care (Signed)
Immediate Anesthesia Transfer of Care Note  Patient: Tanya Meza  Procedure(s) Performed: DILATATION & CURETTAGE/HYSTEROSCOPY WITH HYDROTHERMAL ABLATION (N/A )  Patient Location: PACU  Anesthesia Type:General  Level of Consciousness: awake, alert  and oriented  Airway & Oxygen Therapy: Patient Spontanous Breathing and Patient connected to face mask oxygen  Post-op Assessment: Report given to RN and Post -op Vital signs reviewed and stable  Post vital signs: Reviewed and stable  Last Vitals:  Vitals Value Taken Time  BP 107/71   Temp    Pulse 75 05/06/19 0925  Resp 14 05/06/19 0925  SpO2 100 % 05/06/19 0925    Last Pain:  Vitals:   05/06/19 0735  TempSrc: Oral  PainSc: 0-No pain         Complications: No apparent anesthesia complications

## 2019-05-06 NOTE — Anesthesia Procedure Notes (Signed)
Procedure Name: LMA Insertion Date/Time: 05/06/2019 8:36 AM Performed by: Genelle Bal, CRNA Pre-anesthesia Checklist: Patient identified, Emergency Drugs available, Suction available and Patient being monitored Patient Re-evaluated:Patient Re-evaluated prior to induction Oxygen Delivery Method: Circle system utilized Preoxygenation: Pre-oxygenation with 100% oxygen Induction Type: IV induction Ventilation: Mask ventilation without difficulty LMA: LMA inserted LMA Size: 4.0 Number of attempts: 1 Airway Equipment and Method: Bite block Placement Confirmation: positive ETCO2 Tube secured with: Tape Dental Injury: Teeth and Oropharynx as per pre-operative assessment

## 2019-05-07 ENCOUNTER — Encounter (HOSPITAL_BASED_OUTPATIENT_CLINIC_OR_DEPARTMENT_OTHER): Payer: Self-pay | Admitting: Family Medicine

## 2019-05-07 LAB — SURGICAL PATHOLOGY

## 2019-05-29 ENCOUNTER — Ambulatory Visit
Admission: RE | Admit: 2019-05-29 | Discharge: 2019-05-29 | Disposition: A | Discharge status: Home / Self Care | Payer: 59 | Source: Ambulatory Visit | Attending: Physician Assistant | Admitting: Physician Assistant

## 2019-05-29 ENCOUNTER — Other Ambulatory Visit: Payer: Self-pay

## 2019-05-29 DIAGNOSIS — Z803 Family history of malignant neoplasm of breast: Secondary | ICD-10-CM

## 2019-05-29 DIAGNOSIS — Z1239 Encounter for other screening for malignant neoplasm of breast: Secondary | ICD-10-CM

## 2019-06-28 ENCOUNTER — Other Ambulatory Visit: Payer: Self-pay | Admitting: Physician Assistant

## 2019-06-28 DIAGNOSIS — K219 Gastro-esophageal reflux disease without esophagitis: Secondary | ICD-10-CM

## 2019-11-09 ENCOUNTER — Encounter: Payer: Self-pay | Admitting: Podiatry

## 2019-11-09 ENCOUNTER — Ambulatory Visit (INDEPENDENT_AMBULATORY_CARE_PROVIDER_SITE_OTHER): Payer: 59

## 2019-11-09 ENCOUNTER — Ambulatory Visit: Payer: 59 | Admitting: Podiatry

## 2019-11-09 ENCOUNTER — Other Ambulatory Visit: Payer: Self-pay

## 2019-11-09 ENCOUNTER — Ambulatory Visit: Payer: Self-pay | Admitting: Podiatry

## 2019-11-09 DIAGNOSIS — M2011 Hallux valgus (acquired), right foot: Secondary | ICD-10-CM | POA: Diagnosis not present

## 2019-11-09 DIAGNOSIS — M2012 Hallux valgus (acquired), left foot: Secondary | ICD-10-CM

## 2019-11-09 NOTE — Progress Notes (Signed)
Subjective:  Patient ID: Tanya Meza, female    DOB: [birth year],  MRN: [id number] HPI Chief Complaint  Patient presents with  . 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 New Patient (Initial Visit)    39 y.o. female presents with the above complaint.   ROS: Denies fever chills nausea vomiting muscle aches pains calf pain back pain chest pain shortness of breath.  Past Medical History:  Diagnosis Date  . Complication of anesthesia   . GERD (gastroesophageal reflux disease)    with preg, on zantac  . Nephrolithiasis    no surgery required  . PCOS (polycystic ovarian syndrome)   . Tachycardia    RVOF   Past Surgical History:  Procedure Laterality Date  . CESAREAN SECTION  2005, 2008   x3  . CESAREAN SECTION WITH BILATERAL TUBAL LIGATION    . DILITATION & CURRETTAGE/HYSTROSCOPY WITH HYDROTHERMAL ABLATION N/A 05/06/2019   Procedure: DILATATION & CURETTAGE/HYSTEROSCOPY WITH HYDROTHERMAL ABLATION;  Surgeon: Donnamae Jude, MD;  Location: [facility];  Service: Gynecology;  Laterality: N/A;  . WISDOM TOOTH EXTRACTION      Current Outpatient Medications:  .  omeprazole (PRILOSEC) 40 MG capsule, Take 1 capsule by mouth once daily, Disp: 90 capsule, Rfl: 0  Allergies  Allergen Reactions  . Latex Swelling    Swelling- around catheter   Review of Systems Objective:  There were no vitals filed for this visit.  General: Well developed, nourished, in no acute distress, alert and oriented x3   Dermatological: Skin is warm, dry and supple bilateral. Nails x 10 are well maintained; remaining integument appears unremarkable at this time. There are no open sores, no preulcerative lesions, no rash or signs of infection present.  Vascular: Dorsalis Pedis artery and Posterior Tibial artery pedal pulses are 2/4 bilateral with  immedate capillary fill time. Pedal hair growth present. No varicosities and no lower extremity edema present bilateral.   Neruologic: Grossly intact via light touch bilateral. Vibratory intact via tuning fork bilateral. Protective threshold with Semmes Wienstein monofilament intact to all pedal sites bilateral. Patellar and Achilles deep tendon reflexes 2+ bilateral. No Babinski or clonus noted bilateral.   Musculoskeletal: No gross boney pedal deformities bilateral. No pain, crepitus, or limitation noted with foot and ankle range of motion bilateral. Muscular strength 5/5 in all groups tested bilateral.  Mild bunion deformities bilateral left greater than right.  Mild tailor's bunion deformities.  No pain reproducible no crepitation  Gait: Unassisted, Nonantalgic.    Radiographs:  Radiographs taken today demonstrate an osseously mature individual with got good bone stock.  She is mildly elongated first metatarsals with some hypertrophic medial condyle but the first intermetatarsal angle was well within normal limits.  The joints appear to be just a bit tight when compared to the other joints however I do not see a lot of dorsal spurring at the first metatarsophalangeal joint.    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  Plan: Discussed the pros and cons of surgery and whether now is the time to do surgery.  At this point we decided not to do surgery because I just feel that they are not bad enough and they are not hurting her on a consistent enough basis.  So she feels better that she has been educated and  guided in this direction I will follow-up with her when she is ready for surgery or any type of foot ailment.     Denali Sharma T. [place], [place]

## 2020-03-01 ENCOUNTER — Ambulatory Visit: Payer: Self-pay | Admitting: Physician Assistant

## 2020-03-01 NOTE — Telephone Encounter (Signed)
Pt reports dizziness x 3 to 31/2 weeks. Spinning, positional, worse when turning head and going from lying to standing. . Mild-moderate, varies, resolves on own. Denies nausea, no visual changes. Does have "Slight headache behind right eye today." No cough or sinus issues, no headache. States checked BP, "Can't remember what it was but it was OK."  Unable to schedule in office visit with screening, dizziness. Pt has not been vaccinated, no known covid exposure. Please advise: 1st call: [phone]  2nd call [phone]  Care advise given, go to ED if symptoms worsen, severe headache, unilateral weakness. Pt verbalizes understanding.   Reason for Disposition . [1] MODERATE dizziness (e.g., vertigo; feels very unsteady, interferes with normal activities) AND [2] has NOT been evaluated by physician for this  Answer Assessment - Initial Assessment Questions 1. DESCRIPTION: "Describe your dizziness."     Spinning 2. VERTIGO: "Do you feel like either you or the room is spinning or tilting?"      yes 3. LIGHTHEADED: "Do you feel lightheaded?" (e.g., somewhat faint, woozy, weak upon standing)     yes 4. SEVERITY: "How bad is it?"  "Can you walk?"   - MILD: Feels unsteady but walking normally.   - MODERATE: Feels very unsteady when walking, but not falling; interferes with normal activities (e.g., school, work) .   - SEVERE: Unable to walk without falling, or requires assistance to walk without falling.     Mild  Moderate at times when turning head 5. ONSET:  "When did the dizziness begin?"     3.5 weeks ago 6. AGGRAVATING FACTORS: "Does anything make it worse?" (e.g., standing, change in head position)    Standing, position changes, turning head 7. CAUSE: "What do you think is causing the dizziness?"     Unsure maybe sinus 8. RECURRENT SYMPTOM: "Have you had dizziness before?" If Yes, ask: "When was the last time?" "What happened that time?"    no 9. OTHER SYMPTOMS: "Do you have any other  symptoms?" (e.g., headache, weakness, numbness, vomiting, earache)     Pressure headache behind right eye today, have had in past at times  Protocols used: DIZZINESS - VERTIGO-A-AH

## 2020-03-01 NOTE — Telephone Encounter (Signed)
Ok to schedule appt please.  If patient not safe to drive ok for virtual.

## 2020-03-02 NOTE — Progress Notes (Signed)
     Established patient visit   Patient: Tanya Meza   DOB: 14-Jul-1980   39 y.o. Female  MRN: [id number] Visit Date: 03/03/2020  Today's healthcare provider: Trey Sailors, PA-C   Chief Complaint  Patient presents with  . Dizziness   Subjective    Dizziness This is a new problem. The current episode started 1 to 4 weeks ago. The problem occurs daily. The problem has been unchanged. Associated symptoms include headaches (yesterday,03/02/2020 per pt) and vertigo. Pertinent negatives include no abdominal pain, congestion, numbness, visual change or weakness. The symptoms are aggravated by bending. She has tried relaxation, NSAIDs and rest for the symptoms.    She reports a few weeks ago she woke up in bed and turned her head, experienced room spinning. She did have some nausea but no vomiting. No falling. Infrequent room spinning spells. No new medications. No SOB, history of anemia.      Medications: Outpatient Medications Prior to Visit  Medication Sig  . omeprazole (PRILOSEC) 40 MG capsule Take 1 capsule by mouth once daily   No facility-administered medications prior to visit.    Review of Systems  Constitutional: Negative.   HENT: Negative for congestion.   Respiratory: Negative.   Gastrointestinal: Negative for abdominal pain.  Neurological: Positive for dizziness, vertigo and headaches (yesterday,03/02/2020 per pt). Negative for weakness and numbness.      Objective    BP (!) 129/57 (BP Location: Right Arm, Patient Position: Sitting, Cuff Size: Normal)   Pulse 73   Temp 98 F (36.7 C) (Oral)   Wt 196 lb 9.6 oz (89.2 kg)   SpO2 99%   BMI 32.72 kg/m    Physical Exam Constitutional:      Appearance: Normal appearance.  Eyes:     Extraocular Movements: Extraocular movements intact.     Conjunctiva/sclera: Conjunctivae normal.     Pupils: Pupils are equal, round, and reactive to light.  Cardiovascular:     Rate and Rhythm: Normal rate and regular  rhythm.     Heart sounds: Normal heart sounds.  Pulmonary:     Effort: Pulmonary effort is normal.     Breath sounds: Normal breath sounds.  Skin:    General: Skin is warm and dry.  Neurological:     Mental Status: She is alert and oriented to person, place, and time. Mental status is at baseline.  Psychiatric:        Mood and Affect: Mood normal.        Behavior: Behavior normal.       No results found for any visits on 03/03/20.  Assessment & Plan     1. Dizziness  EKG normal, consistent with vertigo. Counseled on Epley maneuver and meclizine PRN.   - EKG 12-Lead   Return if symptoms worsen or fail to improve.      ITrey Sailors, PA-C, have reviewed all documentation for this visit. The documentation on 03/03/20 for the exam, diagnosis, procedures, and orders are all accurate and complete.  The entirety of the information documented in the History of Present Illness, Review of Systems and Physical Exam were personally obtained by me. Portions of this information were initially documented by [facility] and reviewed by me for thoroughness and accuracy.     Tanya Meza  [facility] [phone] (phone) [phone] (fax)  [facility]

## 2020-03-02 NOTE — Telephone Encounter (Signed)
Noted pt scheduled for tomorrow 03/03/2020

## 2020-03-02 NOTE — Telephone Encounter (Signed)
LMTCB-if patient calls back ok for [facility] Nurse to schedule appt for dizziness please. If patient not safe to drive ok for Virtual.

## 2020-03-03 ENCOUNTER — Telehealth: Payer: 59 | Admitting: Physician Assistant

## 2020-03-03 ENCOUNTER — Ambulatory Visit: Payer: 59 | Admitting: Physician Assistant

## 2020-03-03 ENCOUNTER — Other Ambulatory Visit: Payer: Self-pay

## 2020-03-03 ENCOUNTER — Encounter: Payer: Self-pay | Admitting: Physician Assistant

## 2020-03-03 VITALS — BP 129/57 | HR 73 | Temp 98.0°F | Wt 196.6 lb

## 2020-03-03 DIAGNOSIS — R42 Dizziness and giddiness: Secondary | ICD-10-CM

## 2020-03-03 NOTE — Patient Instructions (Signed)
Meclizine - for vertigo    How to Perform the Epley Maneuver The Epley maneuver is an exercise that relieves symptoms of vertigo. Vertigo is the feeling that you or your surroundings are moving when they are not. When you feel vertigo, you may feel like the room is spinning and have trouble walking. Dizziness is a little different than vertigo. When you are dizzy, you may feel unsteady or light-headed. You can do this maneuver at home whenever you have symptoms of vertigo. You can do it up to 3 times a day until your symptoms go away. Even though the Epley maneuver may relieve your vertigo for a few weeks, it is possible that your symptoms will return. This maneuver relieves vertigo, but it does not relieve dizziness. What are the risks? If it is done correctly, the Epley maneuver is considered safe. Sometimes it can lead to dizziness or nausea that goes away after a short time. If you develop other symptoms, such as changes in vision, weakness, or numbness, stop doing the maneuver and call your health care provider. How to perform the Epley maneuver 1. Sit on the edge of a bed or table with your back straight and your legs extended or hanging over the edge of the bed or table. 2. Turn your head halfway toward the affected ear or side. 3. Lie backward quickly with your head turned until you are lying flat on your back. You may want to position a pillow under your shoulders. 4. Hold this position for 30 seconds. You may experience an attack of vertigo. This is normal. 5. Turn your head to the opposite direction until your unaffected ear is facing the floor. 6. Hold this position for 30 seconds. You may experience an attack of vertigo. This is normal. Hold this position until the vertigo stops. 7. Turn your whole body to the same side as your head. Hold for another 30 seconds. 8. Sit back up. You can repeat this exercise up to 3 times a day. Follow these instructions at home:  After doing the  Epley maneuver, you can return to your normal activities.  Ask your health care provider if there is anything you should do at home to prevent vertigo. He or she may recommend that you: ? Keep your head raised (elevated) with two or more pillows while you sleep. ? Do not sleep on the side of your affected ear. ? Get up slowly from bed. ? Avoid sudden movements during the day. ? Avoid extreme head movement, like looking up or bending over. Contact a health care provider if:  Your vertigo gets worse.  You have other symptoms, including: ? Nausea. ? Vomiting. ? Headache. Get help right away if:  You have vision changes.  You have a severe or worsening headache or neck pain.  You cannot stop vomiting.  You have new numbness or weakness in any part of your body. Summary  Vertigo is the feeling that you or your surroundings are moving when they are not.  The Epley maneuver is an exercise that relieves symptoms of vertigo.  If the Epley maneuver is done correctly, it is considered safe. You can do it up to 3 times a day. This information is not intended to replace advice given to you by your health care provider. Make sure you discuss any questions you have with your health care provider. Document Revised: 05/17/2017 Document Reviewed: 04/24/2016 Elsevier Patient Education  2020 [employer].

## 2020-04-06 NOTE — Progress Notes (Deleted)
Complete physical exam   Patient: Tanya Meza   DOB: [birth year]   39 y.o. Female  MRN: [id number] Visit Date: 04/08/2020  Today's healthcare provider: Margaretann Loveless, PA-C   No chief complaint on file.  Subjective    Tanya Meza is a 39 y.o. female who presents today for a complete physical exam.  She reports consuming a {diet types:17450} diet. {Exercise:19826} She generally feels {well/fairly well/poorly:18703}. She reports sleeping {well/fairly well/poorly:18703}. She {does/does not:200015} have additional problems to discuss today.  HPI  Pap:12/30/18-Normal-by GYN  Past Medical History:  Diagnosis Date  . Complication of anesthesia   . GERD (gastroesophageal reflux disease)    with preg, on zantac  . Nephrolithiasis    no surgery required  . PCOS (polycystic ovarian syndrome)   . Tachycardia    RVOF   Past Surgical History:  Procedure Laterality Date  . CESAREAN SECTION  2005, 2008   x3  . CESAREAN SECTION WITH BILATERAL TUBAL LIGATION    . DILITATION & CURRETTAGE/HYSTROSCOPY WITH HYDROTHERMAL ABLATION N/A 05/06/2019   Procedure: DILATATION & CURETTAGE/HYSTEROSCOPY WITH HYDROTHERMAL ABLATION;  Surgeon: Reva Bores, MD;  Location: [facility];  Service: Gynecology;  Laterality: N/A;  . WISDOM TOOTH EXTRACTION     Social History   Socioeconomic History  . Marital status: Married    Spouse name: Not on file  . Number of children: 3  . Years of education: Not on file  . Highest education level: Not on file  Occupational History    Employer: [employer]  Tobacco Use  . Smoking status: Never Smoker  . Smokeless tobacco: Never Used  Vaping Use  . Vaping Use: Never used  Substance and Sexual Activity  . Alcohol use: Yes    Comment: social  . Drug use: No  . Sexual activity: Yes    Partners: Male    Birth control/protection: Surgical    Comment: tubal ligation.  Other Topics Concern  . Not on file  Social History Narrative  . Not  on file   Social Determinants of Health   Financial Resource Strain:   . Difficulty of Paying Living Expenses: Not on file  Food Insecurity:   . Worried About Programme researcher, broadcasting/film/video in the Last Year: Not on file  . Ran Out of Food in the Last Year: Not on file  Transportation Needs:   . Lack of Transportation (Medical): Not on file  . Lack of Transportation (Non-Medical): Not on file  Physical Activity:   . Days of Exercise per Week: Not on file  . Minutes of Exercise per Session: Not on file  Stress:   . Feeling of Stress : Not on file  Social Connections:   . Frequency of Communication with Friends and Family: Not on file  . Frequency of Social Gatherings with Friends and Family: Not on file  . Attends Religious Services: Not on file  . Active Member of Clubs or Organizations: Not on file  . Attends Banker Meetings: Not on file  . Marital Status: Not on file  Intimate Partner Violence:   . Fear of Current or Ex-Partner: Not on file  . Emotionally Abused: Not on file  . Physically Abused: Not on file  . Sexually Abused: Not on file   Family Status  Relation Name Status  . Mother  Alive  . Father  Alive  . Brother 1 Alive  . MGF  Deceased  . PGF  Deceased  .  Brother 2 Alive  . Brother 3 Alive  . Daughter  Alive  . Son  Alive  . Daughter  Alive  . Mat Aunt  (Not Specified)  . Neg Hx  (Not Specified)   Family History  Problem Relation Age of Onset  . Thyroid disease Mother   . Hyperlipidemia Mother   . Hypothyroidism Mother   . Tongue cancer Mother   . Diabetes Father   . Hyperlipidemia Father   . Hypertension Brother   . Heart attack Maternal Grandfather   . Alcohol abuse Paternal Grandfather   . Heart attack Paternal Grandfather   . Hyperlipidemia Brother   . Breast cancer Maternal Aunt   . Anesthesia problems Neg Hx    Allergies  Allergen Reactions  . Latex Swelling    Swelling- around catheter    Patient Care Team: Reine Just as PCP - General (Physician Assistant)   Medications: Outpatient Medications Prior to Visit  Medication Sig  . omeprazole (PRILOSEC) 40 MG capsule Take 1 capsule by mouth once daily   No facility-administered medications prior to visit.    Review of Systems  Constitutional: Negative.   HENT: Negative.   Eyes: Negative.   Respiratory: Negative.   Cardiovascular: Negative.   Gastrointestinal: Negative.   Endocrine: Negative.   Genitourinary: Negative.   Musculoskeletal: Negative.   Skin: Negative.   Allergic/Immunologic: Negative.   Neurological: Negative.   Hematological: Negative.   Psychiatric/Behavioral: Negative.     {Heme  Chem  Endocrine  Serology  Results Review (optional):23779::" "}  Objective    There were no vitals taken for this visit. {Show previous vital signs (optional):23777::" "}  Physical Exam  ***  Last depression screening scores PHQ 2/9 Scores 03/03/2020 04/03/2019 08/24/2016  PHQ - 2 Score 0 0 2  PHQ- 9 Score 0 0 5   Last fall risk screening Fall Risk  03/03/2020  Falls in the past year? 0  Number falls in past yr: 0  Injury with Fall? 0  Risk for fall due to : No Fall Risks  Follow up Falls evaluation completed   Last Audit-C alcohol use screening Alcohol Use Disorder Test (AUDIT) 03/03/2020  1. How often do you have a drink containing alcohol? 2  2. How many drinks containing alcohol do you have on a typical day when you are drinking? 0  3. How often do you have six or more drinks on one occasion? 0  AUDIT-C Score 2   A score of 3 or more in women, and 4 or more in men indicates increased risk for alcohol abuse, EXCEPT if all of the points are from question 1   No results found for any visits on 04/08/20.  Assessment & Plan    Routine Health Maintenance and Physical Exam  Exercise Activities and Dietary recommendations Goals    . Exercise 150 minutes per week (moderate activity)       Immunization History  Administered  Date(s) Administered  . Influenza Split 04/24/2011, 08/05/2012  . Influenza,inj,Quad PF,6+ Mos 04/02/2017, 04/02/2018, 03/04/2019  . PPD Test 07/23/2011  . Tdap 05/16/2011    Health Maintenance  Topic Date Due  . Hepatitis C Screening  Never done  . COVID-19 Vaccine (1) Never done  . INFLUENZA VACCINE  01/17/2020  . TETANUS/TDAP  05/15/2021  . PAP SMEAR-Modifier  12/29/2021  . HIV Screening  Completed    Discussed health benefits of physical activity, and encouraged her to engage in regular exercise appropriate for  her age and condition.  ***  No follow-ups on file.     {provider attestation***:1}   Reine Just  [facility] [phone] (phone) [phone] (fax)  [facility]

## 2020-04-08 ENCOUNTER — Encounter: Payer: Self-pay | Admitting: Physician Assistant

## 2020-05-03 ENCOUNTER — Ambulatory Visit: Payer: 59 | Admitting: Family Medicine

## 2020-05-20 ENCOUNTER — Encounter: Payer: Self-pay | Admitting: Physician Assistant

## 2020-06-21 ENCOUNTER — Ambulatory Visit: Payer: 59 | Admitting: Family Medicine

## 2020-07-01 ENCOUNTER — Encounter: Payer: Self-pay | Admitting: Physician Assistant

## 2020-07-07 ENCOUNTER — Ambulatory Visit: Payer: 59 | Admitting: Family Medicine

## 2020-07-11 IMAGING — MG DIGITAL SCREENING BILAT W/ TOMO W/ CAD
7 series · 8 of 23 positions shown · non-contrast
Comparison: None.

CLINICAL DATA: Screening.

EXAM:
DIGITAL SCREENING BILATERAL MAMMOGRAM WITH TOMO AND CAD

[R CC synth-2D]
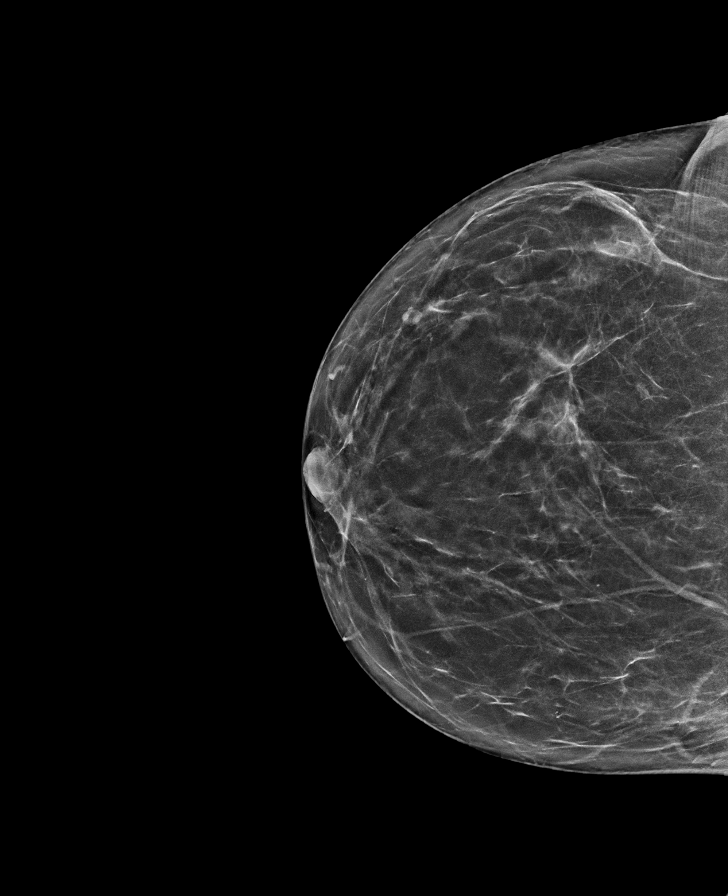

[R MLO synth-2D]
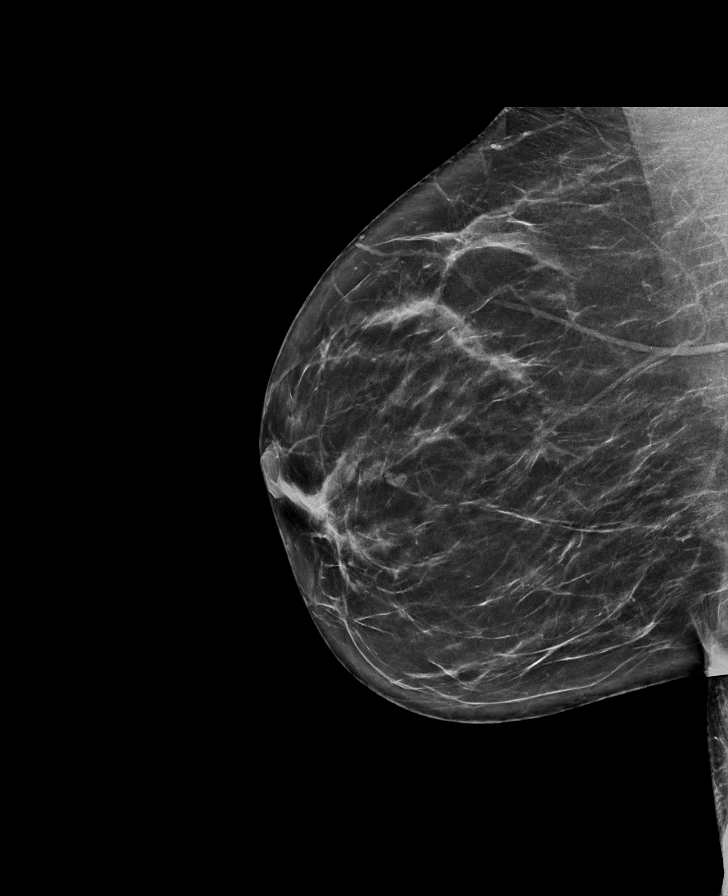

[L MLO synth-2D]
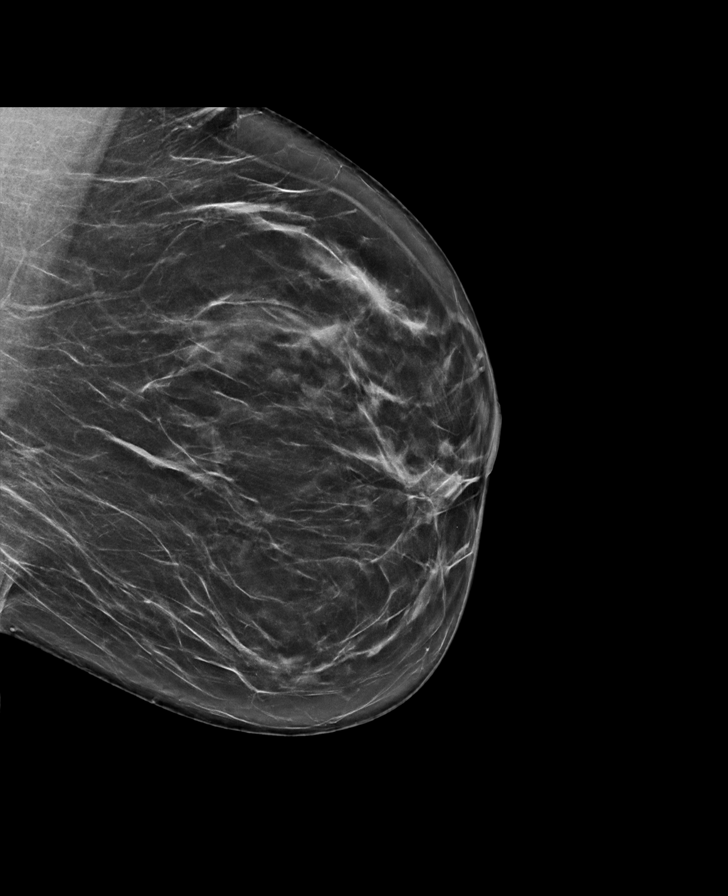

[R MLO tomo · 2 of 70 frames shown]
[frame 23/70]
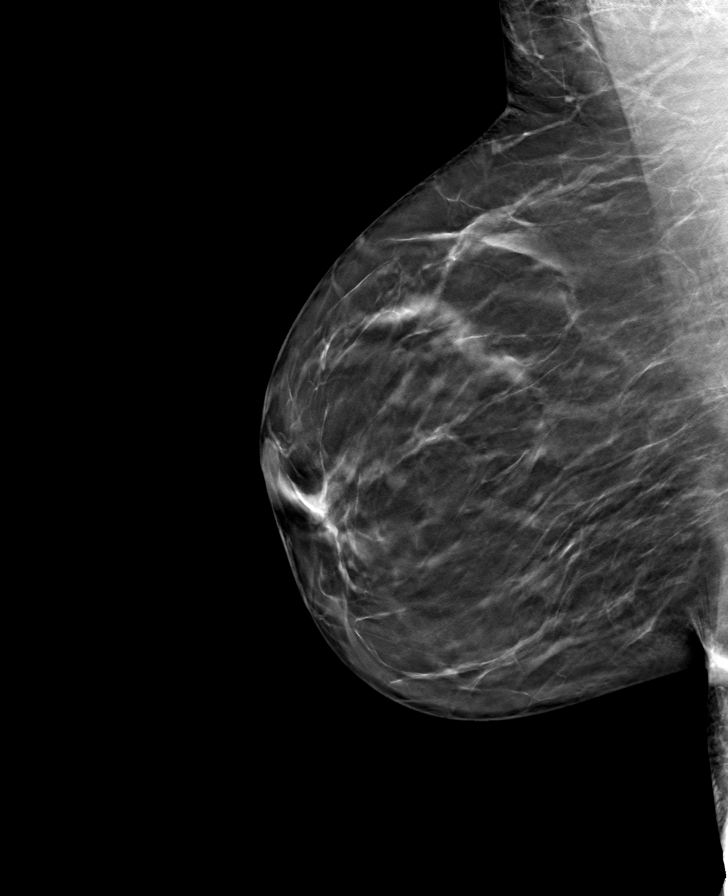
[frame 35/70]
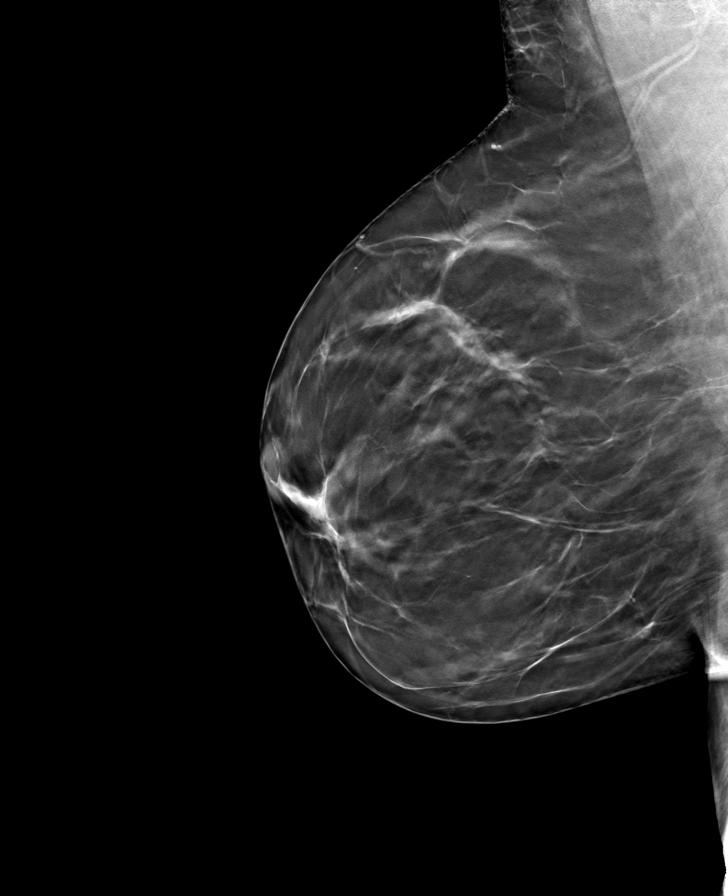

[L CC tomo · tomo slice 35/68.0]
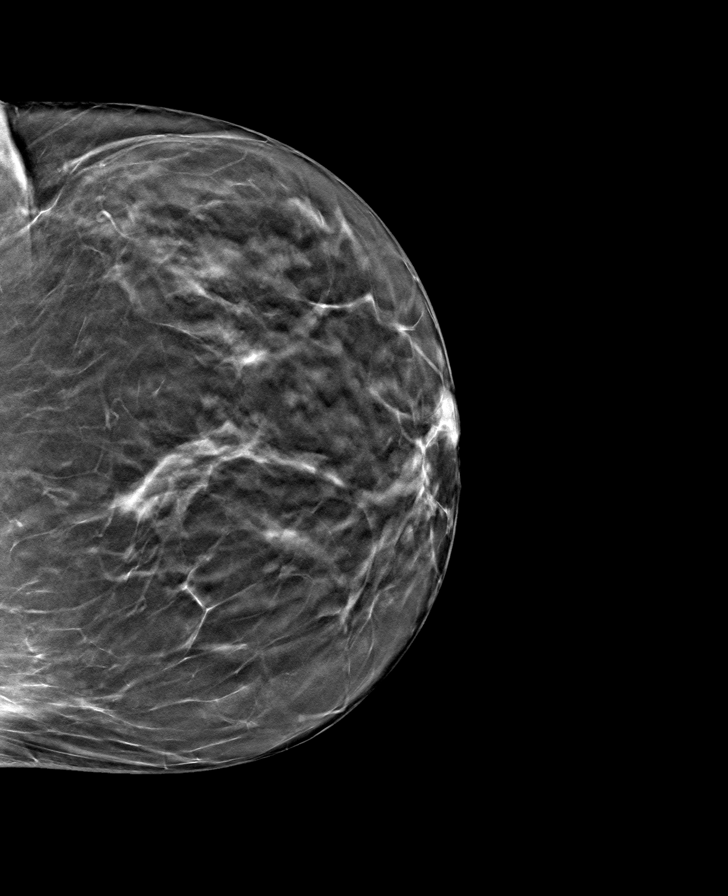

[L MLO tomo · tomo slice 39/77.0]
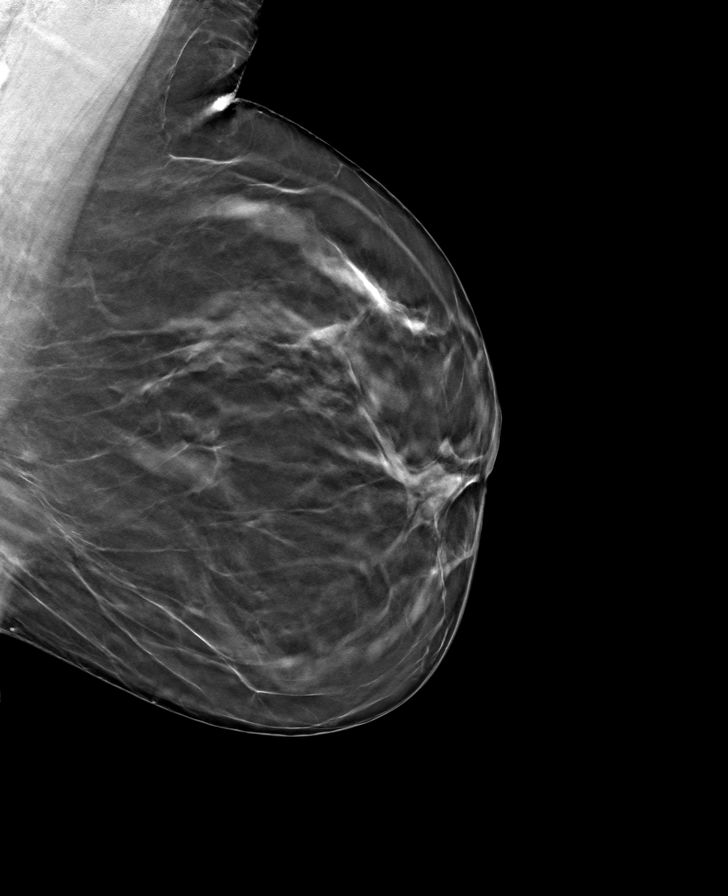

[R CC tomo · tomo slice 33/66.0]
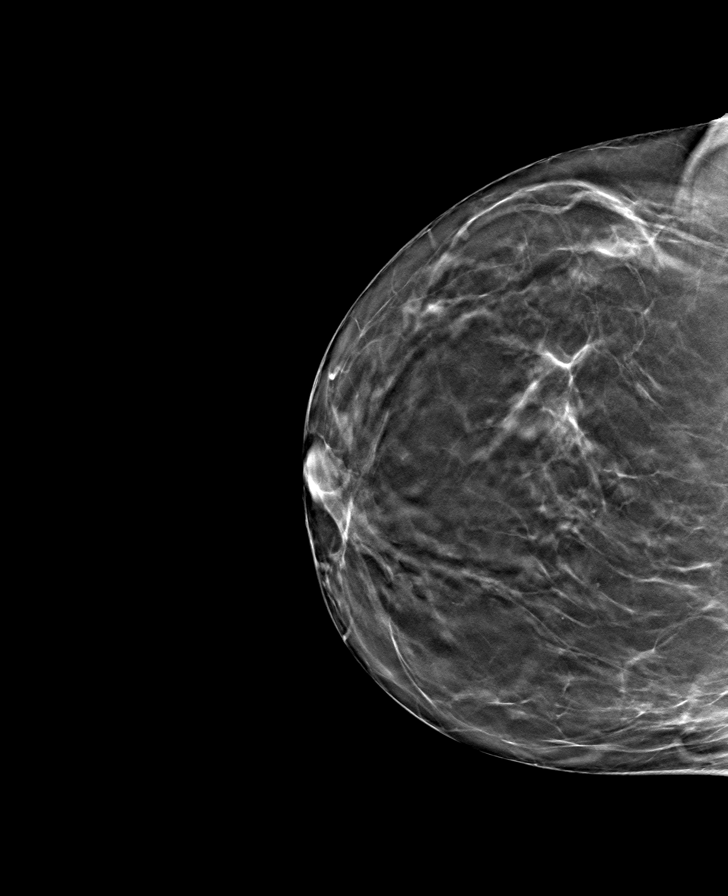

[8 of 23 positions shown; findings below may reference images not displayed]

ACR Breast Density Category b: There are scattered areas of
fibroglandular density.
FINDINGS: There are no findings suspicious for malignancy. Images were
processed with CAD.
IMPRESSION: No mammographic evidence of malignancy. A result letter of this
screening mammogram will be mailed directly to the patient.

RECOMMENDATION:
Screening mammogram in one year. (Code:Y5-G-EJ6)

BI-RADS CATEGORY  1: Negative.

## 2020-09-09 ENCOUNTER — Other Ambulatory Visit: Payer: Self-pay

## 2020-09-09 ENCOUNTER — Ambulatory Visit (INDEPENDENT_AMBULATORY_CARE_PROVIDER_SITE_OTHER): Payer: 59 | Admitting: Physician Assistant

## 2020-09-09 ENCOUNTER — Encounter: Payer: Self-pay | Admitting: Physician Assistant

## 2020-09-09 VITALS — BP 108/77 | HR 69 | Temp 97.9°F | Ht 65.0 in | Wt 199.0 lb

## 2020-09-09 DIAGNOSIS — K219 Gastro-esophageal reflux disease without esophagitis: Secondary | ICD-10-CM | POA: Diagnosis not present

## 2020-09-09 DIAGNOSIS — Z0000 Encounter for general adult medical examination without abnormal findings: Secondary | ICD-10-CM | POA: Diagnosis not present

## 2020-09-09 DIAGNOSIS — E6609 Other obesity due to excess calories: Secondary | ICD-10-CM

## 2020-09-09 DIAGNOSIS — R102 Pelvic and perineal pain: Secondary | ICD-10-CM

## 2020-09-09 DIAGNOSIS — E282 Polycystic ovarian syndrome: Secondary | ICD-10-CM | POA: Diagnosis not present

## 2020-09-09 DIAGNOSIS — Z1239 Encounter for other screening for malignant neoplasm of breast: Secondary | ICD-10-CM

## 2020-09-09 DIAGNOSIS — Z6833 Body mass index (BMI) 33.0-33.9, adult: Secondary | ICD-10-CM

## 2020-09-09 LAB — POCT URINALYSIS DIPSTICK
Bilirubin, UA: NEGATIVE
Blood, UA: NEGATIVE
Glucose, UA: NEGATIVE
Ketones, UA: NEGATIVE
Leukocytes, UA: NEGATIVE
Nitrite, UA: NEGATIVE
Protein, UA: NEGATIVE
Spec Grav, UA: 1.02 (ref 1.010–1.025)
Urobilinogen, UA: 0.2 E.U./dL
pH, UA: 6 (ref 5.0–8.0)

## 2020-09-09 LAB — POCT URINE PREGNANCY: Preg Test, Ur: NEGATIVE

## 2020-09-09 MED ORDER — OMEPRAZOLE 40 MG PO CPDR: 40.0000 mg | DELAYED_RELEASE_CAPSULE | Freq: Every day | ORAL | 1 refills | Status: DC

## 2020-09-09 NOTE — Progress Notes (Signed)
Complete physical exam   Patient: Tanya Meza   DOB: 1981/02/23   40 y.o. Female  MRN: [id number] Visit Date: 09/09/2020  Today's healthcare provider: Margaretann Loveless, PA-C   Chief Complaint  Patient presents with  . Annual Exam   Subjective    Tanya Meza is a 40 y.o. female who presents today for a complete physical exam.  She reports consuming a general diet. The patient does not participate in regular exercise at present. She generally feels fairly well. She reports sleeping well. She does have additional problems to discuss today.  HPI  Lower abdominal pain.  Started a few days ago.  Feels may be ovarian cyst.  Past Medical History:  Diagnosis Date  . Complication of anesthesia   . GERD (gastroesophageal reflux disease)    with preg, on zantac  . Nephrolithiasis    no surgery required  . PCOS (polycystic ovarian syndrome)   . Tachycardia    RVOF   Past Surgical History:  Procedure Laterality Date  . CESAREAN SECTION  2005, 2008   x3  . CESAREAN SECTION WITH BILATERAL TUBAL LIGATION    . DILITATION & CURRETTAGE/HYSTROSCOPY WITH HYDROTHERMAL ABLATION N/A 05/06/2019   Procedure: DILATATION & CURETTAGE/HYSTEROSCOPY WITH HYDROTHERMAL ABLATION;  Surgeon: Reva Bores, MD;  Location: [facility];  Service: Gynecology;  Laterality: N/A;  . WISDOM TOOTH EXTRACTION     Social History   Socioeconomic History  . Marital status: Married    Spouse name: Not on file  . Number of children: 3  . Years of education: Not on file  . Highest education level: Not on file  Occupational History    Employer: [employer]  Tobacco Use  . Smoking status: Never Smoker  . Smokeless tobacco: Never Used  Vaping Use  . Vaping Use: Never used  Substance and Sexual Activity  . Alcohol use: Yes    Comment: social  . Drug use: No  . Sexual activity: Yes    Partners: Male    Birth control/protection: Surgical    Comment: tubal ligation.  Other Topics Concern   . Not on file  Social History Narrative  . Not on file   Social Determinants of Health   Financial Resource Strain: Not on file  Food Insecurity: Not on file  Transportation Needs: Not on file  Physical Activity: Not on file  Stress: Not on file  Social Connections: Not on file  Intimate Partner Violence: Not on file   Family Status  Relation Name Status  . Mother  Alive  . Father  Alive  . Brother 1 Alive  . MGF  Deceased  . PGF  Deceased  . Brother 2 Alive  . Brother 3 Alive  . Daughter  Alive  . Son  Alive  . Daughter  Alive  . Mat Aunt  (Not Specified)  . Neg Hx  (Not Specified)   Family History  Problem Relation Age of Onset  . Thyroid disease Mother   . Hyperlipidemia Mother   . Hypothyroidism Mother   . Tongue cancer Mother   . Diabetes Father   . Hyperlipidemia Father   . Hypertension Brother   . Heart attack Maternal Grandfather   . Alcohol abuse Paternal Grandfather   . Heart attack Paternal Grandfather   . Hyperlipidemia Brother   . Breast cancer Maternal Aunt   . Anesthesia problems Neg Hx   . Colon cancer Neg Hx    Allergies  Allergen  Reactions  . Latex Swelling    Swelling- around catheter    Patient Care Team: Reine JustBurnette, Vaden Becherer M, PA-C as PCP - General (Physician Assistant)   Medications: Outpatient Medications Prior to Visit  Medication Sig  . [DISCONTINUED] omeprazole (PRILOSEC) 40 MG capsule Take 1 capsule by mouth once daily   No facility-administered medications prior to visit.    Review of Systems  Constitutional: Negative.   HENT: Negative.   Eyes: Negative.   Respiratory: Negative.   Cardiovascular: Negative.   Gastrointestinal: Positive for abdominal distention. Negative for abdominal pain, anal bleeding, blood in stool, constipation, diarrhea, nausea, rectal pain and vomiting.  Endocrine: Negative.   Genitourinary: Positive for pelvic pain.  Musculoskeletal: Negative.   Skin: Negative.   Allergic/Immunologic:  Negative.   Neurological: Negative.   Hematological: Negative.   Psychiatric/Behavioral: Negative.       Objective    BP 108/77 (BP Location: Left Arm, Patient Position: Sitting, Cuff Size: Large)   Pulse 69   Temp 97.9 F (36.6 C) (Oral)   Ht 5\' 5"  (1.651 m)   Wt 199 lb (90.3 kg)   BMI 33.12 kg/m    Physical Exam Vitals reviewed.  Constitutional:      General: She is not in acute distress.    Appearance: Normal appearance. She is well-developed. She is obese. She is not ill-appearing or diaphoretic.  HENT:     Head: Normocephalic and atraumatic.     Right Ear: Tympanic membrane, ear canal and external ear normal.     Left Ear: Tympanic membrane, ear canal and external ear normal.     Nose: Nose normal.     Mouth/Throat:     Mouth: Mucous membranes are moist.     Pharynx: Oropharynx is clear. No oropharyngeal exudate or posterior oropharyngeal erythema.  Eyes:     General: No scleral icterus.       Right eye: No discharge.        Left eye: No discharge.     Extraocular Movements: Extraocular movements intact.     Conjunctiva/sclera: Conjunctivae normal.     Pupils: Pupils are equal, round, and reactive to light.  Neck:     Thyroid: No thyromegaly.     Vascular: No JVD.     Trachea: No tracheal deviation.  Cardiovascular:     Rate and Rhythm: Normal rate and regular rhythm.     Pulses: Normal pulses.     Heart sounds: Normal heart sounds. No murmur heard. No friction rub. No gallop.   Pulmonary:     Effort: Pulmonary effort is normal. No respiratory distress.     Breath sounds: Normal breath sounds. No wheezing or rales.  Chest:     Chest wall: No tenderness.  Abdominal:     General: Abdomen is flat. Bowel sounds are normal. There is no distension.     Palpations: Abdomen is soft. There is no mass.     Tenderness: There is abdominal tenderness in the suprapubic area. There is no guarding or rebound.  Musculoskeletal:        General: No tenderness. Normal range  of motion.     Cervical back: Normal range of motion and neck supple. No tenderness.     Right lower leg: No edema.     Left lower leg: No edema.  Lymphadenopathy:     Cervical: No cervical adenopathy.  Skin:    General: Skin is warm and dry.     Capillary Refill: Capillary refill takes less than  2 seconds.     Findings: No rash.  Neurological:     General: No focal deficit present.     Mental Status: She is alert and oriented to person, place, and time. Mental status is at baseline.  Psychiatric:        Mood and Affect: Mood normal.        Behavior: Behavior normal.        Thought Content: Thought content normal.        Judgment: Judgment normal.     Last depression screening scores PHQ 2/9 Scores 09/09/2020 03/03/2020 04/03/2019  PHQ - 2 Score 0 0 0  PHQ- 9 Score 0 0 0   Last fall risk screening Fall Risk  09/09/2020  Falls in the past year? 0  Number falls in past yr: 0  Injury with Fall? 0  Risk for fall due to : No Fall Risks  Follow up Falls evaluation completed   Last Audit-C alcohol use screening Alcohol Use Disorder Test (AUDIT) 09/09/2020  1. How often do you have a drink containing alcohol? 2  2. How many drinks containing alcohol do you have on a typical day when you are drinking? 0  3. How often do you have six or more drinks on one occasion? 0  AUDIT-C Score 2  Alcohol Brief Interventions/Follow-up AUDIT Score <7 follow-up not indicated   A score of 3 or more in women, and 4 or more in men indicates increased risk for alcohol abuse, EXCEPT if all of the points are from question 1   No results found for any visits on 09/09/20.  Assessment & Plan    Routine Health Maintenance and Physical Exam  Exercise Activities and Dietary recommendations Goals    . Exercise 150 minutes per week (moderate activity)       Immunization History  Administered Date(s) Administered  . Influenza Split 04/24/2011, 08/05/2012  . Influenza,inj,Quad PF,6+ Mos 04/02/2017,  04/02/2018, 03/04/2019  . PPD Test 07/23/2011  . Tdap 05/16/2011    Health Maintenance  Topic Date Due  . Hepatitis C Screening  Never done  . COVID-19 Vaccine (1) Never done  . INFLUENZA VACCINE  10/26/2020 (Originally 01/17/2020)  . TETANUS/TDAP  05/15/2021  . PAP SMEAR-Modifier  12/29/2021  . HIV Screening  Completed  . HPV VACCINES  Aged Out    Discussed health benefits of physical activity, and encouraged her to engage in regular exercise appropriate for her age and condition.  1. Annual physical exam Normal physical exam today. Will check labs as below and f/u pending lab results. If labs are stable and WNL she will not need to have these rechecked for one year at her next annual physical exam. She is to call the office in the meantime if she has any acute issue, questions or concerns. - CBC w/Diff/Platelet - Comprehensive Metabolic Panel (CMET) - TSH - Lipid Panel With LDL/HDL Ratio - HgB A1c  2. Encounter for breast cancer screening using non-mammogram modality Breast exam today was normal. There is no family history of breast cancer. She does perform regular self breast exams. Mammogram was ordered as below. Information for [facility] clinic was given to patient so she may schedule her mammogram at her convenience. - MM 3D SCREEN BREAST BILATERAL; Future  3. Gastroesophageal reflux disease without esophagitis Stable. Diagnosis pulled for medication refill. Continue current medical treatment plan. - omeprazole (PRILOSEC) 40 MG capsule; Take 1 capsule (40 mg total) by mouth daily.  Dispense: 90 capsule; Refill: 1  4. PCOS (polycystic ovarian syndrome) Will check labs as below and f/u pending results. - TSH - HgB A1c  5. Class 1 obesity due to excess calories with serious comorbidity and body mass index (BMI) of 33.0 to 33.9 in adult Counseled patient on healthy lifestyle modifications including dieting and exercise.  Will check labs as below and f/u pending  results. - CBC w/Diff/Platelet - Comprehensive Metabolic Panel (CMET) - TSH - Lipid Panel With LDL/HDL Ratio - HgB A1c  6. Pelvic pain Acute suprapubic pain with brown vaginal discharge started over last 5 days. Feels crampy and is tender to touch. Patient does have h/o PCOS. No known fibroid history. UA normal. Urine pregnancy negative. Will send urine for STD screen as below. Also have ordered pelvic and transvaginal US for further evaluation. She does have scheduled annual pap with Dr. Shawnie Pons next week.  - POCT Urinalysis Dipstick - POCT urine pregnancy - GC/Chlamydia Probe Amp([facility]) - US Pelvic Complete With Transvaginal; Future   No follow-ups on file.     Delmer Islam, PA-C, have reviewed all documentation for this visit. The documentation on 09/09/20 for the exam, diagnosis, procedures, and orders are all accurate and complete.   Reine Just  [facility] [phone] (phone) [phone] (fax)  [facility]

## 2020-09-09 NOTE — Patient Instructions (Signed)
[facility] [address] [place],  [place]  [address] Main: [phone]   Preventive Care 42-42 Years Old, Female Preventive care refers to lifestyle choices and visits with your health care provider that can promote health and wellness. This includes:  A yearly physical exam. This is also called an annual wellness visit.  Regular dental and eye exams.  Immunizations.  Screening for certain conditions.  Healthy lifestyle choices, such as: ? Eating a healthy diet. ? Getting regular exercise. ? Not using drugs or products that contain nicotine and tobacco. ? Limiting alcohol use. What can I expect for my preventive care visit? Physical exam Your health care provider will check your:  Height and weight. These may be used to calculate your BMI (body mass index). BMI is a measurement that tells if you are at a healthy weight.  Heart rate and blood pressure.  Body temperature.  Skin for abnormal spots. Counseling Your health care provider may ask you questions about your:  Past medical problems.  Family's medical history.  Alcohol, tobacco, and drug use.  Emotional well-being.  Home life and relationship well-being.  Sexual activity.  Diet, exercise, and sleep habits.  Work and work environment.  Access to firearms.  Method of birth control.  Menstrual cycle.  Pregnancy history. What immunizations do I need? Vaccines are usually given at various ages, according to a schedule. Your health care provider will recommend vaccines for you based on your age, medical history, and lifestyle or other factors, such as travel or where you work.   What tests do I need? Blood tests  Lipid and cholesterol levels. These may be checked every 5 years, or more often if you are over 42 years old.  Hepatitis C test.  Hepatitis B test. Screening  Lung cancer screening. You may have this screening every year starting at age 42 if you  have a 30-pack-year history of smoking and currently smoke or have quit within the past 15 years.  Colorectal cancer screening. ? All adults should have this screening starting at age 42 and continuing until age 75. ? Your health care provider may recommend screening at age 42 if you are at increased risk. ? You will have tests every 1-10 years, depending on your results and the type of screening test.  Diabetes screening. ? This is done by checking your blood sugar (glucose) after you have not eaten for a while (fasting). ? You may have this done every 1-3 years.  Mammogram. ? This may be done every 1-2 years. ? Talk with your health care provider about when you should start having regular mammograms. This may depend on whether you have a family history of breast cancer.  BRCA-related cancer screening. This may be done if you have a family history of breast, ovarian, tubal, or peritoneal cancers.  Pelvic exam and Pap test. ? This may be done every 3 years starting at age 42. ? Starting at age 42, this may be done every 5 years if you have a Pap test in combination with an HPV test. Other tests  STD (sexually transmitted disease) testing, if you are at risk.  Bone density scan. This is done to screen for osteoporosis. You may have this scan if you are at high risk for osteoporosis. Talk with your health care provider about your test results, treatment options, and if necessary, the need for more tests. Follow these instructions at home: Eating and drinking  Eat a diet   that includes fresh fruits and vegetables, whole grains, lean protein, and low-fat dairy products.  Take vitamin and mineral supplements as recommended by your health care provider.  Do not drink alcohol if: ? Your health care provider tells you not to drink. ? You are pregnant, may be pregnant, or are planning to become pregnant.  If you drink alcohol: ? Limit how much you have to 0-1 drink a day. ? Be aware of  how much alcohol is in your drink. In the [place] one drink equals one 12 oz bottle of beer (355 mL), one 5 oz glass of wine (148 mL), or one 1 oz glass of hard liquor (44 mL).   Lifestyle  Take daily care of your teeth and gums. Brush your teeth every morning and night with fluoride toothpaste. Floss one time each day.  Stay active. Exercise for at least 30 minutes 5 or more days each week.  Do not use any products that contain nicotine or tobacco, such as cigarettes, e-cigarettes, and chewing tobacco. If you need help quitting, ask your health care provider.  Do not use drugs.  If you are sexually active, practice safe sex. Use a condom or other form of protection to prevent STIs (sexually transmitted infections).  If you do not wish to become pregnant, use a form of birth control. If you plan to become pregnant, see your health care provider for a prepregnancy visit.  If told by your health care provider, take low-dose aspirin daily starting at age 42.  Find healthy ways to cope with stress, such as: ? Meditation, yoga, or listening to music. ? Journaling. ? Talking to a trusted person. ? Spending time with friends and family. Safety  Always wear your seat belt while driving or riding in a vehicle.  Do not drive: ? If you have been drinking alcohol. Do not ride with someone who has been drinking. ? When you are tired or distracted. ? While texting.  Wear a helmet and other protective equipment during sports activities.  If you have firearms in your house, make sure you follow all gun safety procedures. What's next?  Visit your health care provider once a year for an annual wellness visit.  Ask your health care provider how often you should have your eyes and teeth checked.  Stay up to date on all vaccines. This information is not intended to replace advice given to you by your health care provider. Make sure you discuss any questions you have with your health care  provider. Document Revised: 03/08/2020 Document Reviewed: 02/13/2018 Elsevier Patient Education  2021 Elsevier Inc.  

## 2020-09-10 LAB — CBC WITH DIFFERENTIAL/PLATELET
Basophils Absolute: 0 10*3/uL (ref 0.0–0.2)
Basos: 1 %
EOS (ABSOLUTE): 0.2 10*3/uL (ref 0.0–0.4)
Eos: 3 %
Hematocrit: 41.8 % (ref 34.0–46.6)
Hemoglobin: 14 g/dL (ref 11.1–15.9)
Immature Grans (Abs): 0 10*3/uL (ref 0.0–0.1)
Immature Granulocytes: 0 %
Lymphocytes Absolute: 1.6 10*3/uL (ref 0.7–3.1)
Lymphs: 21 %
MCH: 28.6 pg (ref 26.6–33.0)
MCHC: 33.5 g/dL (ref 31.5–35.7)
MCV: 86 fL (ref 79–97)
Monocytes Absolute: 0.5 10*3/uL (ref 0.1–0.9)
Monocytes: 6 %
Neutrophils Absolute: 5.5 10*3/uL (ref 1.4–7.0)
Neutrophils: 69 %
Platelets: 327 10*3/uL (ref 150–450)
RBC: 4.89 x10E6/uL (ref 3.77–5.28)
RDW: 13.6 % (ref 11.7–15.4)
WBC: 7.9 10*3/uL (ref 3.4–10.8)

## 2020-09-10 LAB — HEMOGLOBIN A1C
Est. average glucose Bld gHb Est-mCnc: 114 mg/dL
Hgb A1c MFr Bld: 5.6 % (ref 4.8–5.6)

## 2020-09-10 LAB — COMPREHENSIVE METABOLIC PANEL
ALT: 16 IU/L (ref 0–32)
AST: 20 IU/L (ref 0–40)
Albumin/Globulin Ratio: 1.7 (ref 1.2–2.2)
Albumin: 4.6 g/dL (ref 3.8–4.8)
Alkaline Phosphatase: 72 IU/L (ref 44–121)
BUN/Creatinine Ratio: 12 (ref 9–23)
BUN: 10 mg/dL (ref 6–24)
Bilirubin Total: 0.3 mg/dL (ref 0.0–1.2)
CO2: 22 mmol/L (ref 20–29)
Calcium: 9.6 mg/dL (ref 8.7–10.2)
Chloride: 101 mmol/L (ref 96–106)
Creatinine, Ser: 0.82 mg/dL (ref 0.57–1.00)
Globulin, Total: 2.7 g/dL (ref 1.5–4.5)
Glucose: 88 mg/dL (ref 65–99)
Potassium: 4.6 mmol/L (ref 3.5–5.2)
Sodium: 139 mmol/L (ref 134–144)
Total Protein: 7.3 g/dL (ref 6.0–8.5)
eGFR: 92 mL/min/{1.73_m2} (ref >59)

## 2020-09-10 LAB — LIPID PANEL WITH LDL/HDL RATIO
Cholesterol, Total: 196 mg/dL (ref 100–199)
HDL: 49 mg/dL (ref >39)
LDL Chol Calc (NIH): 129 mg/dL — ABNORMAL HIGH (ref 0–99)
LDL/HDL Ratio: 2.6 ratio (ref 0.0–3.2)
Triglycerides: 99 mg/dL (ref 0–149)
VLDL Cholesterol Cal: 18 mg/dL (ref 5–40)

## 2020-09-10 LAB — TSH: TSH: 2.02 u[IU]/mL (ref 0.450–4.500)

## 2020-09-12 ENCOUNTER — Telehealth: Payer: Self-pay | Admitting: Physician Assistant

## 2020-09-12 NOTE — Telephone Encounter (Signed)
Yes it can be.  I do hate it is going to be so far out.   I could refer her to GYN as they do these in their office and may can get her in quicker, but I cannot guarantee that.

## 2020-09-12 NOTE — Telephone Encounter (Signed)
Pt is sch at [facility] ultrasound dept on 10-03-2020 for pelvis ultrasound and would like to know if its ok for her to wait that long

## 2020-09-13 NOTE — Telephone Encounter (Signed)
Tried calling; pt's voicemail is full.  PEC please advise pt if she calls back.   Thanks,   -Naasir Carreira  

## 2020-09-14 ENCOUNTER — Telehealth: Payer: Self-pay

## 2020-09-14 LAB — GC/CHLAMYDIA PROBE AMP
Chlamydia trachomatis, NAA: NEGATIVE
Neisseria Gonorrhoeae by PCR: NEGATIVE

## 2020-09-14 NOTE — Telephone Encounter (Signed)
-----   Message from Margaretann Loveless, [place] sent at 09/14/2020  1:02 PM EDT ----- STD screenings are negative.

## 2020-09-14 NOTE — Telephone Encounter (Signed)
Returned patients call and she was advised. Patient wanted to advise you that she want be able to have ultrasound until 10/03/2020. Just a [place]

## 2020-09-14 NOTE — Telephone Encounter (Signed)
Called patient and no answer or could not LVMTRC due to mailbox full. Will try patient at a later time.

## 2020-09-15 ENCOUNTER — Other Ambulatory Visit: Payer: 59

## 2020-09-22 ENCOUNTER — Ambulatory Visit (INDEPENDENT_AMBULATORY_CARE_PROVIDER_SITE_OTHER): Payer: 59 | Admitting: Family Medicine

## 2020-09-22 ENCOUNTER — Other Ambulatory Visit: Payer: Self-pay

## 2020-09-22 ENCOUNTER — Encounter: Payer: Self-pay | Admitting: Family Medicine

## 2020-09-22 DIAGNOSIS — Z124 Encounter for screening for malignant neoplasm of cervix: Secondary | ICD-10-CM

## 2020-09-22 DIAGNOSIS — Z01419 Encounter for gynecological examination (general) (routine) without abnormal findings: Secondary | ICD-10-CM | POA: Diagnosis not present

## 2020-09-22 NOTE — Progress Notes (Signed)
Was having lower abdominal pain, is better now, PCP did order an [place]

## 2020-09-22 NOTE — Patient Instructions (Signed)
Preventive Care 11-42 Years Old, Female Preventive care refers to lifestyle choices and visits with your health care provider that can promote health and wellness. This includes:  A yearly physical exam. This is also called an annual wellness visit.  Regular dental and eye exams.  Immunizations.  Screening for certain conditions.  Healthy lifestyle choices, such as: ? Eating a healthy diet. ? Getting regular exercise. ? Not using drugs or products that contain nicotine and tobacco. ? Limiting alcohol use. What can I expect for my preventive care visit? Physical exam Your health care provider may check your:  Height and weight. These may be used to calculate your BMI (body mass index). BMI is a measurement that tells if you are at a healthy weight.  Heart rate and blood pressure.  Body temperature.  Skin for abnormal spots. Counseling Your health care provider may ask you questions about your:  Past medical problems.  Family's medical history.  Alcohol, tobacco, and drug use.  Emotional well-being.  Home life and relationship well-being.  Sexual activity.  Diet, exercise, and sleep habits.  Work and work Statistician.  Access to firearms.  Method of birth control.  Menstrual cycle.  Pregnancy history. What immunizations do I need? Vaccines are usually given at various ages, according to a schedule. Your health care provider will recommend vaccines for you based on your age, medical history, and lifestyle or other factors, such as travel or where you work.   What tests do I need? Blood tests  Lipid and cholesterol levels. These may be checked every 5 years starting at age 64.  Hepatitis C test.  Hepatitis B test. Screening  Diabetes screening. This is done by checking your blood sugar (glucose) after you have not eaten for a while (fasting).  STD (sexually transmitted disease) testing, if you are at risk.  BRCA-related cancer screening. This may  be done if you have a family history of breast, ovarian, tubal, or peritoneal cancers.  Pelvic exam and Pap test. This may be done every 3 years starting at age 61. Starting at age 82, this may be done every 5 years if you have a Pap test in combination with an HPV test. Talk with your health care provider about your test results, treatment options, and if necessary, the need for more tests.   Follow these instructions at home: Eating and drinking  Eat a healthy diet that includes fresh fruits and vegetables, whole grains, lean protein, and low-fat dairy products.  Take vitamin and mineral supplements as recommended by your health care provider.  Do not drink alcohol if: ? Your health care provider tells you not to drink. ? You are pregnant, may be pregnant, or are planning to become pregnant.  If you drink alcohol: ? Limit how much you have to 0-1 drink a day. ? Be aware of how much alcohol is in your drink. In the [place] one drink equals one 12 oz bottle of beer (355 mL), one 5 oz glass of wine (148 mL), or one 1 oz glass of hard liquor (44 mL).   Lifestyle  Take daily care of your teeth and gums. Brush your teeth every morning and night with fluoride toothpaste. Floss one time each day.  Stay active. Exercise for at least 30 minutes 5 or more days each week.  Do not use any products that contain nicotine or tobacco, such as cigarettes, e-cigarettes, and chewing tobacco. If you need help quitting, ask your health care provider.  Do  not use drugs.  If you are sexually active, practice safe sex. Use a condom or other form of protection to prevent STIs (sexually transmitted infections).  If you do not wish to become pregnant, use a form of birth control. If you plan to become pregnant, see your health care provider for a prepregnancy visit.  Find healthy ways to cope with stress, such as: ? Meditation, yoga, or listening to music. ? Journaling. ? Talking to a trusted  person. ? Spending time with friends and family. Safety  Always wear your seat belt while driving or riding in a vehicle.  Do not drive: ? If you have been drinking alcohol. Do not ride with someone who has been drinking. ? When you are tired or distracted. ? While texting.  Wear a helmet and other protective equipment during sports activities.  If you have firearms in your house, make sure you follow all gun safety procedures.  Seek help if you have been physically or sexually abused. What's next?  Go to your health care provider once a year for an annual wellness visit.  Ask your health care provider how often you should have your eyes and teeth checked.  Stay up to date on all vaccines. This information is not intended to replace advice given to you by your health care provider. Make sure you discuss any questions you have with your health care provider. Document Revised: 01/31/2020 Document Reviewed: 02/13/2018 Elsevier Patient Education  2021 [employer].

## 2020-09-22 NOTE — Progress Notes (Signed)
  Subjective:     Tanya Meza is a 40 y.o. female and is here for a comprehensive physical exam. The patient reports no problems. She is s/p endometrial ablation in 2020 and is very happy with results. Has very light regular cycles.     The following portions of the patient's history were reviewed and updated as appropriate: allergies, current medications, past family history, past medical history, past social history, past surgical history and problem list.  Review of Systems Pertinent items noted in HPI and remainder of comprehensive ROS otherwise negative.   Objective:    BP 121/77   Pulse 80   Ht 5\' 5"  (1.651 m)   Wt 197 lb (89.4 kg)   BMI 32.78 kg/m  General appearance: alert, cooperative and appears stated age Head: Normocephalic, without obvious abnormality, atraumatic Neck: supple, normal thyroid Lungs: clear to auscultation bilaterally Breasts: normal appearance, no masses or tenderness Heart: regular rate and rhythm, S1, S2 normal, no murmur, click, rub or gallop Abdomen: soft, non-tender; bowel sounds normal; no masses,  no organomegaly Pelvic: cervix normal in appearance, external genitalia normal, no adnexal masses or tenderness, no cervical motion tenderness, uterus normal size, shape, and consistency and vagina normal without discharge Extremities: Homans sign is negative, no sign of DVT Pulses: 2+ and symmetric Skin: Skin color, texture, turgor normal. No rashes or lesions Lymph nodes: Cervical, supraclavicular, and axillary nodes normal. Neurologic: Grossly normal    Assessment:    Healthy female exam.      Plan:   Problem List Items Addressed This Visit   None   Visit Diagnoses    Encounter for gynecological examination without abnormal finding       Usual prevention re-inforced. Has PCP     Mammogram ordered by PCP  See After Visit Summary for Counseling Recommendations

## 2020-09-23 ENCOUNTER — Encounter: Payer: Self-pay | Admitting: Family Medicine

## 2020-10-03 ENCOUNTER — Ambulatory Visit: Payer: 59

## 2020-12-27 ENCOUNTER — Ambulatory Visit
Admission: RE | Admit: 2020-12-27 | Discharge: 2020-12-27 | Disposition: A | Discharge status: Home / Self Care | Payer: 59 | Source: Ambulatory Visit | Attending: Family Medicine | Admitting: Family Medicine

## 2020-12-27 ENCOUNTER — Other Ambulatory Visit: Payer: Self-pay

## 2020-12-27 DIAGNOSIS — Z1239 Encounter for other screening for malignant neoplasm of breast: Secondary | ICD-10-CM

## 2021-05-08 ENCOUNTER — Ambulatory Visit: Payer: Self-pay

## 2021-05-08 ENCOUNTER — Encounter: Payer: Self-pay | Admitting: Emergency Medicine

## 2021-05-08 ENCOUNTER — Emergency Department
Admission: EM | Admit: 2021-05-08 | Discharge: 2021-05-08 | Disposition: A | Discharge status: Home / Self Care | Payer: 59 | Attending: Emergency Medicine | Admitting: Emergency Medicine

## 2021-05-08 ENCOUNTER — Other Ambulatory Visit: Payer: Self-pay

## 2021-05-08 ENCOUNTER — Emergency Department: Payer: 59

## 2021-05-08 DIAGNOSIS — R1013 Epigastric pain: Secondary | ICD-10-CM | POA: Insufficient documentation

## 2021-05-08 DIAGNOSIS — R079 Chest pain, unspecified: Secondary | ICD-10-CM | POA: Diagnosis not present

## 2021-05-08 DIAGNOSIS — Z91040 Latex allergy status: Secondary | ICD-10-CM | POA: Insufficient documentation

## 2021-05-08 LAB — BASIC METABOLIC PANEL
Anion gap: 5 (ref 5–15)
BUN: 17 mg/dL (ref 6–20)
CO2: 25 mmol/L (ref 22–32)
Calcium: 8.6 mg/dL — ABNORMAL LOW (ref 8.9–10.3)
Chloride: 103 mmol/L (ref 98–111)
Creatinine, Ser: 0.73 mg/dL (ref 0.44–1.00)
GFR, Estimated: >60 mL/min (ref >60)
Glucose, Bld: 96 mg/dL (ref 70–99)
Potassium: 3.6 mmol/L (ref 3.5–5.1)
Sodium: 133 mmol/L — ABNORMAL LOW (ref 135–145)

## 2021-05-08 LAB — CBC
HCT: 42.9 % (ref 36.0–46.0)
Hemoglobin: 14.1 g/dL (ref 12.0–15.0)
MCH: 28.8 pg (ref 26.0–34.0)
MCHC: 32.9 g/dL (ref 30.0–36.0)
MCV: 87.7 fL (ref 80.0–100.0)
Platelets: 321 10*3/uL (ref 150–400)
RBC: 4.89 MIL/uL (ref 3.87–5.11)
RDW: 14 % (ref 11.5–15.5)
WBC: 9.4 10*3/uL (ref 4.0–10.5)
nRBC: 0 % (ref 0.0–0.2)

## 2021-05-08 LAB — TROPONIN I (HIGH SENSITIVITY): Troponin I (High Sensitivity): 5 ng/L (ref <18)

## 2021-05-08 NOTE — Telephone Encounter (Signed)
Started having chest pain last night. Hurts in the middle of her chest. Hurts to take a deep breath."Feels like I have a lump sitting there." Instructed to have someone drive her to ED now. Verbalizes understanding.     Reason for Disposition  [1] Chest pain (or "angina") comes and goes AND [2] is happening more often (increasing in frequency) or getting worse (increasing in severity) (Exception: chest pains that last only a few seconds)  Answer Assessment - Initial Assessment Questions 1. LOCATION: "Where does it hurt?"       Middle 2. RADIATION: "Does the pain go anywhere else?" (e.g., into neck, jaw, arms, back)     No 3. ONSET: "When did the chest pain begin?" (Minutes, hours or days)      Yesterday 4. PATTERN "Does the pain come and go, or has it been constant since it started?"  "Does it get worse with exertion?"      Constant 5. DURATION: "How long does it last" (e.g., seconds, minutes, hours)     Hours 6. SEVERITY: "How bad is the pain?"  (e.g., Scale 1-10; mild, moderate, or severe)    - MILD (1-3): doesn't interfere with normal activities     - MODERATE (4-7): interferes with normal activities or awakens from sleep    - SEVERE (8-10): excruciating pain, unable to do any normal activities       7 7. CARDIAC RISK FACTORS: "Do you have any history of heart problems or risk factors for heart disease?" (e.g., angina, prior heart attack; diabetes, high blood pressure, high cholesterol, smoker, or strong family history of heart disease)     No 8. PULMONARY RISK FACTORS: "Do you have any history of lung disease?"  (e.g., blood clots in lung, asthma, emphysema, birth control pills)     No 9. CAUSE: "What do you think is causing the chest pain?"     Unsure 10. OTHER SYMPTOMS: "Do you have any other symptoms?" (e.g., dizziness, nausea, vomiting, sweating, fever, difficulty breathing, cough)       Hurts to take a deep breath 11. PREGNANCY: "Is there any chance you are pregnant?" "When  was your last menstrual period?"       No  Protocols used: Chest Pain-A-AH

## 2021-05-08 NOTE — ED Triage Notes (Signed)
Pt reports started yesterday with cp to her mid chest/epigastric area that is sometimes sharp and constantly feels like she has a knot there. Pt denies SOB or nausea but states it hurts worse when she takes a deep breath in

## 2021-05-08 NOTE — Discharge Instructions (Signed)
We suspect that you likely either have gastritis, acid reflux, or potentially problems with the esophagus.  Take your omeprazole daily for at least the next several weeks or until you follow-up with gastroenterology.  Make an appointment to follow-up with the gastroenterologist.  Return to the ER for new, worsening, or persistent severe abdominal or chest pain, difficulty breathing, vomiting, fever, weakness or lightheadedness, blood in the stool, or any other new or worsening symptoms that concern you.

## 2021-05-08 NOTE — ED Provider Notes (Signed)
[facility] Emergency Department Provider Note ____________________________________________   Event Date/Time   First MD Initiated Contact with Patient 05/08/21 1611     (approximate)  I have reviewed the triage vital signs and the nursing notes.   HISTORY  Chief Complaint Chest Pain    HPI Tanya Meza is a 40 y.o. female with PMH as noted below including GERD and PCOS who presents with epigastric and chest pain, acute onset yesterday, persistent course since then although mild in intensity.  The patient states that after eating a granola bar earlier the pain became somewhat sharper for a brief time.  She denies any pain higher in the chest.  She denies any right-sided abdominal pain.  She has no nausea or vomiting, diarrhea or change in bowel movements, fever, or urinary symptoms.  The patient states that the pain feels somewhat different than when she has had GERD in the past.  She has been on omeprazole previously although states she only takes it when she feels like she needs to and has not been taking it recently.  She did get a refill for yesterday.  The patient denies any unusual foods although did have a pre-Thanksgiving dinner a couple nights ago.  She denies any heavy alcohol use.  She has no shortness of breath, cough, or leg pain or swelling.  Past Medical History:  Diagnosis Date   Complication of anesthesia    GERD (gastroesophageal reflux disease)    with preg, on zantac   Nephrolithiasis    no surgery required   PCOS (polycystic ovarian syndrome)    Tachycardia    RVOF    Patient Active Problem List   Diagnosis Date Noted   Class 1 obesity due to excess calories with serious comorbidity and body mass index (BMI) of 33.0 to 33.9 in adult 09/09/2020   Menorrhagia with regular cycle 12/30/2018   GERD (gastroesophageal reflux disease) 08/15/2018   PCOS (polycystic ovarian syndrome) 11/22/2015   Calculus of kidney 12/30/2014   Fast heart  beat 12/30/2014   Acute stress disorder 12/30/2014    Past Surgical History:  Procedure Laterality Date   CESAREAN SECTION  2005, 2008   x3   CESAREAN SECTION WITH BILATERAL TUBAL LIGATION     DILITATION & CURRETTAGE/HYSTROSCOPY WITH HYDROTHERMAL ABLATION N/A 05/06/2019   Procedure: DILATATION & CURETTAGE/HYSTEROSCOPY WITH HYDROTHERMAL ABLATION;  Surgeon: Reva Bores, MD;  Location: [facility];  Service: Gynecology;  Laterality: N/A;   WISDOM TOOTH EXTRACTION      Prior to Admission medications   Medication Sig Start Date End Date Taking? Authorizing Provider  omeprazole (PRILOSEC) 40 MG capsule Take 1 capsule (40 mg total) by mouth daily. 09/09/20   Margaretann Loveless, PA-C    Allergies Latex  Family History  Problem Relation Age of Onset   Thyroid disease Mother    Hyperlipidemia Mother    Hypothyroidism Mother    Tongue cancer Mother    Diabetes Father    Hyperlipidemia Father    Hypertension Brother    Heart attack Maternal Grandfather    Alcohol abuse Paternal Grandfather    Heart attack Paternal Grandfather    Hyperlipidemia Brother    Breast cancer Maternal Aunt    Anesthesia problems Neg Hx    Colon cancer Neg Hx     Social History Social History   Tobacco Use   Smoking status: Never   Smokeless tobacco: Never  Vaping Use   Vaping Use: Never used  Substance Use Topics   Alcohol use: Yes    Comment: social   Drug use: No    Review of Systems  Constitutional: No fever/chills Eyes: No visual changes. ENT: No sore throat. Cardiovascular: Positive for chest pain. Respiratory: Denies shortness of breath. Gastrointestinal: No nausea, no vomiting.  No diarrhea.  Genitourinary: Negative for dysuria.  Musculoskeletal: Negative for back pain. Skin: Negative for rash. Neurological: Negative for headaches, focal weakness or numbness.   ____________________________________________   PHYSICAL EXAM:  VITAL SIGNS: ED Triage Vitals   Enc Vitals Group     BP 05/08/21 1251 122/73     Pulse Rate 05/08/21 1251 70     Resp 05/08/21 1251 20     Temp 05/08/21 1251 98.1 F (36.7 C)     Temp Source 05/08/21 1251 Oral     SpO2 05/08/21 1251 100 %     Weight 05/08/21 1239 198 lb (89.8 kg)     Height 05/08/21 1239 5\' 5"  (1.651 m)     Head Circumference --      Peak Flow --      Pain Score 05/08/21 1238 7     Pain Loc --      Pain Edu? --      Excl. in GC? --     Constitutional: Alert and oriented. Well appearing and in no acute distress. Eyes: Conjunctivae are normal.  Head: Atraumatic. Nose: No congestion/rhinnorhea. Mouth/Throat: Mucous membranes are moist.   Neck: Normal range of motion.  Cardiovascular: Normal rate, regular rhythm. Grossly normal heart sounds.  Good peripheral circulation. Respiratory: Normal respiratory effort.  No retractions. Lungs CTAB. Gastrointestinal: Soft with mild epigastric discomfort but no focal tenderness.  No distention.  Genitourinary: No flank tenderness. Musculoskeletal: No lower extremity edema.  Extremities warm and well perfused.  Neurologic:  Normal speech and language. No gross focal neurologic deficits are appreciated.  Skin:  Skin is warm and dry. No rash noted. Psychiatric: Mood and affect are normal. Speech and behavior are normal.  ____________________________________________   LABS (all labs ordered are listed, but only abnormal results are displayed)  Labs Reviewed  BASIC METABOLIC PANEL - Abnormal; Notable for the following components:      Result Value   Sodium 133 (*)    Calcium 8.6 (*)    All other components within normal limits  CBC  POC URINE PREG, ED  TROPONIN I (HIGH SENSITIVITY)   ____________________________________________  EKG ED ECG REPORT I, 05/10/21, the attending physician, personally viewed and interpreted this ECG.  Date: 05/08/2021 EKG Time: 1239 Rate: 72 Rhythm: normal sinus rhythm QRS Axis: normal Intervals:  normal ST/T Wave abnormalities: No acute ST abnormality; new inferior Q waves when compared to EKG of 03/03/2020 Narrative Interpretation: no evidence of acute ischemia  ____________________________________________  RADIOLOGY  Chest x-ray interpreted by me shows no focal consolidation or edema  ____________________________________________   PROCEDURES  Procedure(s) performed: No  Procedures  Critical Care performed: No ____________________________________________   INITIAL IMPRESSION / ASSESSMENT AND PLAN / ED COURSE  Pertinent labs & imaging results that were available during my care of the patient were reviewed by me and considered in my medical decision making (see chart for details).   40 year old female with history of GERD and PCOS presents with atypical lower chest/upper epigastric pain since yesterday area which is worse with food but not associated with nausea or vomiting.  She has a history of GERD but has been noncompliant with omeprazole.  On exam the patient  is well-appearing and her vital signs are normal.  The abdomen is soft with mild epigastric discomfort but no focal tenderness.  The physical exam is otherwise unremarkable.  Overall presentation is consistent with gastritis, GERD, esophageal dysmotility, or likely other subacute GI etiology.  There is no evidence of esophageal food impaction or obstruction as the patient is able to eat and drink without difficulty.  I also do not suspect biliary colic or cholecystitis given the location and nature of the pain.  Given the patient's age and lack of risk factors, I have a very low suspicion for ACS.  Initial troponin is negative and given the duration of the symptoms, continuous since yesterday, there is no indication for a repeat.  EKG shows no acute ischemic changes (the EKG today does demonstrate Q waves in the inferior leads particularly III when compared to her most recent EKG from a few years ago but no ST  abnormalities or evidence of acute ischemia).  She is PERC negative and has no signs or symptoms of DVT; given the lack of tachycardia, hypoxia, or respiratory symptoms, as well as the location of the pain, I do not suspect PE or vascular etiology.  At this time, given the negative work-up, well appearance, and relatively mild symptoms the patient is stable for discharge home.  I counseled her on the likely etiology of her symptoms and nstructed her to start taking her omeprazole again and take it consistently.  I have given her GI referral.  I gave the patient very thorough return precautions and she expressed understanding.  ____________________________________________   FINAL CLINICAL IMPRESSION(S) / ED DIAGNOSES  Final diagnoses:  Epigastric pain      NEW MEDICATIONS STARTED DURING THIS VISIT:  Discharge Medication List as of 05/08/2021  4:38 PM       Note:  This document was prepared using Dragon voice recognition software and may include unintentional dictation errors.    Dionne Bucy, MD 05/08/21 1732

## 2021-05-08 NOTE — ED Provider Notes (Signed)
Emergency Medicine Provider Triage Evaluation Note  Tanya Meza , a 40 y.o. female  was evaluated in triage.  Pt complains of midsternal chest pain, feels like a golf ball sitting in the area, feels full, no fever, chills, N/V/D or shortness of breath.  Review of Systems  Positive: Midsternal chest pain, sensation of fullness Negative: Fever, chills, shortness of breath, vomiting or diarrhea  Physical Exam  Ht 5\' 5"  (1.651 m)   Wt 89.8 kg   BMI 32.95 kg/m  Gen:   Awake, no distress   Resp:  Normal effort  MSK:   Moves extremities without difficulty  Other:    Medical Decision Making  Medically screening exam initiated at 12:43 PM.  Appropriate orders placed.  Tanya Meza was informed that the remainder of the evaluation will be completed by another provider, this initial triage assessment does not replace that evaluation, and the importance of remaining in the ED until their evaluation is complete.  Patient appears to be very stable.  Labs and imaging are being ordered per protocols.   Cherie Ouch, PA-C 05/08/21 1244    05/10/21, MD 05/08/21 [phone]

## 2021-07-04 ENCOUNTER — Other Ambulatory Visit: Payer: Self-pay | Admitting: Gastroenterology

## 2021-07-04 DIAGNOSIS — R1013 Epigastric pain: Secondary | ICD-10-CM

## 2021-07-07 ENCOUNTER — Ambulatory Visit
Admission: RE | Admit: 2021-07-07 | Discharge: 2021-07-07 | Disposition: A | Discharge status: Home / Self Care | Payer: 59 | Source: Ambulatory Visit | Attending: Gastroenterology | Admitting: Gastroenterology

## 2021-07-07 ENCOUNTER — Other Ambulatory Visit: Payer: Self-pay

## 2021-07-07 DIAGNOSIS — R1013 Epigastric pain: Secondary | ICD-10-CM | POA: Diagnosis present

## 2021-08-12 ENCOUNTER — Encounter: Payer: Self-pay | Admitting: Radiology

## 2021-09-19 ENCOUNTER — Other Ambulatory Visit: Payer: Self-pay | Admitting: Physician Assistant

## 2021-09-19 DIAGNOSIS — K219 Gastro-esophageal reflux disease without esophagitis: Secondary | ICD-10-CM

## 2021-09-22 NOTE — Telephone Encounter (Signed)
Pt was calling to find out the status of this Rx refill request, pt wanted to know if it will be sent in, please advise.  ?

## 2021-09-26 ENCOUNTER — Encounter: Payer: Self-pay | Admitting: Physician Assistant

## 2021-09-26 ENCOUNTER — Telehealth (INDEPENDENT_AMBULATORY_CARE_PROVIDER_SITE_OTHER): Payer: 59 | Admitting: Physician Assistant

## 2021-09-26 DIAGNOSIS — K219 Gastro-esophageal reflux disease without esophagitis: Secondary | ICD-10-CM

## 2021-09-26 MED ORDER — OMEPRAZOLE 40 MG PO CPDR: 40.0000 mg | DELAYED_RELEASE_CAPSULE | Freq: Every day | ORAL | 3 refills | Status: DC

## 2021-09-26 NOTE — Progress Notes (Signed)
? ? ?I,Sha'taria Tyson,acting as a Neurosurgeon for [employer], PA-C.,have documented all relevant documentation on the behalf of Alfredia Ferguson, PA-C,as directed by  Alfredia Ferguson, PA-C while in the presence of Alfredia Ferguson, PA-C. ? ?MyChart Video Visit ? ? ? ?Virtual Visit via Video Note  ? ?This visit type was conducted due to national recommendations for restrictions regarding the COVID-19 Pandemic (e.g. social distancing) in an effort to limit this patient's exposure and mitigate transmission in our community. This patient is at least at moderate risk for complications without adequate follow up. This format is felt to be most appropriate for this patient at this time. Physical exam was limited by quality of the video and audio technology used for the visit.  ? ?Patient location: home ?Provider location: [facility] ? ?I discussed the limitations of evaluation and management by telemedicine and the availability of in person appointments. The patient expressed understanding and agreed to proceed. ? ?Patient: Tanya Meza   DOB: 10-Nov-1980   41 y.o. Female  MRN: [id number] ?Visit Date: 09/26/2021 ? ?Today's healthcare provider: Alfredia Ferguson, PA-C  ?Cc. Omeprazole refill ? ?Subjective  ?  ?HPI  ?Patient would like to discuss medication refills.  ?Endo scheduled for end of April.  ?GERD, Follow up: ? ?The patient was last seen for GERD 1 years ago. ?Changes made since that visit include continue current treatment. ? ?She reports excellent compliance with treatment. ?She is not having side effects. . ? ?She IS experiencing reflux symptoms. Follows with GI and has an endoscopy scheduled. ? ?She is NOT experiencing abdominal bloating, belching, belching and eructation, bilious reflux, chest pain, choking on food, cough, deep pressure at base of neck, difficulty swallowing, dysphagia, early satiety, fullness after meals, heartburn, hematemesis, hoarseness, laryngitis, melena, midespigastric pain,  nausea, need to clear throat frequently, nocturnal burning, odynophagia, regurgitation of undigested food, shortness of breath, symptoms primarily relate to meals, and lying down after meals, unexpected weight loss, upper abdominal discomfort, waterbrash, or wheezing ? ?-----------------------------------------------------------------------------------------  ? ? ?Medications: ?Outpatient Medications Prior to Visit  ?Medication Sig  ? [DISCONTINUED] omeprazole (PRILOSEC) 40 MG capsule TAKE 1 CAPSULE BY MOUTH ONCE DAILY  ? ?No facility-administered medications prior to visit.  ? ? ?Review of Systems  ?Constitutional:  Negative for fatigue and fever.  ?Respiratory:  Negative for cough and shortness of breath.   ?Cardiovascular:  Negative for chest pain and leg swelling.  ?Gastrointestinal:  Negative for abdominal pain.  ?Neurological:  Negative for dizziness and headaches.  ? ? ? ? Objective  ?  ?There were no vitals taken for this visit. ? ? ? ?Physical Exam ?Neurological:  ?   Mental Status: She is oriented to person, place, and time.  ?Psychiatric:     ?   Mood and Affect: Mood normal.     ?   Behavior: Behavior normal.  ?  ? ? ? Assessment & Plan  ?  ? ? ?Problem List Items Addressed This Visit   ? ?  ? Digestive  ? GERD (gastroesophageal reflux disease)  ?  Will refill omeprazole 40 mg advised to take in AM on empty stomach at least 30 minutes before food ?Pt will follow with GI ?  ?  ? Relevant Medications  ? omeprazole (PRILOSEC) 40 MG capsule  ?  ?Return in about 2 months (around 11/26/2021) for CPE.  ?  ? ?I discussed the assessment and treatment plan with the patient. The patient was provided an opportunity to ask questions and  all were answered. The patient agreed with the plan and demonstrated an understanding of the instructions. ?  ?The patient was advised to call back or seek an in-person evaluation if the symptoms worsen or if the condition fails to improve as anticipated. ? ?I provided 5 minutes of  non-face-to-face time during this encounter. ? ?I, Alfredia Ferguson, PA-C have reviewed all documentation for this visit. The documentation on  09/26/2021 for the exam, diagnosis, procedures, and orders are all accurate and complete. ? ?Alfredia Ferguson, PA-C ?[facility] ?[address] ?[place], [place], [address] ?Office: [phone] ?Fax: [phone]  ? ?[facility]  ? ?

## 2021-09-26 NOTE — Assessment & Plan Note (Signed)
Will refill omeprazole 40 mg advised to take in AM on empty stomach at least 30 minutes before food ?Pt will follow with GI ?

## 2021-10-05 ENCOUNTER — Encounter: Payer: Self-pay | Admitting: Gastroenterology

## 2021-10-05 NOTE — H&P (Signed)
? ?Pre-Procedure H&P ?  ?Patient ID: Tanya Meza is a 41 y.o. female. ? ?Gastroenterology Provider: [name], DO ? ?PCP: Alfredia Ferguson, PA-C ? ?Date: 10/06/2021 ? ?HPI ?Tanya Meza is a 41 y.o. female who presents today for Esophagogastroduodenoscopy for Epigastric pain, reflux, dysphagia. ?Pt with waxing and waning epigastric pain. Was worse in November which prompted ED visit. Notes xiphoid sticking sensation when swallowing dry foods. No issues with liquids or pills. No odynophagia. Weight and appetite stable. Taking omeprazole. ?BM once per week.  ?LFTs wnl; [place] - [facility]. ? ?Past Medical History:  ?Diagnosis Date  ? Complication of anesthesia   ? GERD (gastroesophageal reflux disease)   ? with preg, on zantac  ? Nephrolithiasis   ? no surgery required  ? PCOS (polycystic ovarian syndrome)   ? Tachycardia   ? RVOF  ? ? ?Past Surgical History:  ?Procedure Laterality Date  ? CESAREAN SECTION  2005, 2008  ? x3  ? CESAREAN SECTION WITH BILATERAL TUBAL LIGATION    ? DILITATION & CURRETTAGE/HYSTROSCOPY WITH HYDROTHERMAL ABLATION N/A 05/06/2019  ? Procedure: DILATATION & CURETTAGE/HYSTEROSCOPY WITH HYDROTHERMAL ABLATION;  Surgeon: Reva Bores, MD;  Location: [facility];  Service: Gynecology;  Laterality: N/A;  ? WISDOM TOOTH EXTRACTION    ? ? ?Family History ?No h/o GI disease or malignancy ? ?Review of Systems  ?Constitutional:  Negative for activity change, appetite change, chills, diaphoresis, fatigue, fever and unexpected weight change.  ?HENT:  Positive for trouble swallowing. Negative for voice change.   ?Respiratory:  Negative for shortness of breath and wheezing.   ?Cardiovascular:  Negative for chest pain, palpitations and leg swelling.  ?Gastrointestinal:  Positive for abdominal pain. Negative for abdominal distention, anal bleeding, blood in stool, constipation, diarrhea, nausea, rectal pain and vomiting.  ?Musculoskeletal:  Negative for arthralgias and myalgias.   ?Skin:  Negative for color change and pallor.  ?Neurological:  Negative for dizziness, syncope and weakness.  ?Psychiatric/Behavioral:  Negative for confusion.   ?All other systems reviewed and are negative.  ? ?Medications ?No current facility-administered medications on file prior to encounter.  ? ?No current outpatient medications on file prior to encounter.  ? ? ?Pertinent medications related to GI and procedure were reviewed by me with the patient prior to the procedure ? ? ?Current Facility-Administered Medications:  ?  0.9 %  sodium chloride infusion, , Intravenous, Continuous, [name], DO, Last Rate: 20 mL/hr at 10/06/21 9628, Continued from Pre-op at 10/06/21 3662 ?  ?  ? ?Allergies  ?Allergen Reactions  ? Latex Swelling  ?  Swelling- around catheter  ? ?Allergies were reviewed by me prior to the procedure ? ?Objective  ? ?Body mass index is 32.45 kg/m?. ?Vitals:  ? 10/06/21 0758  ?BP: (!) 127/50  ?Pulse: 63  ?Resp: 15  ?Temp: 97.9 ?F (36.6 ?C)  ?TempSrc: Temporal  ?SpO2: 98%  ?Weight: 88.5 kg  ?Height: 5\' 5"  (1.651 m)  ? ? ? Physical Exam ?Vitals and nursing note reviewed.  ?Constitutional:   ?   General: She is not in acute distress. ?   Appearance: Normal appearance. She is not ill-appearing, toxic-appearing or diaphoretic.  ?HENT:  ?   Head: Normocephalic and atraumatic.  ?   Nose: Nose normal.  ?   Mouth/Throat:  ?   Mouth: Mucous membranes are moist.  ?   Pharynx: Oropharynx is clear.  ?Eyes:  ?   General: No scleral icterus. ?   Extraocular Movements: Extraocular  movements intact.  ?Cardiovascular:  ?   Rate and Rhythm: Normal rate and regular rhythm.  ?   Heart sounds: Normal heart sounds. No murmur heard. ?  No friction rub. No gallop.  ?Pulmonary:  ?   Effort: Pulmonary effort is normal. No respiratory distress.  ?   Breath sounds: Normal breath sounds. No wheezing, rhonchi or rales.  ?Abdominal:  ?   General: Bowel sounds are normal. There is no distension.  ?   Palpations: Abdomen  is soft.  ?   Tenderness: There is no abdominal tenderness. There is no guarding or rebound.  ?Musculoskeletal:  ?   Cervical back: Neck supple.  ?   Right lower leg: No edema.  ?   Left lower leg: No edema.  ?Skin: ?   General: Skin is warm and dry.  ?   Coloration: Skin is not jaundiced or pale.  ?Neurological:  ?   General: No focal deficit present.  ?   Mental Status: She is alert and oriented to person, place, and time. Mental status is at baseline.  ?Psychiatric:     ?   Mood and Affect: Mood normal.     ?   Behavior: Behavior normal.     ?   Thought Content: Thought content normal.     ?   Judgment: Judgment normal.  ? ? ? ?Assessment:  ?Tanya Meza is a 41 y.o. female  who presents today for Esophagogastroduodenoscopy for Epigastric pain, reflux, dysphagia. ?Plan:  ?Esophagogastroduodenoscopy with possible intervention today ? ?Esophagogastroduodenoscopy with possible biopsy, control of bleeding, polypectomy, and interventions as necessary has been discussed with the patient/patient representative. Informed consent was obtained from the patient/patient representative after explaining the indication, nature, and risks of the procedure including but not limited to death, bleeding, perforation, missed neoplasm/lesions, cardiorespiratory compromise, and reaction to medications. Opportunity for questions was given and appropriate answers were provided. Patient/patient representative has verbalized understanding is amenable to undergoing the procedure. ? ? ?[name], DO  ?[facility] ? ?Portions of the record may have been created with voice recognition software. Occasional wrong-word or 'sound-a-like' substitutions may have occurred due to the inherent limitations of voice recognition software.  Read the chart carefully and recognize, using context, where substitutions may have occurred. ?

## 2021-10-06 ENCOUNTER — Encounter: Payer: Self-pay | Admitting: Gastroenterology

## 2021-10-06 ENCOUNTER — Ambulatory Visit
Admission: RE | Admit: 2021-10-06 | Discharge: 2021-10-06 | Disposition: A | Discharge status: Home / Self Care | Payer: 59 | Attending: Gastroenterology | Admitting: Gastroenterology

## 2021-10-06 ENCOUNTER — Ambulatory Visit: Payer: 59 | Admitting: Certified Registered Nurse Anesthetist

## 2021-10-06 ENCOUNTER — Encounter
Admission: RE | Disposition: A | Discharge status: Home / Self Care | Payer: Self-pay | Source: Home / Self Care | Attending: Gastroenterology

## 2021-10-06 DIAGNOSIS — K2100 Gastro-esophageal reflux disease with esophagitis, without bleeding: Secondary | ICD-10-CM | POA: Diagnosis not present

## 2021-10-06 DIAGNOSIS — K2289 Other specified disease of esophagus: Secondary | ICD-10-CM | POA: Insufficient documentation

## 2021-10-06 DIAGNOSIS — R1310 Dysphagia, unspecified: Secondary | ICD-10-CM | POA: Insufficient documentation

## 2021-10-06 DIAGNOSIS — K2950 Unspecified chronic gastritis without bleeding: Secondary | ICD-10-CM | POA: Insufficient documentation

## 2021-10-06 HISTORY — PX: ESOPHAGOGASTRODUODENOSCOPY: SHX5428

## 2021-10-06 LAB — POCT PREGNANCY, URINE: Preg Test, Ur: NEGATIVE

## 2021-10-06 SURGERY — EGD (ESOPHAGOGASTRODUODENOSCOPY)
Anesthesia: General

## 2021-10-06 MED ORDER — SODIUM CHLORIDE 0.9 % IV SOLN: INTRAVENOUS | Status: DC

## 2021-10-06 MED ORDER — LIDOCAINE HCL (PF) 2 % IJ SOLN
INTRAMUSCULAR | Status: AC
  Filled 2021-10-06: qty 5

## 2021-10-06 MED ORDER — PROPOFOL 500 MG/50ML IV EMUL
INTRAVENOUS | Status: AC
  Filled 2021-10-06: qty 50

## 2021-10-06 MED ORDER — LIDOCAINE HCL (CARDIAC) PF 100 MG/5ML IV SOSY
PREFILLED_SYRINGE | INTRAVENOUS | Status: DC | PRN
  Administered 2021-10-06: 40 mg via INTRAVENOUS

## 2021-10-06 MED ORDER — PROPOFOL 10 MG/ML IV BOLUS
INTRAVENOUS | Status: DC | PRN
Start: 2021-10-06 — End: 2021-10-06
  Administered 2021-10-06: 100 mg via INTRAVENOUS

## 2021-10-06 MED ORDER — PROPOFOL 500 MG/50ML IV EMUL
INTRAVENOUS | Status: DC | PRN
  Administered 2021-10-06: 150 ug/kg/min via INTRAVENOUS

## 2021-10-06 NOTE — Anesthesia Preprocedure Evaluation (Signed)
Anesthesia Evaluation  ?Patient identified by MRN, date of birth, ID band ?Patient awake ? ? ? ?Reviewed: ?Allergy & Precautions, H&P , NPO status , Patient's Chart, lab work & pertinent test results, reviewed documented beta blocker date and time  ? ?History of Anesthesia Complications ?Negative for: history of anesthetic complications ? ?Airway ?Mallampati: II ? ?TM Distance: >3 FB ?Neck ROM: full ? ? ? Dental ? ?(+) Dental Advidsory Given, Teeth Intact ?Lower permanent retainer:   ?Pulmonary ?neg pulmonary ROS,  ?  ?Pulmonary exam normal ?breath sounds clear to auscultation ? ? ? ? ? ? Cardiovascular ?Exercise Tolerance: Good ?negative cardio ROS ?Normal cardiovascular exam ?Rhythm:regular Rate:Normal ? ? ?  ?Neuro/Psych ?PSYCHIATRIC DISORDERS Anxiety negative neurological ROS ?   ? GI/Hepatic ?Neg liver ROS, GERD  ,  ?Endo/Other  ?negative endocrine ROS ? Renal/GU ?Renal disease (kidney stones)  ?negative genitourinary ?  ?Musculoskeletal ? ? Abdominal ?  ?Peds ? Hematology ?negative hematology ROS ?(+)   ?Anesthesia Other Findings ?Past Medical History: ?No date: Complication of anesthesia ?No date: GERD (gastroesophageal reflux disease) ?    Comment:  with preg, on zantac ?No date: Nephrolithiasis ?    Comment:  no surgery required ?No date: PCOS (polycystic ovarian syndrome) ?No date: Tachycardia ?    Comment:  RVOF ? ? Reproductive/Obstetrics ?negative OB ROS ? ?  ? ? ? ? ? ? ? ? ? ? ? ? ? ?  ?  ? ? ? ? ? ? ? ? ?Anesthesia Physical ?Anesthesia Plan ? ?ASA: 2 ? ?Anesthesia Plan: General  ? ?Post-op Pain Management:   ? ?Induction: Intravenous ? ?PONV Risk Score and Plan: 3 and Propofol infusion and TIVA ? ?Airway Management Planned: Natural Airway and Nasal Cannula ? ?Additional Equipment:  ? ?Intra-op Plan:  ? ?Post-operative Plan:  ? ?Informed Consent: I have reviewed the patients History and Physical, chart, labs and discussed the procedure including the risks, benefits  and alternatives for the proposed anesthesia with the patient or authorized representative who has indicated his/her understanding and acceptance.  ? ? ? ?Dental Advisory Given ? ?Plan Discussed with: Anesthesiologist, CRNA and Surgeon ? ?Anesthesia Plan Comments:   ? ? ? ? ? ? ?Anesthesia Quick Evaluation ? ?

## 2021-10-06 NOTE — Anesthesia Postprocedure Evaluation (Signed)
Anesthesia Post Note ? ?Patient: Tanya Meza ? ?Procedure(s) Performed: ESOPHAGOGASTRODUODENOSCOPY (EGD) ? ?Patient location during evaluation: Endoscopy ?Anesthesia Type: General ?Level of consciousness: awake and alert ?Pain management: pain level controlled ?Vital Signs Assessment: post-procedure vital signs reviewed and stable ?Respiratory status: spontaneous breathing, nonlabored ventilation, respiratory function stable and patient connected to nasal cannula oxygen ?Cardiovascular status: blood pressure returned to baseline and stable ?Postop Assessment: no apparent nausea or vomiting ?Anesthetic complications: no ? ? ?No notable events documented. ? ? ?Last Vitals:  ?Vitals:  ? 10/06/21 0856 10/06/21 0923  ?BP: 118/68 119/74  ?Pulse: 66   ?Resp: 11   ?Temp:  (!) 35.8 ?C  ?SpO2: 99%   ?  ?Last Pain:  ?Vitals:  ? 10/06/21 0923  ?TempSrc: Temporal  ?PainSc: 0-No pain  ? ? ?  ?  ?  ?  ?  ?  ? ?Lenard Simmer ? ? ? ? ?

## 2021-10-06 NOTE — Op Note (Signed)
[facility] ?Gastroenterology ?Patient Name: Tanya Meza ?Procedure Date: 10/06/2021 8:21 AM ?MRN: [id number] ?Account #: [id number] ?Date of Birth: 1981/06/07 ?Admit Type: Outpatient ?Age: 41 ?Room: [facility] ENDO ROOM 1 ?Gender: Female ?Note Status: Finalized ?Instrument Name: Upper Endoscope [id number] ?Procedure:             Upper GI endoscopy ?Indications:           Dysphagia, Heartburn, Suspected esophageal reflux ?Providers:             Annamaria Helling DO, DO ?Medicines:             Monitored Anesthesia Care ?Complications:         No immediate complications. Estimated blood loss:  ?                       Minimal. ?Procedure:             Pre-Anesthesia Assessment: ?                       - Prior to the procedure, a History and Physical was  ?                       performed, and patient medications and allergies were  ?                       reviewed. The patient is competent. The risks and  ?                       benefits of the procedure and the sedation options and  ?                       risks were discussed with the patient. All questions  ?                       were answered and informed consent was obtained.  ?                       Patient identification and proposed procedure were  ?                       verified by the physician, the nurse, the anesthetist  ?                       and the technician in the endoscopy suite. Mental  ?                       Status Examination: alert and oriented. Airway  ?                       Examination: normal oropharyngeal airway and neck  ?                       mobility. Respiratory Examination: clear to  ?                       auscultation. CV Examination: RRR, no murmurs, no S3  ?                       or S4. Prophylactic Antibiotics: The  patient does not  ?                       require prophylactic antibiotics. Prior  ?                       Anticoagulants: The patient has taken no previous  ?                       anticoagulant or  antiplatelet agents. ASA Grade  ?                       Assessment: II - A patient with mild systemic disease.  ?                       After reviewing the risks and benefits, the patient  ?                       was deemed in satisfactory condition to undergo the  ?                       procedure. The anesthesia plan was to use monitored  ?                       anesthesia care (MAC). Immediately prior to  ?                       administration of medications, the patient was  ?                       re-assessed for adequacy to receive sedatives. The  ?                       heart rate, respiratory rate, oxygen saturations,  ?                       blood pressure, adequacy of pulmonary ventilation, and  ?                       response to care were monitored throughout the  ?                       procedure. The physical status of the patient was  ?                       re-assessed after the procedure. ?                       After obtaining informed consent, the endoscope was  ?                       passed under direct vision. Throughout the procedure,  ?                       the patient's blood pressure, pulse, and oxygen  ?                       saturations were monitored continuously. The Endoscope  ?  was introduced through the mouth, and advanced to the  ?                       second part of duodenum. The upper GI endoscopy was  ?                       accomplished without difficulty. The patient tolerated  ?                       the procedure well. ?Findings: ?     The duodenal bulb, first portion of the duodenum and second portion of  ?     the duodenum were normal. Estimated blood loss: none. ?     A large amount of food (residue) was found in the gastric body and on  ?     the greater curvature of the stomach. Precludes visualization of a large  ?     portion of gastric mucosa. Visualized mucosa appeared normal. Did not  ?     retroflex to prevent urge to cough/vomit. Biopsies  were taken with a  ?     cold forceps for Helicobacter pylori testing. Estimated blood loss was  ?     minimal. ?     The Z-line was irregular. Biopsies were taken with a cold forceps for  ?     histology. C0M2 tongue of salmon colored mucosa. Estimated blood loss  ?     was minimal. ?     Normal mucosa was found in the entire esophagus. No signs of stricture  ?     or narrowing. Estimated blood loss: none. ?Impression:            - Normal duodenal bulb, first portion of the duodenum  ?                       and second portion of the duodenum. ?                       - A large amount of food (residue) in the stomach.  ?                       Biopsied. ?                       - Z-line irregular. Biopsied. ?                       - Normal mucosa was found in the entire esophagus. ?Recommendation:        - Discharge patient to home. ?                       - Resume previous diet. ?                       - Continue present medications. ?                       - Await pathology results. ?                       - Recommend colonoscopy given symptoms discussed in  ?  pre-op area. ?                       - Return to GI clinic as previously scheduled. ?                       - The findings and recommendations were discussed with  ?                       the patient. ?Procedure Code(s):     --- Professional --- ?                       [phone], Esophagogastroduodenoscopy, flexible,  ?                       transoral; with biopsy, single or multiple ?Diagnosis Code(s):     --- Professional --- ?                       K22.8, Other specified diseases of esophagus ?                       R13.10, Dysphagia, unspecified ?                       R12, Heartburn ?CPT copyright 2019 American Medical Association. All rights reserved. ?The codes documented in this report are preliminary and upon coder review may  ?be revised to meet current compliance requirements. ?Attending Participation: ?     I personally performed  the entire procedure. ?Volney American, DO ?Annamaria Helling DO, DO ?10/06/2021 8:53:48 AM ?This report has been signed electronically. ?Number of Addenda: 0 ?Note Initiated On: 10/06/2021 8:21 AM ?Estimated Blood Loss:  Estimated blood loss was minimal. ?     [facility] ?

## 2021-10-06 NOTE — Transfer of Care (Signed)
Immediate Anesthesia Transfer of Care Note ? ?Patient: Tanya Meza ? ?Procedure(s) Performed: ESOPHAGOGASTRODUODENOSCOPY (EGD) ? ?Patient Location: PACU ? ?Anesthesia Type:General ? ?Level of Consciousness: drowsy ? ?Airway & Oxygen Therapy: Patient Spontanous Breathing ? ?Post-op Assessment: Report given to RN and Post -op Vital signs reviewed and stable ? ?Post vital signs: Reviewed and stable ? ?Last Vitals:  ?Vitals Value Taken Time  ?BP 118/68 10/06/21 0856  ?Temp    ?Pulse 66 10/06/21 0856  ?Resp 11 10/06/21 0856  ?SpO2 99 % 10/06/21 0856  ? ? ?Last Pain:  ?Vitals:  ? 10/06/21 0854  ?TempSrc:   ?PainSc: 0-No pain  ?   ? ?  ? ?Complications: No notable events documented. ?

## 2021-10-06 NOTE — Interval H&P Note (Signed)
History and Physical Interval Note: Preprocedure H&P from 10/06/21 ? was reviewed and there was no interval change after seeing and examining the patient.  Written consent was obtained from the patient after discussion of risks, benefits, and alternatives. Patient has consented to proceed with Esophagogastroduodenoscopy with possible intervention ? ? ?10/06/2021 ?8:39 AM ? ?Tanya Meza  has presented today for surgery, with the diagnosis of Abdominal pain, epigastric (R10.13) ?Gastroesophageal reflux disease without esophagitis (K21.9) ?Dysphagia, unspecified type (R13.10).  The various methods of treatment have been discussed with the patient and family. After consideration of risks, benefits and other options for treatment, the patient has consented to  Procedure(s): ?ESOPHAGOGASTRODUODENOSCOPY (EGD) (N/A) as a surgical intervention.  The patient's history has been reviewed, patient examined, no change in status, stable for surgery.  I have reviewed the patient's chart and labs.  Questions were answered to the patient's satisfaction.   ? ? ?[name] ? ? ?

## 2021-10-06 NOTE — Anesthesia Procedure Notes (Signed)
Date/Time: 10/06/2021 8:50 AM ?Performed by: Johnna Acosta, CRNA ?Pre-anesthesia Checklist: Patient identified, Patient being monitored, Timeout performed, Emergency Drugs available and Suction available ?Patient Re-evaluated:Patient Re-evaluated prior to induction ?Oxygen Delivery Method: Nasal cannula ?Preoxygenation: Pre-oxygenation with 100% oxygen ?Induction Type: IV induction ? ? ? ? ?

## 2021-10-09 ENCOUNTER — Encounter: Payer: Self-pay | Admitting: Gastroenterology

## 2021-10-09 ENCOUNTER — Other Ambulatory Visit: Payer: Self-pay | Admitting: *Deleted

## 2021-10-09 LAB — SURGICAL PATHOLOGY

## 2021-10-09 MED ORDER — FLUCONAZOLE 150 MG PO TABS: 150.0000 mg | ORAL_TABLET | Freq: Once | ORAL | 1 refills | Status: AC

## 2021-11-16 NOTE — Progress Notes (Unsigned)
I,Sha'taria Tyson,acting as a Neurosurgeon for [employer], PA-C.,have documented all relevant documentation on the behalf of Alfredia Ferguson, PA-C,as directed by  Alfredia Ferguson, PA-C while in the presence of Alfredia Ferguson, PA-C.   Complete physical exam   Patient: Tanya Meza   DOB: [birth year]   41 y.o. Female  MRN: [id number] Visit Date: 11/17/2021  Today's healthcare provider: Alfredia Ferguson, PA-C   No chief complaint on file.  Subjective    Tanya Meza is a 41 y.o. female who presents today for a complete physical exam.  She reports consuming a {diet types:17450} diet. {Exercise:19826} She generally feels {well/fairly well/poorly:18703}. She reports sleeping {well/fairly well/poorly:18703}. She {does/does not:200015} have additional problems to discuss today.  HPI  -Hep C Screening?  Past Medical History:  Diagnosis Date   Complication of anesthesia    GERD (gastroesophageal reflux disease)    with preg, on zantac   Nephrolithiasis    no surgery required   PCOS (polycystic ovarian syndrome)    Tachycardia    RVOF   Past Surgical History:  Procedure Laterality Date   CESAREAN SECTION  2005, 2008   x3   CESAREAN SECTION WITH BILATERAL TUBAL LIGATION     DILITATION & CURRETTAGE/HYSTROSCOPY WITH HYDROTHERMAL ABLATION N/A 05/06/2019   Procedure: DILATATION & CURETTAGE/HYSTEROSCOPY WITH HYDROTHERMAL ABLATION;  Surgeon: Reva Bores, MD;  Location: [facility];  Service: Gynecology;  Laterality: N/A;   ESOPHAGOGASTRODUODENOSCOPY N/A 10/06/2021   Procedure: ESOPHAGOGASTRODUODENOSCOPY (EGD);  Surgeon: [name], DO;  Location: [facility] ENDOSCOPY;  Service: Gastroenterology;  Laterality: N/A;   WISDOM TOOTH EXTRACTION     Social History   Socioeconomic History   Marital status: Married    Spouse name: Not on file   Number of children: 3   Years of education: Not on file   Highest education level: Not on file  Occupational History     Employer: [employer]  Tobacco Use   Smoking status: Never   Smokeless tobacco: Never  Vaping Use   Vaping Use: Never used  Substance and Sexual Activity   Alcohol use: Yes    Comment: social   Drug use: No   Sexual activity: Yes    Partners: Male    Birth control/protection: Surgical    Comment: tubal ligation.  Other Topics Concern   Not on file  Social History Narrative   Not on file   Social Determinants of Health   Financial Resource Strain: Not on file  Food Insecurity: Not on file  Transportation Needs: Not on file  Physical Activity: Not on file  Stress: Not on file  Social Connections: Not on file  Intimate Partner Violence: Not on file   Family Status  Relation Name Status   Mother  Alive   Father  Alive   Brother 1 Alive   MGF  Deceased   PGF  Deceased   Brother 2 Alive   Brother 3 Alive   Daughter  Alive   Son  Alive   Daughter  Alive   Mat Aunt  (Not Specified)   Neg Hx  (Not Specified)   Family History  Problem Relation Age of Onset   Thyroid disease Mother    Hyperlipidemia Mother    Hypothyroidism Mother    Tongue cancer Mother    Diabetes Father    Hyperlipidemia Father    Hypertension Brother    Heart attack Maternal Grandfather    Alcohol abuse Paternal Grandfather    Heart attack  Paternal Grandfather    Hyperlipidemia Brother    Breast cancer Maternal Aunt    Anesthesia problems Neg Hx    Colon cancer Neg Hx    Allergies  Allergen Reactions   Latex Swelling    Swelling- around catheter    Patient Care Team: Alfredia Ferguson, PA-C as PCP - General (Physician Assistant)   Medications: Outpatient Medications Prior to Visit  Medication Sig   omeprazole (PRILOSEC) 40 MG capsule Take 1 capsule (40 mg total) by mouth daily.   No facility-administered medications prior to visit.    Review of Systems  {Labs  Heme  Chem  Endocrine  Serology  Results Review (optional):23779}  Objective    There were no vitals taken for this  visit. {Show previous vital signs (optional):23777}   Physical Exam  ***  Last depression screening scores    09/09/2020    9:03 AM 03/03/2020   11:53 AM 04/03/2019    9:20 AM  PHQ 2/9 Scores  PHQ - 2 Score 0 0 0  PHQ- 9 Score 0 0 0   Last fall risk screening    09/09/2020    9:03 AM  Fall Risk   Falls in the past year? 0  Number falls in past yr: 0  Injury with Fall? 0  Risk for fall due to : No Fall Risks  Follow up Falls evaluation completed   Last Audit-C alcohol use screening    09/09/2020    9:04 AM  Alcohol Use Disorder Test (AUDIT)  1. How often do you have a drink containing alcohol? 2  2. How many drinks containing alcohol do you have on a typical day when you are drinking? 0  3. How often do you have six or more drinks on one occasion? 0  AUDIT-C Score 2  Alcohol Brief Interventions/Follow-up AUDIT Score <7 follow-up not indicated   A score of 3 or more in women, and 4 or more in men indicates increased risk for alcohol abuse, EXCEPT if all of the points are from question 1   No results found for any visits on 11/17/21.  Assessment & Plan    Routine Health Maintenance and Physical Exam  Exercise Activities and Dietary recommendations  Goals      Exercise 150 minutes per week (moderate activity)        Immunization History  Administered Date(s) Administered   Influenza Split 04/24/2011, 08/05/2012   Influenza,inj,Quad PF,6+ Mos 04/02/2017, 04/02/2018, 03/04/2019   PPD Test 07/23/2011   Tdap 05/16/2011    Health Maintenance  Topic Date Due   COVID-19 Vaccine (1) Never done   Hepatitis C Screening  Never done   TETANUS/TDAP  05/15/2021   PAP SMEAR-Modifier  12/29/2021   INFLUENZA VACCINE  01/16/2022   HIV Screening  Completed   HPV VACCINES  Aged Out    Discussed health benefits of physical activity, and encouraged her to engage in regular exercise appropriate for her age and condition.  ***  No follow-ups on file.     {provider  attestation***:1}   Alfredia Ferguson, PA-C  [facility] [phone] (phone) [phone] (fax)  [facility]

## 2021-11-17 ENCOUNTER — Encounter: Payer: Self-pay | Admitting: Physician Assistant

## 2021-11-17 ENCOUNTER — Ambulatory Visit (INDEPENDENT_AMBULATORY_CARE_PROVIDER_SITE_OTHER): Payer: 59 | Admitting: Physician Assistant

## 2021-11-17 VITALS — BP 116/72 | HR 87 | Ht 65.0 in | Wt 188.5 lb

## 2021-11-17 DIAGNOSIS — Z23 Encounter for immunization: Secondary | ICD-10-CM

## 2021-11-17 DIAGNOSIS — R519 Headache, unspecified: Secondary | ICD-10-CM

## 2021-11-17 DIAGNOSIS — Z1159 Encounter for screening for other viral diseases: Secondary | ICD-10-CM

## 2021-11-17 DIAGNOSIS — R238 Other skin changes: Secondary | ICD-10-CM

## 2021-11-17 DIAGNOSIS — Z1231 Encounter for screening mammogram for malignant neoplasm of breast: Secondary | ICD-10-CM

## 2021-11-17 DIAGNOSIS — Z0000 Encounter for general adult medical examination without abnormal findings: Secondary | ICD-10-CM | POA: Diagnosis not present

## 2021-11-17 DIAGNOSIS — Z8249 Family history of ischemic heart disease and other diseases of the circulatory system: Secondary | ICD-10-CM

## 2021-11-17 NOTE — Assessment & Plan Note (Signed)
Advised pt to track when they occur-triggered by food, dehydration, overwork Can take tylneol and advil if needed or an excedrin migraine when they occur.  If increase in frequency or severity can re-evaluate

## 2021-11-17 NOTE — Assessment & Plan Note (Signed)
Advised warm compresses

## 2021-11-18 LAB — TSH+FREE T4
Free T4: 1.22 ng/dL (ref 0.82–1.77)
TSH: 1.82 u[IU]/mL (ref 0.450–4.500)

## 2021-11-18 LAB — COMPREHENSIVE METABOLIC PANEL
ALT: 14 IU/L (ref 0–32)
AST: 14 IU/L (ref 0–40)
Albumin/Globulin Ratio: 2 (ref 1.2–2.2)
Albumin: 4.7 g/dL (ref 3.8–4.8)
Alkaline Phosphatase: 62 IU/L (ref 44–121)
BUN/Creatinine Ratio: 15 (ref 9–23)
BUN: 12 mg/dL (ref 6–24)
Bilirubin Total: 0.3 mg/dL (ref 0.0–1.2)
CO2: 24 mmol/L (ref 20–29)
Calcium: 9.5 mg/dL (ref 8.7–10.2)
Chloride: 102 mmol/L (ref 96–106)
Creatinine, Ser: 0.8 mg/dL (ref 0.57–1.00)
Globulin, Total: 2.4 g/dL (ref 1.5–4.5)
Glucose: 66 mg/dL — ABNORMAL LOW (ref 70–99)
Potassium: 4.4 mmol/L (ref 3.5–5.2)
Sodium: 140 mmol/L (ref 134–144)
Total Protein: 7.1 g/dL (ref 6.0–8.5)
eGFR: 94 mL/min/{1.73_m2} (ref >59)

## 2021-11-18 LAB — CBC WITH DIFFERENTIAL/PLATELET
Basophils Absolute: 0 10*3/uL (ref 0.0–0.2)
Basos: 0 %
EOS (ABSOLUTE): 0.3 10*3/uL (ref 0.0–0.4)
Eos: 4 %
Hematocrit: 41.9 % (ref 34.0–46.6)
Hemoglobin: 14.1 g/dL (ref 11.1–15.9)
Immature Grans (Abs): 0 10*3/uL (ref 0.0–0.1)
Immature Granulocytes: 0 %
Lymphocytes Absolute: 1.8 10*3/uL (ref 0.7–3.1)
Lymphs: 21 %
MCH: 28.6 pg (ref 26.6–33.0)
MCHC: 33.7 g/dL (ref 31.5–35.7)
MCV: 85 fL (ref 79–97)
Monocytes Absolute: 0.5 10*3/uL (ref 0.1–0.9)
Monocytes: 6 %
Neutrophils Absolute: 6.2 10*3/uL (ref 1.4–7.0)
Neutrophils: 69 %
Platelets: 355 10*3/uL (ref 150–450)
RBC: 4.93 x10E6/uL (ref 3.77–5.28)
RDW: 13.2 % (ref 11.7–15.4)
WBC: 8.9 10*3/uL (ref 3.4–10.8)

## 2021-11-18 LAB — LIPID PANEL
Chol/HDL Ratio: 4.3 ratio (ref 0.0–4.4)
Cholesterol, Total: 184 mg/dL (ref 100–199)
HDL: 43 mg/dL (ref >39)
LDL Chol Calc (NIH): 122 mg/dL — ABNORMAL HIGH (ref 0–99)
Triglycerides: 103 mg/dL (ref 0–149)
VLDL Cholesterol Cal: 19 mg/dL (ref 5–40)

## 2021-11-18 LAB — HEPATITIS C ANTIBODY: Hep C Virus Ab: NONREACTIVE

## 2021-11-18 LAB — HEMOGLOBIN A1C
Est. average glucose Bld gHb Est-mCnc: 105 mg/dL
Hgb A1c MFr Bld: 5.3 % (ref 4.8–5.6)

## 2021-11-22 ENCOUNTER — Other Ambulatory Visit (HOSPITAL_COMMUNITY)
Admission: RE | Admit: 2021-11-22 | Discharge: 2021-11-22 | Disposition: A | Discharge status: Home / Self Care | Payer: 59 | Source: Ambulatory Visit | Attending: Family Medicine | Admitting: Family Medicine

## 2021-11-22 ENCOUNTER — Encounter: Payer: Self-pay | Admitting: Family Medicine

## 2021-11-22 ENCOUNTER — Ambulatory Visit (INDEPENDENT_AMBULATORY_CARE_PROVIDER_SITE_OTHER): Payer: 59 | Admitting: Family Medicine

## 2021-11-22 VITALS — BP 115/63 | HR 71 | Ht 65.0 in | Wt 188.0 lb

## 2021-11-22 DIAGNOSIS — N941 Unspecified dyspareunia: Secondary | ICD-10-CM

## 2021-11-22 DIAGNOSIS — N898 Other specified noninflammatory disorders of vagina: Secondary | ICD-10-CM | POA: Insufficient documentation

## 2021-11-22 DIAGNOSIS — Z124 Encounter for screening for malignant neoplasm of cervix: Secondary | ICD-10-CM

## 2021-11-22 DIAGNOSIS — Z1231 Encounter for screening mammogram for malignant neoplasm of breast: Secondary | ICD-10-CM | POA: Diagnosis not present

## 2021-11-22 DIAGNOSIS — Z01419 Encounter for gynecological examination (general) (routine) without abnormal findings: Secondary | ICD-10-CM

## 2021-11-22 NOTE — Patient Instructions (Signed)

## 2021-11-22 NOTE — Progress Notes (Signed)
Subjective:     Tanya Meza is a 41 y.o. female and is here for a comprehensive physical exam. The patient reports problems - normal regular cycles, she is s/p endometrial ablation and finds cycles are mostly tolerable. She reports recurrent yeast--has had to try to use diflucan on more than on occasion over last few weeks. She does have a hot tub. She notes some discomfort with intercourse and irritation .   The following portions of the patient's history were reviewed and updated as appropriate: allergies, current medications, past family history, past medical history, past social history, past surgical history, and problem list.  Review of Systems Pertinent items noted in HPI and remainder of comprehensive ROS otherwise negative.   Objective:    BP 115/63   Pulse 71   Ht 5\' 5"  (1.651 m)   Wt 188 lb (85.3 kg)   BMI 31.28 kg/m  General appearance: alert, cooperative, and appears stated age Head: Normocephalic, without obvious abnormality, atraumatic Neck: no adenopathy, supple, symmetrical, trachea midline, and thyroid not enlarged, symmetric, no tenderness/mass/nodules Lungs: clear to auscultation bilaterally Breasts: normal appearance, no masses or tenderness Heart: regular rate and rhythm, S1, S2 normal, no murmur, click, rub or gallop Abdomen: soft, non-tender; bowel sounds normal; no masses,  no organomegaly Pelvic: cervix normal in appearance, external genitalia normal, no adnexal masses or tenderness, no cervical motion tenderness, uterus normal size, shape, and consistency, and vagina normal without discharge Extremities: extremities normal, atraumatic, no cyanosis or edema Pulses: 2+ and symmetric Skin: Skin color, texture, turgor normal. No rashes or lesions Lymph nodes: Cervical, supraclavicular, and axillary nodes normal. Neurologic: Grossly normal    Assessment:    Healthy female exam.      Plan:  Screening for malignant neoplasm of cervix [id number] - Plan:  Cytology - PAP  Encounter for gynecological examination without abnormal finding [id number] - recent labs with PCP  Encounter for screening mammogram for malignant neoplasm of breast [id number] - Plan: MM 3D SCREEN BREAST BILATERAL  Vaginal discharge - [id number] - check wet prep and treat accordingly - Plan: Cervicovaginal ancillary only( [place])  Dyspareunia in female - 3 - have female partner trim hair, water based lube.  Return in 1 year (on 11/23/2022).    See After Visit Summary for Counseling Recommendations

## 2021-11-22 NOTE — Progress Notes (Signed)
Patient presents for Annual Exam today.  LMP: End of April Early May per pt  Last Pap: 12/30/2018 WNL Last Mammogram: 12/27/2020 WNL  STD Screening:Declines  Family Hx of Breast Cancer: Yes Great Maternal  Aunt  Family Hx of Ovarian Cancer: None   Currently taking injections for weight loss. *Pt has colonoscopy scheduled for July   CC: possible yeast infection still after tx.

## 2021-11-23 LAB — CERVICOVAGINAL ANCILLARY ONLY
Bacterial Vaginitis (gardnerella): NEGATIVE
Candida Glabrata: NEGATIVE
Candida Vaginitis: NEGATIVE
Comment: NEGATIVE
Comment: NEGATIVE
Comment: NEGATIVE

## 2021-11-24 LAB — CYTOLOGY - PAP
Adequacy: ABSENT
Comment: NEGATIVE
Diagnosis: NEGATIVE
Diagnosis: REACTIVE
High risk HPV: NEGATIVE

## 2021-12-21 ENCOUNTER — Encounter: Payer: Self-pay | Admitting: Gastroenterology

## 2021-12-21 NOTE — H&P (Signed)
Pre-Procedure H&P   Patient ID: Tanya Meza is a 41 y.o. female.  Gastroenterology Provider: [name], DO  PCP: Alfredia Ferguson, PA-C  Date: 12/22/2021  HPI Ms. Tanya Meza is a 41 y.o. female who presents today for Esophagogastroduodenoscopy and Colonoscopy for Dysphagia, epigastric discomfort, GERD; change in bowel habits and constipation. Patient with epigastric pain and reflux.  Epigastric pain waxes and wanes which prompted a ED visit in November.  Xiphoid sticking sensation when swallowing food specifically solids has resolved.  No issues with liquids or pills or odynophagia.  Weight and appetite have been stable and currently taking omeprazole. Also on ozempic. EGD performed in April demonstrating food in the stomach limiting visualization and preventing retroflexion.  Biopsies were negative for H. pylori and Barrett's esophagus but gastritis was also noted endoscopically and by pathology.  Patient having bowel movement approximately 1 time per week without melena or hematochezia if not using miralax.  She is taking MiraLAX 1-3 times per week which has improved her BM frequency to 3 times per week Status post C-section x3.  History of PCOS Most recent lab work creatinine 0.8 ALT and AST 14 total bili 0.3 hemoglobin 14.1 MCV 85 platelets 355,000 A1c 5.3  January ultrasound demonstrating fatty liver disease with normal gallbladder  Past Medical History:  Diagnosis Date   Complication of anesthesia    GERD (gastroesophageal reflux disease)    with preg, on zantac   Nephrolithiasis    no surgery required   PCOS (polycystic ovarian syndrome)    Tachycardia    RVOF    Past Surgical History:  Procedure Laterality Date   CESAREAN SECTION  2005, 2008   x3   CESAREAN SECTION WITH BILATERAL TUBAL LIGATION     DILITATION & CURRETTAGE/HYSTROSCOPY WITH HYDROTHERMAL ABLATION N/A 05/06/2019   Procedure: DILATATION & CURETTAGE/HYSTEROSCOPY WITH HYDROTHERMAL  ABLATION;  Surgeon: Reva Bores, MD;  Location: [facility];  Service: Gynecology;  Laterality: N/A;   ESOPHAGOGASTRODUODENOSCOPY N/A 10/06/2021   Procedure: ESOPHAGOGASTRODUODENOSCOPY (EGD);  Surgeon: [name], DO;  Location: [facility] ENDOSCOPY;  Service: Gastroenterology;  Laterality: N/A;   WISDOM TOOTH EXTRACTION      Family History No h/o GI disease or malignancy  Review of Systems  Constitutional:  Negative for activity change, appetite change, chills, diaphoresis, fatigue, fever and unexpected weight change.  HENT:  Positive for trouble swallowing. Negative for voice change.   Respiratory:  Negative for shortness of breath and wheezing.   Cardiovascular:  Negative for chest pain, palpitations and leg swelling.  Gastrointestinal:  Positive for abdominal distention, abdominal pain and constipation. Negative for anal bleeding, blood in stool, diarrhea, nausea, rectal pain and vomiting.  Musculoskeletal:  Negative for arthralgias and myalgias.  Skin:  Negative for color change and pallor.  Neurological:  Negative for dizziness, syncope and weakness.  Psychiatric/Behavioral:  Negative for confusion.   All other systems reviewed and are negative.    Medications No current facility-administered medications on file prior to encounter.   Current Outpatient Medications on File Prior to Encounter  Medication Sig Dispense Refill   omeprazole (PRILOSEC) 40 MG capsule Take 1 capsule (40 mg total) by mouth daily. 90 capsule 3    Pertinent medications related to GI and procedure were reviewed by me with the patient prior to the procedure   Current Facility-Administered Medications:    0.9 %  sodium chloride infusion, , Intravenous, Continuous, [name], DO, Last Rate: 20 mL/hr at 12/22/21 0946, New  Bag at 12/22/21 0946      Allergies  Allergen Reactions   Latex Swelling    Swelling- around catheter   Allergies were reviewed by me prior to the  procedure  Objective   Body mass index is 29.79 kg/m. Vitals:   12/22/21 0928  BP: 122/81  Pulse: 71  Resp: 16  Temp: (!) 96.9 F (36.1 C)  TempSrc: Temporal  SpO2: 99%  Weight: 81.2 kg  Height: 5\' 5"  (1.651 m)     Physical Exam Vitals and nursing note reviewed.  Constitutional:      General: She is not in acute distress.    Appearance: Normal appearance. She is not ill-appearing, toxic-appearing or diaphoretic.  HENT:     Head: Normocephalic and atraumatic.     Nose: Nose normal.     Mouth/Throat:     Mouth: Mucous membranes are moist.     Pharynx: Oropharynx is clear.  Eyes:     General: No scleral icterus.    Extraocular Movements: Extraocular movements intact.  Cardiovascular:     Rate and Rhythm: Normal rate and regular rhythm.     Heart sounds: Normal heart sounds. No murmur heard.    No friction rub. No gallop.  Pulmonary:     Effort: Pulmonary effort is normal. No respiratory distress.     Breath sounds: Normal breath sounds. No wheezing, rhonchi or rales.  Abdominal:     General: Bowel sounds are normal. There is no distension.     Palpations: Abdomen is soft.     Tenderness: There is no abdominal tenderness. There is no guarding or rebound.  Musculoskeletal:     Cervical back: Neck supple.     Right lower leg: No edema.     Left lower leg: No edema.  Skin:    General: Skin is warm and dry.     Coloration: Skin is not jaundiced or pale.  Neurological:     General: No focal deficit present.     Mental Status: She is alert and oriented to person, place, and time. Mental status is at baseline.  Psychiatric:        Mood and Affect: Mood normal.        Behavior: Behavior normal.        Thought Content: Thought content normal.        Judgment: Judgment normal.      Assessment:  Ms. Tanya Meza is a 41 y.o. female  who presents today for Esophagogastroduodenoscopy and Colonoscopy for Dysphagia, epigastric discomfort, GERD; change in bowel habits  and constipation.  Plan:  Esophagogastroduodenoscopy and Colonoscopy with possible intervention today  Esophagogastroduodenoscopy and Colonoscopy with possible biopsy, control of bleeding, polypectomy, and interventions as necessary has been discussed with the patient/patient representative. Informed consent was obtained from the patient/patient representative after explaining the indication, nature, and risks of the procedure including but not limited to death, bleeding, perforation, missed neop lasm/lesions, cardiorespiratory compromise, and reaction to medications. Opportunity for questions was given and appropriate answers were provided. Patient/patient representative has verbalized understanding is amenable to undergoing the procedure.   53, DO  [facility] Gastroenterology  Portions of the record may have been created with voice recognition software. Occasional wrong-word or 'sound-a-like' substitutions may have occurred due to the inherent limitations of voice recognition software.  Read the chart carefully and recognize, using context, where substitutions may have occurred.

## 2021-12-22 ENCOUNTER — Encounter
Admission: RE | Disposition: A | Discharge status: Home / Self Care | Payer: Self-pay | Source: Home / Self Care | Attending: Gastroenterology

## 2021-12-22 ENCOUNTER — Ambulatory Visit: Payer: 59 | Admitting: Certified Registered"

## 2021-12-22 ENCOUNTER — Ambulatory Visit
Admission: RE | Admit: 2021-12-22 | Discharge: 2021-12-22 | Disposition: A | Discharge status: Home / Self Care | Payer: 59 | Attending: Gastroenterology | Admitting: Gastroenterology

## 2021-12-22 ENCOUNTER — Encounter: Payer: Self-pay | Admitting: Gastroenterology

## 2021-12-22 DIAGNOSIS — R1013 Epigastric pain: Secondary | ICD-10-CM | POA: Insufficient documentation

## 2021-12-22 DIAGNOSIS — R1310 Dysphagia, unspecified: Secondary | ICD-10-CM | POA: Diagnosis not present

## 2021-12-22 DIAGNOSIS — K5900 Constipation, unspecified: Secondary | ICD-10-CM | POA: Diagnosis not present

## 2021-12-22 DIAGNOSIS — K219 Gastro-esophageal reflux disease without esophagitis: Secondary | ICD-10-CM | POA: Insufficient documentation

## 2021-12-22 DIAGNOSIS — K317 Polyp of stomach and duodenum: Secondary | ICD-10-CM | POA: Insufficient documentation

## 2021-12-22 DIAGNOSIS — R1084 Generalized abdominal pain: Secondary | ICD-10-CM | POA: Diagnosis not present

## 2021-12-22 DIAGNOSIS — K449 Diaphragmatic hernia without obstruction or gangrene: Secondary | ICD-10-CM | POA: Diagnosis not present

## 2021-12-22 DIAGNOSIS — K5730 Diverticulosis of large intestine without perforation or abscess without bleeding: Secondary | ICD-10-CM | POA: Diagnosis not present

## 2021-12-22 HISTORY — PX: COLONOSCOPY: SHX5424

## 2021-12-22 HISTORY — PX: ESOPHAGOGASTRODUODENOSCOPY: SHX5428

## 2021-12-22 LAB — HM COLONOSCOPY

## 2021-12-22 LAB — POCT PREGNANCY, URINE: Preg Test, Ur: NEGATIVE

## 2021-12-22 SURGERY — COLONOSCOPY
Anesthesia: General

## 2021-12-22 SURGERY — EGD (ESOPHAGOGASTRODUODENOSCOPY)
Anesthesia: General

## 2021-12-22 MED ORDER — LIDOCAINE HCL (PF) 2 % IJ SOLN
INTRAMUSCULAR | Status: AC
  Filled 2021-12-22: qty 5

## 2021-12-22 MED ORDER — PROPOFOL 10 MG/ML IV BOLUS
INTRAVENOUS | Status: DC | PRN
  Administered 2021-12-22: 100 mg via INTRAVENOUS
  Administered 2021-12-22: 130 ug/kg/min via INTRAVENOUS

## 2021-12-22 MED ORDER — SODIUM CHLORIDE 0.9 % IV SOLN: INTRAVENOUS | Status: DC

## 2021-12-22 MED ORDER — PROPOFOL 10 MG/ML IV BOLUS
INTRAVENOUS | Status: AC
  Filled 2021-12-22: qty 20

## 2021-12-22 MED ORDER — LIDOCAINE HCL (CARDIAC) PF 100 MG/5ML IV SOSY
PREFILLED_SYRINGE | INTRAVENOUS | Status: DC | PRN
  Administered 2021-12-22: 100 mg via INTRAVENOUS

## 2021-12-22 MED ORDER — PROPOFOL 10 MG/ML IV BOLUS
INTRAVENOUS | Status: AC
  Filled 2021-12-22: qty 40

## 2021-12-22 NOTE — Op Note (Signed)
[facility] Gastroenterology Patient Name: Tanya Meza Procedure Date: 12/22/2021 9:58 AM MRN: [id number] Account #: [id number] Date of Birth: 1980/08/31 Admit Type: Outpatient Age: 41 Room: [facility] ENDO ROOM 1 Gender: Female Note Status: Finalized Instrument Name: Upper Endoscope [id number] Procedure:             Upper GI endoscopy Indications:           Epigastric abdominal pain, Dyspepsia Providers:             Annamaria Helling DO, DO Referring MD:          No Local Md, MD (Referring MD) Medicines:             Monitored Anesthesia Care Complications:         No immediate complications. Estimated blood loss:                         Minimal. Procedure:             Pre-Anesthesia Assessment:                        - Prior to the procedure, a History and Physical was                         performed, and patient medications and allergies were                         reviewed. The patient is competent. The risks and                         benefits of the procedure and the sedation options and                         risks were discussed with the patient. All questions                         were answered and informed consent was obtained.                         Patient identification and proposed procedure were                         verified by the physician, the nurse, the anesthetist                         and the technician in the endoscopy suite. Mental                         Status Examination: alert and oriented. Airway                         Examination: normal oropharyngeal airway and neck                         mobility. Respiratory Examination: clear to                         auscultation. CV Examination: RRR, no murmurs, no S3  or S4. Prophylactic Antibiotics: The patient does not                         require prophylactic antibiotics. Prior                         Anticoagulants: The patient has taken no previous                          anticoagulant or antiplatelet agents. ASA Grade                         Assessment: II - A patient with mild systemic disease.                         After reviewing the risks and benefits, the patient                         was deemed in satisfactory condition to undergo the                         procedure. The anesthesia plan was to use monitored                         anesthesia care (MAC). Immediately prior to                         administration of medications, the patient was                         re-assessed for adequacy to receive sedatives. The                         heart rate, respiratory rate, oxygen saturations,                         blood pressure, adequacy of pulmonary ventilation, and                         response to care were monitored throughout the                         procedure. The physical status of the patient was                         re-assessed after the procedure.                        After obtaining informed consent, the endoscope was                         passed under direct vision. Throughout the procedure,                         the patient's blood pressure, pulse, and oxygen                         saturations were monitored continuously. The Endoscope  was introduced through the mouth, and advanced to the                         second part of duodenum. The upper GI endoscopy was                         accomplished without difficulty. The patient tolerated                         the procedure well. Findings:      The duodenal bulb, first portion of the duodenum and second portion of       the duodenum were normal. Estimated blood loss was minimal.      A few 1 to 3 mm sessile polyps with no bleeding and no stigmata of       recent bleeding were found on the greater curvature of the stomach and       on the lesser curvature of the stomach. The polyp was removed with a       cold biopsy  forceps. Resection and retrieval were complete. Estimated       blood loss was minimal.      A small hiatal hernia was present. Estimated blood loss: none.      The exam of the stomach was otherwise normal.      The Z-line was regular. Estimated blood loss: none.      Esophagogastric landmarks were identified: the gastroesophageal junction       was found at 35 cm from the incisors.      No endoscopic abnormality was evident in the esophagus to explain the       patient's complaint of dysphagia. Estimated blood loss: none.      The exam of the esophagus was otherwise normal. Impression:            - Normal duodenal bulb, first portion of the duodenum                         and second portion of the duodenum.                        - A few gastric polyps. Resected and retrieved.                        - Small hiatal hernia.                        - Z-line regular.                        - Esophagogastric landmarks identified.                        - No endoscopic esophageal abnormality to explain                         patient's dysphagia. Recommendation:        - Discharge patient to home.                        - Resume previous diet.                        -  Continue present medications.                        - Await pathology results.                        - Return to GI clinic as previously scheduled.                        - proceed with colonoscopy                        - The findings and recommendations were discussed with                         the patient. Procedure Code(s):     --- Professional ---                        [phone], Esophagogastroduodenoscopy, flexible,                         transoral; with biopsy, single or multiple Diagnosis Code(s):     --- Professional ---                        K31.7, Polyp of stomach and duodenum                        K44.9, Diaphragmatic hernia without obstruction or                         gangrene                         R13.10, Dysphagia, unspecified                        R10.13, Epigastric pain CPT copyright 2019 American Medical Association. All rights reserved. The codes documented in this report are preliminary and upon coder review may  be revised to meet current compliance requirements. Attending Participation:      I personally performed the entire procedure. Volney American, DO Annamaria Helling DO, DO 12/22/2021 10:21:53 AM This report has been signed electronically. Number of Addenda: 0 Note Initiated On: 12/22/2021 9:58 AM Estimated Blood Loss:  Estimated blood loss was minimal.      [facility]

## 2021-12-22 NOTE — Anesthesia Postprocedure Evaluation (Signed)
Anesthesia Post Note  Patient: Tanya Meza  Procedure(s) Performed: COLONOSCOPY ESOPHAGOGASTRODUODENOSCOPY (EGD)  Anesthesia Type: General Level of consciousness: awake and awake and alert Pain management: pain level controlled Vital Signs Assessment: post-procedure vital signs reviewed and stable Respiratory status: spontaneous breathing and nonlabored ventilation Cardiovascular status: stable Anesthetic complications: no   No notable events documented.   Last Vitals:  Vitals:   12/22/21 1102 12/22/21 1112  BP: 108/73 96/86  Pulse: 74 74  Resp: 14 16  Temp:    SpO2: 100% 99%    Last Pain:  Vitals:   12/22/21 1112  TempSrc:   PainSc: 0-No pain                 VAN STAVEREN,Kimia Finan

## 2021-12-22 NOTE — Transfer of Care (Signed)
Immediate Anesthesia Transfer of Care Note  Patient: Tanya Meza  Procedure(s) Performed: COLONOSCOPY ESOPHAGOGASTRODUODENOSCOPY (EGD)  Patient Location: PACU  Anesthesia Type:General  Level of Consciousness: drowsy  Airway & Oxygen Therapy: Patient Spontanous Breathing  Post-op Assessment: Report given to RN and Post -op Vital signs reviewed and stable  Post vital signs: Reviewed and stable  Last Vitals:  Vitals Value Taken Time  BP 100/60 12/22/21 1052  Temp 35.9 1052  Pulse 70 12/22/21 1053  Resp 11 12/22/21 1053  SpO2 98 % 12/22/21 1053  Vitals shown include unvalidated device data.  Last Pain:  Vitals:   12/22/21 1052  TempSrc:   PainSc: Asleep         Complications: No notable events documented.

## 2021-12-22 NOTE — Op Note (Signed)
[facility] Gastroenterology Patient Name: Tanya Meza Procedure Date: 12/22/2021 9:57 AM MRN: [id number] Account #: [id number] Date of Birth: 08-08-1980 Admit Type: Outpatient Age: 41 Room: [facility] ENDO ROOM 1 Gender: Female Note Status: Finalized Instrument Name: Jasper Riling [id number] Procedure:             Colonoscopy Indications:           Generalized abdominal pain, Constipation, Incidental                         change in bowel habits noted Providers:             Annamaria Helling DO, DO Referring MD:          No Local Md, MD (Referring MD) Medicines:             Monitored Anesthesia Care Complications:         No immediate complications. Estimated blood loss: None. Procedure:             Pre-Anesthesia Assessment:                        - Prior to the procedure, a History and Physical was                         performed, and patient medications and allergies were                         reviewed. The patient is competent. The risks and                         benefits of the procedure and the sedation options and                         risks were discussed with the patient. All questions                         were answered and informed consent was obtained.                         Patient identification and proposed procedure were                         verified by the physician, the nurse, the anesthetist                         and the technician in the endoscopy suite. Mental                         Status Examination: alert and oriented. Airway                         Examination: normal oropharyngeal airway and neck                         mobility. Respiratory Examination: clear to                         auscultation. CV Examination: RRR, no murmurs, no S3  or S4. Prophylactic Antibiotics: The patient does not                         require prophylactic antibiotics. Prior                         Anticoagulants: The  patient has taken no previous                         anticoagulant or antiplatelet agents. ASA Grade                         Assessment: II - A patient with mild systemic disease.                         After reviewing the risks and benefits, the patient                         was deemed in satisfactory condition to undergo the                         procedure. The anesthesia plan was to use monitored                         anesthesia care (MAC). Immediately prior to                         administration of medications, the patient was                         re-assessed for adequacy to receive sedatives. The                         heart rate, respiratory rate, oxygen saturations,                         blood pressure, adequacy of pulmonary ventilation, and                         response to care were monitored throughout the                         procedure. The physical status of the patient was                         re-assessed after the procedure.                        After obtaining informed consent, the colonoscope was                         passed under direct vision. Throughout the procedure,                         the patient's blood pressure, pulse, and oxygen                         saturations were monitored continuously. The  Colonoscope was introduced through the anus and                         advanced to the the terminal ileum, with                         identification of the appendiceal orifice and IC                         valve. The colonoscopy was performed without                         difficulty. The patient tolerated the procedure well.                         The quality of the bowel preparation was evaluated                         using the BBPS [facility] Bowel Preparation Scale) with                         scores of: Right Colon = 3, Transverse Colon = 3 and                         Left Colon = 3 (entire mucosa seen well  with no                         residual staining, small fragments of stool or opaque                         liquid). The total BBPS score equals 9. The terminal                         ileum, ileocecal valve, appendiceal orifice, and                         rectum were photographed. Findings:      The perianal and digital rectal examinations were normal. Pertinent       negatives include normal sphincter tone.      The terminal ileum appeared normal. Estimated blood loss: none.      Scattered small-mouthed diverticula were found in the entire colon.       Estimated blood loss: none.      The exam was otherwise without abnormality on direct and retroflexion       views. Impression:            - The examined portion of the ileum was normal.                        - Diverticulosis in the entire examined colon.                        - The examination was otherwise normal on direct and                         retroflexion views.                        -  No specimens collected. Recommendation:        - Discharge patient to home.                        - Resume previous diet.                        - Continue present medications.                        - Repeat colonoscopy in 10 years for screening                         purposes.                        - Return to GI office as previously scheduled.                        - The findings and recommendations were discussed with                         the patient. Procedure Code(s):     --- Professional ---                        [phone], Colonoscopy, flexible; diagnostic, including                         collection of specimen(s) by brushing or washing, when                         performed (separate procedure) Diagnosis Code(s):     --- Professional ---                        R10.84, Generalized abdominal pain                        K59.00, Constipation, unspecified                        K57.30, Diverticulosis of large intestine  without                         perforation or abscess without bleeding CPT copyright 2019 American Medical Association. All rights reserved. The codes documented in this report are preliminary and upon coder review may  be revised to meet current compliance requirements. Attending Participation:      I personally performed the entire procedure. Volney American, DO Annamaria Helling DO, DO 12/22/2021 10:51:21 AM This report has been signed electronically. Number of Addenda: 0 Note Initiated On: 12/22/2021 9:57 AM Scope Withdrawal Time: 0 hours 15 minutes 3 seconds  Total Procedure Duration: 0 hours 21 minutes 46 seconds  Estimated Blood Loss:  Estimated blood loss: none.      [facility]

## 2021-12-22 NOTE — Interval H&P Note (Signed)
History and Physical Interval Note: Preprocedure H&P from 12/22/21  was reviewed and there was no interval change after seeing and examining the patient.  Written consent was obtained from the patient after discussion of risks, benefits, and alternatives. Patient has consented to proceed with Esophagogastroduodenoscopy and Colonoscopy with possible intervention   12/22/2021 10:06 AM  Tanya Meza  has presented today for surgery, with the diagnosis of Change in bowel habits (R19.4) Dysphagia.  The various methods of treatment have been discussed with the patient and family. After consideration of risks, benefits and other options for treatment, the patient has consented to  Procedure(s): COLONOSCOPY (N/A) ESOPHAGOGASTRODUODENOSCOPY (EGD) (N/A) as a surgical intervention.  The patient's history has been reviewed, patient examined, no change in status, stable for surgery.  I have reviewed the patient's chart and labs.  Questions were answered to the patient's satisfaction.     [name]

## 2021-12-22 NOTE — Anesthesia Preprocedure Evaluation (Signed)
Anesthesia Evaluation  Patient identified by MRN, date of birth, ID band Patient awake    Reviewed: Allergy & Precautions, NPO status , Patient's Chart, lab work & pertinent test results  Airway Mallampati: II  TM Distance: >3 FB Neck ROM: Full    Dental  (+) Teeth Intact   Pulmonary neg pulmonary ROS,    Pulmonary exam normal breath sounds clear to auscultation       Cardiovascular Exercise Tolerance: Good negative cardio ROS Normal cardiovascular exam Rhythm:Regular     Neuro/Psych  Headaches, Anxiety negative neurological ROS  negative psych ROS   GI/Hepatic negative GI ROS, Neg liver ROS, GERD  Medicated,  Endo/Other  negative endocrine ROS  Renal/GU Renal diseasenegative Renal ROSKidney stones...  negative genitourinary   Musculoskeletal   Abdominal (+) + obese,   Peds negative pediatric ROS (+)  Hematology negative hematology ROS (+)   Anesthesia Other Findings Past Medical History: No date: Complication of anesthesia No date: GERD (gastroesophageal reflux disease)     Comment:  with preg, on zantac No date: Nephrolithiasis     Comment:  no surgery required No date: PCOS (polycystic ovarian syndrome) No date: Tachycardia     Comment:  RVOF  Past Surgical History: 2005, 2008: CESAREAN SECTION     Comment:  x3 No date: CESAREAN SECTION WITH BILATERAL TUBAL LIGATION 05/06/2019: DILITATION & CURRETTAGE/HYSTROSCOPY WITH HYDROTHERMAL  ABLATION; N/A     Comment:  Procedure: DILATATION & CURETTAGE/HYSTEROSCOPY WITH               HYDROTHERMAL ABLATION;  Surgeon: Reva Bores, MD;                Location: [facility];  Service:               Gynecology;  Laterality: N/A; 10/06/2021: ESOPHAGOGASTRODUODENOSCOPY; N/A     Comment:  Procedure: ESOPHAGOGASTRODUODENOSCOPY (EGD);  Surgeon:               [name], DO;  Location: [facility] END[facility]COPY;                Service: Gastroenterology;   Laterality: N/A; No date: WISDOM TOOTH EXTRACTION  BMI    Body Mass Index: 29.79 kg/m      Reproductive/Obstetrics negative OB ROS                             Anesthesia Physical Anesthesia Plan  ASA: 2  Anesthesia Plan: General   Post-op Pain Management:    Induction: Intravenous  PONV Risk Score and Plan: Propofol infusion and TIVA  Airway Management Planned: Natural Airway  Additional Equipment:   Intra-op Plan:   Post-operative Plan:   Informed Consent: I have reviewed the patients History and Physical, chart, labs and discussed the procedure including the risks, benefits and alternatives for the proposed anesthesia with the patient or authorized representative who has indicated his/her understanding and acceptance.     Dental Advisory Given  Plan Discussed with: CRNA and Surgeon  Anesthesia Plan Comments:         Anesthesia Quick Evaluation

## 2021-12-25 ENCOUNTER — Encounter: Payer: Self-pay | Admitting: Gastroenterology

## 2021-12-25 LAB — SURGICAL PATHOLOGY

## 2021-12-29 ENCOUNTER — Ambulatory Visit: Payer: 59

## 2022-01-05 ENCOUNTER — Ambulatory Visit: Payer: 59

## 2022-02-02 ENCOUNTER — Ambulatory Visit
Admission: RE | Admit: 2022-02-02 | Discharge: 2022-02-02 | Disposition: A | Discharge status: Home / Self Care | Payer: 59 | Source: Ambulatory Visit | Attending: Family Medicine | Admitting: Family Medicine

## 2022-02-02 DIAGNOSIS — Z1231 Encounter for screening mammogram for malignant neoplasm of breast: Secondary | ICD-10-CM

## 2022-07-05 ENCOUNTER — Encounter: Payer: Self-pay | Admitting: Family Medicine

## 2022-07-06 ENCOUNTER — Emergency Department: Payer: 59

## 2022-07-06 ENCOUNTER — Emergency Department
Admission: EM | Admit: 2022-07-06 | Discharge: 2022-07-06 | Disposition: A | Discharge status: Home / Self Care | Payer: 59 | Attending: Emergency Medicine | Admitting: Emergency Medicine

## 2022-07-06 ENCOUNTER — Other Ambulatory Visit: Payer: Self-pay

## 2022-07-06 ENCOUNTER — Other Ambulatory Visit: Payer: Self-pay | Admitting: *Deleted

## 2022-07-06 ENCOUNTER — Encounter: Payer: Self-pay | Admitting: Emergency Medicine

## 2022-07-06 DIAGNOSIS — R102 Pelvic and perineal pain: Secondary | ICD-10-CM | POA: Diagnosis present

## 2022-07-06 DIAGNOSIS — K5732 Diverticulitis of large intestine without perforation or abscess without bleeding: Secondary | ICD-10-CM | POA: Insufficient documentation

## 2022-07-06 DIAGNOSIS — K5792 Diverticulitis of intestine, part unspecified, without perforation or abscess without bleeding: Secondary | ICD-10-CM

## 2022-07-06 DIAGNOSIS — N83209 Unspecified ovarian cyst, unspecified side: Secondary | ICD-10-CM

## 2022-07-06 LAB — COMPREHENSIVE METABOLIC PANEL
ALT: 14 U/L (ref 0–44)
AST: 13 U/L — ABNORMAL LOW (ref 15–41)
Albumin: 3.5 g/dL (ref 3.5–5.0)
Alkaline Phosphatase: 51 U/L (ref 38–126)
Anion gap: 8 (ref 5–15)
BUN: 16 mg/dL (ref 6–20)
CO2: 22 mmol/L (ref 22–32)
Calcium: 8.8 mg/dL — ABNORMAL LOW (ref 8.9–10.3)
Chloride: 106 mmol/L (ref 98–111)
Creatinine, Ser: 0.75 mg/dL (ref 0.44–1.00)
GFR, Estimated: >60 mL/min (ref >60)
Glucose, Bld: 94 mg/dL (ref 70–99)
Potassium: 3.8 mmol/L (ref 3.5–5.1)
Sodium: 136 mmol/L (ref 135–145)
Total Bilirubin: 0.6 mg/dL (ref 0.3–1.2)
Total Protein: 6.5 g/dL (ref 6.5–8.1)

## 2022-07-06 LAB — URINALYSIS, ROUTINE W REFLEX MICROSCOPIC
Bilirubin Urine: NEGATIVE
Glucose, UA: NEGATIVE mg/dL
Hgb urine dipstick: NEGATIVE
Ketones, ur: NEGATIVE mg/dL
Leukocytes,Ua: NEGATIVE
Nitrite: NEGATIVE
Protein, ur: NEGATIVE mg/dL
Specific Gravity, Urine: 1.015 (ref 1.005–1.030)
pH: 5 (ref 5.0–8.0)

## 2022-07-06 LAB — CBC
HCT: 44.2 % (ref 36.0–46.0)
Hemoglobin: 14.5 g/dL (ref 12.0–15.0)
MCH: 28.3 pg (ref 26.0–34.0)
MCHC: 32.8 g/dL (ref 30.0–36.0)
MCV: 86.2 fL (ref 80.0–100.0)
Platelets: 360 10*3/uL (ref 150–400)
RBC: 5.13 MIL/uL — ABNORMAL HIGH (ref 3.87–5.11)
RDW: 13.6 % (ref 11.5–15.5)
WBC: 15.6 10*3/uL — ABNORMAL HIGH (ref 4.0–10.5)
nRBC: 0 % (ref 0.0–0.2)

## 2022-07-06 LAB — POC URINE PREG, ED: Preg Test, Ur: NEGATIVE

## 2022-07-06 LAB — LIPASE, BLOOD: Lipase: 29 U/L (ref 11–51)

## 2022-07-06 MED ORDER — FENTANYL CITRATE PF 50 MCG/ML IJ SOSY
100.0000 ug | PREFILLED_SYRINGE | Freq: Once | INTRAMUSCULAR | Status: AC
  Administered 2022-07-06: 100 ug via INTRAVENOUS
  Filled 2022-07-06: qty 2

## 2022-07-06 MED ORDER — CIPROFLOXACIN IN D5W 400 MG/200ML IV SOLN
400.0000 mg | Freq: Once | INTRAVENOUS | Status: AC
  Administered 2022-07-06: 400 mg via INTRAVENOUS
  Filled 2022-07-06: qty 200

## 2022-07-06 MED ORDER — IOHEXOL 300 MG/ML  SOLN
100.0000 mL | Freq: Once | INTRAMUSCULAR | Status: AC | PRN
  Administered 2022-07-06: 100 mL via INTRAVENOUS

## 2022-07-06 MED ORDER — SODIUM CHLORIDE 0.9 % IV BOLUS
1000.0000 mL | Freq: Once | INTRAVENOUS | Status: AC
  Administered 2022-07-06: 1000 mL via INTRAVENOUS

## 2022-07-06 MED ORDER — DOCUSATE SODIUM 100 MG PO CAPS: 100.0000 mg | ORAL_CAPSULE | Freq: Two times a day (BID) | ORAL | 0 refills | Status: AC

## 2022-07-06 MED ORDER — FENTANYL CITRATE PF 50 MCG/ML IJ SOSY
50.0000 ug | PREFILLED_SYRINGE | Freq: Once | INTRAMUSCULAR | Status: AC
  Administered 2022-07-06: 50 ug via INTRAVENOUS
  Filled 2022-07-06: qty 1

## 2022-07-06 MED ORDER — MORPHINE SULFATE (PF) 4 MG/ML IV SOLN
4.0000 mg | Freq: Once | INTRAVENOUS | Status: AC
  Administered 2022-07-06: 4 mg via INTRAVENOUS
  Filled 2022-07-06: qty 1

## 2022-07-06 MED ORDER — OXYCODONE-ACETAMINOPHEN 5-325 MG PO TABS
2.0000 | ORAL_TABLET | Freq: Once | ORAL | Status: AC
  Administered 2022-07-06: 2 via ORAL
  Filled 2022-07-06: qty 2

## 2022-07-06 MED ORDER — ONDANSETRON HCL 4 MG/2ML IJ SOLN
4.0000 mg | Freq: Once | INTRAMUSCULAR | Status: AC
  Administered 2022-07-06: 4 mg via INTRAVENOUS
  Filled 2022-07-06: qty 2

## 2022-07-06 MED ORDER — HYDROMORPHONE HCL 1 MG/ML IJ SOLN
0.5000 mg | Freq: Once | INTRAMUSCULAR | Status: AC
  Administered 2022-07-06: 0.5 mg via INTRAVENOUS
  Filled 2022-07-06: qty 0.5

## 2022-07-06 MED ORDER — ONDANSETRON 4 MG PO TBDP: 4.0000 mg | ORAL_TABLET | Freq: Three times a day (TID) | ORAL | 0 refills | Status: DC | PRN

## 2022-07-06 MED ORDER — FLUCONAZOLE 150 MG PO TABS: 150.0000 mg | ORAL_TABLET | Freq: Once | ORAL | 0 refills | Status: AC

## 2022-07-06 MED ORDER — CIPROFLOXACIN HCL 500 MG PO TABS: 500.0000 mg | ORAL_TABLET | Freq: Two times a day (BID) | ORAL | 0 refills | Status: AC

## 2022-07-06 MED ORDER — METRONIDAZOLE 500 MG/100ML IV SOLN
500.0000 mg | Freq: Once | INTRAVENOUS | Status: AC
  Administered 2022-07-06: 500 mg via INTRAVENOUS
  Filled 2022-07-06: qty 100

## 2022-07-06 MED ORDER — OXYCODONE-ACETAMINOPHEN 5-325 MG PO TABS: 1.0000 | ORAL_TABLET | Freq: Four times a day (QID) | ORAL | 0 refills | Status: DC | PRN

## 2022-07-06 MED ORDER — METRONIDAZOLE 500 MG PO TABS: 500.0000 mg | ORAL_TABLET | Freq: Two times a day (BID) | ORAL | 0 refills | Status: DC

## 2022-07-06 MED ORDER — SODIUM CHLORIDE 0.9 % IV BOLUS
500.0000 mL | Freq: Once | INTRAVENOUS | Status: AC
  Administered 2022-07-06: 500 mL via INTRAVENOUS

## 2022-07-06 NOTE — ED Provider Notes (Signed)
[facility] Provider Note  Patient Contact: 3:38 PM (approximate)   History   Abdominal Pain   HPI  Tanya Meza is a 42 y.o. female who presents the emergency department with left pelvic pain.  Patient has a history of ovarian cyst, believes that she may have had a cyst that ruptured 2 weeks ago.  However last night she started to have some left pelvic pain, became severe overnight.  Patient does have a history of nephrolithiasis as well.  States that the pain is different than her typical cyst as well as her typical kidney stone.  No vomiting, diarrhea, constipation.     Physical Exam   Triage Vital Signs: ED Triage Vitals  Enc Vitals Group     BP 07/06/22 1400 (!) 136/93     Pulse Rate 07/06/22 1400 (!) 103     Resp 07/06/22 1400 18     Temp 07/06/22 1400 98.6 F (37 C)     Temp Source 07/06/22 1400 Oral     SpO2 07/06/22 1400 97 %     Weight 07/06/22 1359 179 lb (81.2 kg)     Height 07/06/22 1359 5\' 5"  (1.651 m)     Head Circumference --      Peak Flow --      Pain Score 07/06/22 1359 9     Pain Loc --      Pain Edu? --      Excl. in GC? --     Most recent vital signs: Vitals:   07/06/22 1800 07/06/22 2200  BP: 123/65 119/80  Pulse: 94 89  Resp: 18 18  Temp: 98.7 F (37.1 C) 98.7 F (37.1 C)  SpO2: 98% 98%     General: Alert and in no acute distress.  Cardiovascular:  Good peripheral perfusion Respiratory: Normal respiratory effort without tachypnea or retractions. Lungs CTAB.  Gastrointestinal: Bowel sounds 4 quadrants.  Soft to palpation all quadrants.  Tenderness in the left lower quadrant as well as right lower quadrant.  Tenderness is worse in the left than right.  No palpable abnormality.. No guarding or rigidity. No palpable masses. No distention. No CVA tenderness. Musculoskeletal: Full range of motion to all extremities.  Neurologic:  No gross focal neurologic deficits are appreciated.  Skin:   No rash  noted Other:   ED Results / Procedures / Treatments   Labs (all labs ordered are listed, but only abnormal results are displayed) Labs Reviewed  COMPREHENSIVE METABOLIC PANEL - Abnormal; Notable for the following components:      Result Value   Calcium 8.8 (*)    AST 13 (*)    All other components within normal limits  CBC - Abnormal; Notable for the following components:   WBC 15.6 (*)    RBC 5.13 (*)    All other components within normal limits  URINALYSIS, ROUTINE W REFLEX MICROSCOPIC - Abnormal; Notable for the following components:   Color, Urine YELLOW (*)    APPearance HAZY (*)    All other components within normal limits  LIPASE, BLOOD  POC URINE PREG, ED     EKG     RADIOLOGY  I personally viewed, evaluated, and interpreted these images as part of my medical decision making, as well as reviewing the written report by the radiologist.  ED Provider Interpretation: Patient with findings consistent with diverticulitis without abscess or perforation on CT scan.  Incidental finding of nephrolithiasis in the kidney that appears stable.  No evidence  of pyelonephritis, hydroureter.  Ultrasound reveals what appears to be chronic cystic structures without acute findings, specifically no evidence of torsion.  CT ABDOMEN PELVIS W CONTRAST  Result Date: 07/06/2022 CLINICAL DATA:  LEFT side abdominal pain and nausea for 2 days, history of kidney stones, tubal ligation, PCOS EXAM: CT ABDOMEN AND PELVIS WITH CONTRAST TECHNIQUE: Multidetector CT imaging of the abdomen and pelvis was performed using the standard protocol following bolus administration of intravenous contrast. RADIATION DOSE REDUCTION: This exam was performed according to the departmental dose-optimization program which includes automated exposure control, adjustment of the mA and/or kV according to patient size and/or use of iterative reconstruction technique. CONTRAST:  112mL OMNIPAQUE IOHEXOL 300 MG/ML SOLN IV. No  oral contrast. COMPARISON:  10/11/2017 FINDINGS: Lower chest: Lung bases clear Hepatobiliary: Gallbladder and liver normal appearance Pancreas: Normal appearance Spleen: Normal appearance Adrenals/Urinary Tract: Adrenal glands and RIGHT kidney normal appearance. 3 mm nonobstructing calculus mid LEFT kidney. No renal masses, hydronephrosis, or hydroureter. Bladder unremarkable. Stomach/Bowel: Normal appendix. Stomach unremarkable. Diverticulosis of sigmoid colon with wall thickening and pericolic inflammatory changes at mid sigmoid colon consistent with acute diverticulitis. No definite extraluminal gas or abscess. Remaining bowel loops unremarkable. Vascular/Lymphatic: Mass or structures patent. Scattered normal sized retroperitoneal lymph nodes. No adenopathy. Reproductive: Focal low-attenuation foci within the uterus up to 12 mm diameter likely reflecting history of prior endometrial ablation. RIGHT tubal ligation clip noted. Ovaries unremarkable for age. Other: Small umbilical hernia containing fat. Small amount of free fluid in pelvis in especially on LEFT. No free air. No hernia. Musculoskeletal: Osseous structures unremarkable. IMPRESSION: Acute sigmoid diverticulitis without evidence of perforation or abscess. 3 mm nonobstructing LEFT renal calculus. Small umbilical hernia containing fat. Electronically Signed   By: Lavonia Dana M.D.   On: 07/06/2022 18:16   US PELVIC COMPLETE W TRANSVAGINAL AND TORSION R/O  Result Date: 07/06/2022 CLINICAL DATA:  Pelvic pain. History of pain for 2 days. Prior cesarean section, tubal ligation and ablation. Irregular menstrual periods EXAM: TRANSVAGINAL and transabdominal ULTRASOUND OF PELVIS TECHNIQUE: Both transabdominal and transvaginal ultrasound examinations of the pelvis were performed. Transabdominal technique was performed for global imaging of the pelvis including uterus, ovaries, adnexal regions, and pelvic cul-de-sac. It was necessary to proceed with endovaginal  exam following the transabdominal exam to visualize the adnexa and ovaries. COMPARISON:  Normal OB ultrasounds 2012 FINDINGS: Uterus Measurements: 7.8 x 4.3 x 4.7 = volume: 82 mL. Heterogeneous myometrium. There is some cystic areas along the myometrium posteriorly measuring 12 x 11 x 11 mm. This is towards the left side. No abnormal blood flow. Endometrium Thickness: 1.3 cm. There is a focal simple appearing cystic area measuring 7 x 5 x 6 mm. No abnormal blood flow on Doppler. Right ovary Measurements: 3.0 x 2.2 x 2.9 = volume: 9.8 mL. Follicles are seen there is a small complex cystic area in the right ovary measuring 18 x 21 x 13 mm. Preserved blood flow on Doppler. Left ovary Measurements: 2.4 x 1.8 x 2.3 = volume: 5.2 mL. Grossly preserved appearance and blood flow on Doppler. The ovary is tucked behind the uterus. Limiting complete evaluation. Other findings Trace pelvic free fluid. Transabdominal images are limited by overlapping bowel gas and soft tissue and a contracted urinary bladder. IMPRESSION: Small fluid collection along the endometrium measuring 7 mm. This could be the sequela of the patient's history of ablation of the endometrium. Please correlate with any pregnancy test. The lesion appears a simple cyst. No abnormal blood flow. Separate cystic  area along the myometrium on the left side measuring 12 mm without abnormal blood flow. 2.1 cm mildly complex right-sided ovarian cystic lesion, likely functional. No specific imaging follow-up. Trace free fluid in the pelvis. Electronically Signed   By: Karen Kays M.D.   On: 07/06/2022 18:06    PROCEDURES:  Critical Care performed: No  Procedures   MEDICATIONS ORDERED IN ED: Medications  ondansetron (ZOFRAN) injection 4 mg (4 mg Intravenous Given 07/06/22 1614)  sodium chloride 0.9 % bolus 1,000 mL (0 mLs Intravenous Stopped 07/06/22 1706)  morphine (PF) 4 MG/ML injection 4 mg (4 mg Intravenous Given 07/06/22 1615)  fentaNYL (SUBLIMAZE)  injection 50 mcg (50 mcg Intravenous Given 07/06/22 1706)  iohexol (OMNIPAQUE) 300 MG/ML solution 100 mL (100 mLs Intravenous Contrast Given 07/06/22 1748)  fentaNYL (SUBLIMAZE) injection 100 mcg (100 mcg Intravenous Given 07/06/22 1804)  ciprofloxacin (CIPRO) IVPB 400 mg (0 mg Intravenous Stopped 07/06/22 2011)  metroNIDAZOLE (FLAGYL) IVPB 500 mg (0 mg Intravenous Stopped 07/06/22 2107)  sodium chloride 0.9 % bolus 500 mL (0 mLs Intravenous Stopped 07/06/22 2011)  HYDROmorphone (DILAUDID) injection 0.5 mg (0.5 mg Intravenous Given 07/06/22 1959)  oxyCODONE-acetaminophen (PERCOCET/ROXICET) 5-325 MG per tablet 2 tablet (2 tablets Oral Given 07/06/22 2208)     IMPRESSION / MDM / ASSESSMENT AND PLAN / ED COURSE  I reviewed the triage vital signs and the nursing notes.                                 Differential diagnosis includes, but is not limited to, ovarian cyst, ovarian torsion, ruptured ovarian cyst, pregnancy, ectopic pregnancy, diverticulitis, bowel obstruction, nephrolithiasis, pyelonephritis  Patient's presentation is most consistent with acute presentation with potential threat to life or bodily function.   Patient's diagnosis is consistent with diverticulitis.  Patient presents emergency department with left lower abdominal pain.  This began yesterday and intensified this morning.  She does have a history of ovarian cyst and has had multiple cyst rupture.  She felt that she had an ovarian cyst that had ruptured roughly 10 days to 2 weeks ago.  Patient then developed left lower abdominal/pelvic pain that was different than her typical cyst pain.  Patient is also had a history of nephrolithiasis but states that the symptoms were also different than typical kidney stone.  Patient had labs, urinalysis, CT scan and ultrasound.  No evidence of torsion on ultrasound.  Ultrasound reveals what appears to be chronic cystic structures consistent with patient's history.  Patient has nephrolithiasis that  appears to be stable in the kidney.  She does have findings consistent with diverticulitis without perforation or abscess.  This matches patient's symptoms.  Patient will have antibiotics, pain medication and fluids here.  Antibiotics, pain medication, stool softener will be prescribed for the patient.  Follow-up primary care as needed..  Patient is given ED precautions to return to the ED for any worsening or new symptoms.     FINAL CLINICAL IMPRESSION(S) / ED DIAGNOSES   Final diagnoses:  Diverticulitis     Rx / DC Orders   ED Discharge Orders          Ordered    oxyCODONE-acetaminophen (PERCOCET/ROXICET) 5-325 MG tablet  Every 6 hours PRN        07/06/22 1838    ondansetron (ZOFRAN-ODT) 4 MG disintegrating tablet  Every 8 hours PRN        07/06/22 1838    metroNIDAZOLE (FLAGYL)  500 MG tablet  2 times daily        07/06/22 1838    ciprofloxacin (CIPRO) 500 MG tablet  2 times daily        07/06/22 1838    fluconazole (DIFLUCAN) 150 MG tablet   Once        07/06/22 1838    docusate sodium (COLACE) 100 MG capsule  2 times daily        07/06/22 1838             Note:  This document was prepared using Dragon voice recognition software and may include unintentional dictation errors.   Brynda Peon 07/06/22 2258    Rada Hay, MD 07/08/22 0430

## 2022-07-06 NOTE — ED Triage Notes (Signed)
Patient to ED via POV for lower abd pain for the past few days. Patient states she is suppose to get a pelvic US Monday but feels she can't wait. Hx of ruptured cyst.

## 2022-07-09 ENCOUNTER — Ambulatory Visit: Admission: RE | Admit: 2022-07-09 | Payer: 59 | Source: Ambulatory Visit

## 2022-07-18 ENCOUNTER — Ambulatory Visit: Payer: 59 | Admitting: Family Medicine

## 2022-08-01 ENCOUNTER — Ambulatory Visit: Payer: 59 | Admitting: Family Medicine

## 2022-08-22 ENCOUNTER — Ambulatory Visit (INDEPENDENT_AMBULATORY_CARE_PROVIDER_SITE_OTHER): Payer: 59 | Admitting: Family Medicine

## 2022-08-22 ENCOUNTER — Encounter: Payer: Self-pay | Admitting: Family Medicine

## 2022-08-22 VITALS — BP 109/74 | HR 71 | Ht 65.0 in | Wt 178.0 lb

## 2022-08-22 DIAGNOSIS — K5792 Diverticulitis of intestine, part unspecified, without perforation or abscess without bleeding: Secondary | ICD-10-CM | POA: Diagnosis not present

## 2022-08-22 DIAGNOSIS — R102 Pelvic and perineal pain: Secondary | ICD-10-CM

## 2022-08-22 NOTE — Assessment & Plan Note (Signed)
No evidence of ovarian pathology

## 2022-08-22 NOTE — Progress Notes (Signed)
   Subjective:    Patient ID: Tanya Meza is a 42 y.o. female presenting with No chief complaint on file.  on 08/22/2022  HPI: Here for f/u. We ordered an u/s when she thought she had an ovarian cyst. Turned out she had acute diverticulitis on CT. Improved now on antibiotics. U/S at the times showed a small ovarian follicle on the right. The endometrium had what looked like changes related to prior ablation or adenomyosis.   Review of Systems  Constitutional:  Negative for chills and fever.  Respiratory:  Negative for shortness of breath.   Cardiovascular:  Negative for chest pain.  Gastrointestinal:  Negative for abdominal pain, nausea and vomiting.  Genitourinary:  Negative for dysuria.  Skin:  Negative for rash.      Objective:    BP 109/74   Pulse 71   Ht '5\' 5"'$  (1.651 m)   Wt 178 lb (80.7 kg)   BMI 29.62 kg/m  Physical Exam Exam conducted with a chaperone present.  Constitutional:      General: She is not in acute distress.    Appearance: She is well-developed.  HENT:     Head: Normocephalic and atraumatic.  Eyes:     General: No scleral icterus. Cardiovascular:     Rate and Rhythm: Normal rate.  Pulmonary:     Effort: Pulmonary effort is normal.  Abdominal:     Palpations: Abdomen is soft.  Musculoskeletal:     Cervical back: Neck supple.  Skin:    General: Skin is warm and dry.  Neurological:     Mental Status: She is alert and oriented to person, place, and time.         Assessment & Plan:   Problem List Items Addressed This Visit       Unprioritized   Diverticulitis - Primary    Advised on diet and increasing fiber. Miralax to avoid constipation. Will let us know if she has further needs.      Acute pelvic pain    No evidence of ovarian pathology       Return if symptoms worsen or fail to improve.  Donnamae Jude, MD 08/22/2022 3:08 PM

## 2022-08-22 NOTE — Assessment & Plan Note (Signed)
Advised on diet and increasing fiber. Miralax to avoid constipation. Will let us know if she has further needs.

## 2022-12-29 ENCOUNTER — Other Ambulatory Visit: Payer: Self-pay | Admitting: Physician Assistant

## 2022-12-29 DIAGNOSIS — K219 Gastro-esophageal reflux disease without esophagitis: Secondary | ICD-10-CM

## 2023-02-08 ENCOUNTER — Ambulatory Visit
Admission: RE | Admit: 2023-02-08 | Discharge: 2023-02-08 | Disposition: A | Discharge status: Home / Self Care | Payer: 59 | Source: Ambulatory Visit | Attending: Physician Assistant | Admitting: Physician Assistant

## 2023-03-13 ENCOUNTER — Ambulatory Visit: Payer: 59 | Admitting: Physician Assistant

## 2023-03-13 VITALS — BP 115/60 | HR 73 | Temp 98.4°F | Ht 65.0 in | Wt 195.0 lb

## 2023-03-13 DIAGNOSIS — J069 Acute upper respiratory infection, unspecified: Secondary | ICD-10-CM

## 2023-03-14 ENCOUNTER — Encounter: Payer: Self-pay | Admitting: Physician Assistant

## 2023-03-14 LAB — POC COVID19 BINAXNOW

## 2023-03-14 NOTE — Progress Notes (Signed)
Established patient visit  Patient: Tanya Meza   DOB: [birth year]   42 y.o. Female  MRN: [id number] Visit Date: 03/13/2023  Today's healthcare provider: Debera Lat, PA-C   Chief Complaint  Patient presents with   Cough    Asscoiated with chest congestion, headache behind right eye and drainage. Reports at time cough is productive and has a yellowish tint at times if not clear. Symptoms have been present since Sunday morning. Reports being sick about a month ago with same symptoms but they had gotten better just never fully went away and symptoms returned Sunday. Patient reports she has taking dayquil and ibuprofen for symptoms.    Subjective     Discussed the use of AI scribe software for clinical note transcription with the patient, who gave verbal consent to proceed.  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 The patient reports a similar illness about a month ago that improved but left a residual dry cough. The patient denies fever but reports night sweats. The patient also reports a history of headaches that occur every few months and are similar to migraines. The patient denies smoking and lung problems but reports possible mild seasonal allergies. The patient also reports a feeling of wax build-up in the ears. The patient has been exposed to COVID-19 at work but denies any known close contacts with COVID-19 positive individuals.           11/17/2021    2:01 PM 09/09/2020    9:03 AM 03/03/2020   11:53 AM  Depression screen PHQ 2/9  Decreased Interest 0 0 0  Down, Depressed, Hopeless 0 0 0  PHQ - 2 Score 0 0 0  Altered sleeping 0 0 0  Tired, decreased energy 0 0 0  Change in appetite 0 0 0  Feeling bad or failure about yourself  0 0 0  Trouble concentrating 0 0 0  Moving slowly or fidgety/restless 0 0 0  Suicidal thoughts  0 0 0  PHQ-9 Score 0 0 0  Difficult doing work/chores Not difficult at all Not difficult at all Not difficult at all      08/24/2016   10:20 AM  GAD 7 : Generalized Anxiety Score  Nervous, Anxious, on Edge 1  Control/stop worrying 0  Worry too much - different things 1  Trouble relaxing 0  Restless 0  Easily annoyed or irritable 0  Afraid - awful might happen 0  Total GAD 7 Score 2  Anxiety Difficulty Not difficult at all    Medications: Outpatient Medications Prior to Visit  Medication Sig   omeprazole (PRILOSEC) 40 MG capsule TAKE 1 CAPSULE BY MOUTH EVERY DAY   Polyethylene Glycol 3350 (MIRALAX PO) Take by mouth. 1 cap full 1-2 times a week   ondansetron (ZOFRAN-ODT) 4 MG disintegrating tablet Take 1 tablet (4 mg total) by mouth every 8 (eight) hours as needed. (Patient not taking: Reported on 08/22/2022)   oxyCODONE-acetaminophen (PERCOCET/ROXICET) 5-325 MG tablet Take 1 tablet by mouth every 6 (six) hours as needed for severe pain. (Patient not taking: Reported on 08/22/2022)   SEMAGLUTIDE-WEIGHT MANAGEMENT [place] Inject into the skin. Per pt 1.06 mg ? Compounded injection (Patient not taking: Reported on 08/22/2022)   No facility-administered medications prior to visit.    Review of Systems  All other systems reviewed and are negative.  Except see  HPI       Objective    BP 115/60 (BP Location: Left Arm, Patient Position: Sitting, Cuff Size: Normal)   Pulse 73   Temp 98.4 F (36.9 C) (Oral)   Ht 5\' 5"  (1.651 m)   Wt 195 lb (88.5 kg)   SpO2 97%   BMI 32.45 kg/m     Physical Exam Vitals reviewed.  Constitutional:      General: She is not in acute distress.    Appearance: Normal appearance. She is well-developed. She is not diaphoretic.  HENT:     Head: Normocephalic and atraumatic.     Right Ear: Ear canal and external ear normal. There is impacted cerumen.     Left Ear: Ear canal and external ear normal. There is impacted cerumen.     Nose: Congestion and  rhinorrhea present.     Mouth/Throat:     Pharynx: Posterior oropharyngeal erythema (mild) present.  Eyes:     General: No scleral icterus.       Right eye: No discharge.        Left eye: No discharge.     Extraocular Movements: Extraocular movements intact.     Conjunctiva/sclera: Conjunctivae normal.     Pupils: Pupils are equal, round, and reactive to light.  Neck:     Thyroid: No thyromegaly.  Cardiovascular:     Rate and Rhythm: Normal rate and regular rhythm.     Pulses: Normal pulses.     Heart sounds: Normal heart sounds. No murmur heard. Pulmonary:     Effort: Pulmonary effort is normal. No respiratory distress.     Breath sounds: Normal breath sounds. No wheezing, rhonchi or rales.  Musculoskeletal:     Cervical back: Neck supple.     Right lower leg: No edema.     Left lower leg: No edema.  Lymphadenopathy:     Cervical: No cervical adenopathy.  Skin:    General: Skin is warm and dry.     Findings: No rash.  Neurological:     Mental Status: She is alert and oriented to person, place, and time. Mental status is at baseline.  Psychiatric:        Mood and Affect: Mood normal.        Behavior: Behavior normal.      No results found for any visits on 03/13/23.  Assessment & Plan      Upper Respiratory Infection Symptoms of chest congestion, headache, cough, and nasal congestion present for four days. History of similar symptoms one month ago. No fever or severe fatigue. Lungs clear on examination. Suspected viral infection, possibly COVID-19 due to patient's work environment. -Perform COVID-19 test. Negative test. -Advise symptomatic treatment with over-the-counter pain medication (Tylenol and ibuprofen), Mucinex, and antihistamines (Allegra, Claritin, or Zyrtec). -Recommend nasal saline spray and Flonase for nasal congestion. -Advise warm salt gargle for throat irritation. -Consider antibiotic treatment if symptoms persist or worsen.  Cerumen Impaction Patient  reports feeling like she has a lot of earwax. Right ear partially occluded on examination. -Recommend Debrox to soften and remove earwax.  Follow-up If symptoms persist or worsen, or if fever develops, patient should contact the clinic. If COVID-19 test is positive, discuss antiviral treatment options.      No follow-ups on file.     The patient was advised to call back or seek an in-person evaluation if the symptoms worsen or if the condition fails to improve as anticipated.  I discussed the assessment and treatment plan  with the patient. The patient was provided an opportunity to ask questions and all were answered. The patient agreed with the plan and demonstrated an understanding of the instructions.  I, Debera Lat, PA-C have reviewed all documentation for this visit. The documentation on  03/13/23  for the exam, diagnosis, procedures, and orders are all accurate and complete.  Debera Lat, [facility], MMS [facility] [phone] (phone) [phone] (fax)  [facility]

## 2023-04-19 ENCOUNTER — Ambulatory Visit (INDEPENDENT_AMBULATORY_CARE_PROVIDER_SITE_OTHER): Payer: 59 | Admitting: Physician Assistant

## 2023-04-19 ENCOUNTER — Encounter: Payer: Self-pay | Admitting: Physician Assistant

## 2023-04-19 VITALS — BP 107/58 | HR 70 | Ht 65.0 in | Wt 193.0 lb

## 2023-04-19 DIAGNOSIS — Z0001 Encounter for general adult medical examination with abnormal findings: Secondary | ICD-10-CM | POA: Diagnosis not present

## 2023-04-19 DIAGNOSIS — E66811 Obesity, class 1: Secondary | ICD-10-CM | POA: Diagnosis not present

## 2023-04-19 DIAGNOSIS — R0981 Nasal congestion: Secondary | ICD-10-CM

## 2023-04-19 DIAGNOSIS — R053 Chronic cough: Secondary | ICD-10-CM

## 2023-04-19 DIAGNOSIS — Z833 Family history of diabetes mellitus: Secondary | ICD-10-CM

## 2023-04-19 DIAGNOSIS — Z0000 Encounter for general adult medical examination without abnormal findings: Secondary | ICD-10-CM

## 2023-04-19 MED ORDER — IPRATROPIUM BROMIDE 0.03 % NA SOLN: 2.0000 | Freq: Two times a day (BID) | NASAL | 1 refills | Status: DC

## 2023-04-19 MED ORDER — FLUTICASONE PROPIONATE 50 MCG/ACT NA SUSP: 2.0000 | Freq: Every day | NASAL | 6 refills | Status: DC

## 2023-04-19 NOTE — Progress Notes (Unsigned)
Complete physical exam  Patient: Tanya Meza   DOB: 06-29-1980   42 y.o. Female  MRN: [id number] Visit Date: 04/19/2023  Today's healthcare provider: Debera Lat, PA-C   Chief Complaint  Patient presents with   Annual Exam    Pt stated--still coughing yellow mucus. Annual exam   Subjective    Tanya Meza is a 42 y.o. female who presents today for a complete physical exam.   HPI     Annual Exam    Additional comments: Pt stated--still coughing yellow mucus. Annual exam      Last edited by Shelly Bombard, CMA on 04/19/2023  9:36 AM.      Discussed the use of AI scribe software for clinical note transcription with the patient, who gave verbal consent to proceed.  History of Present Illness   The patient, a pediatric dental assistant, presents for a routine physical examination. She reports a persistent cough that has been present for approximately two months. The cough is productive, with yellowish sputum. The patient also reports occasional foot pain and tingling in the fingers. The patient is currently on Ozempic and omeprazole. The patient has a family history of type 2 diabetes and rheumatoid arthritis. The patient is active, with a busy schedule involving work and taking care of three children.        Last depression screening scores    04/19/2023    9:32 AM 11/17/2021    2:01 PM 09/09/2020    9:03 AM  PHQ 2/9 Scores  PHQ - 2 Score 0 0 0  PHQ- 9 Score 0 0 0   Last fall risk screening    11/17/2021    2:01 PM  Fall Risk   Falls in the past year? 0  Number falls in past yr: 0  Injury with Fall? 0  Risk for fall due to : No Fall Risks   Last Audit-C alcohol use screening    11/17/2021    2:01 PM  Alcohol Use Disorder Test (AUDIT)  1. How often do you have a drink containing alcohol? 2  2. How many drinks containing alcohol do you have on a typical day when you are drinking? 0  3. How often do you have six or more drinks on one occasion? 0  AUDIT-C  Score 2   A score of 3 or more in women, and 4 or more in men indicates increased risk for alcohol abuse, EXCEPT if all of the points are from question 1   Past Medical History:  Diagnosis Date   Complication of anesthesia    GERD (gastroesophageal reflux disease)    with preg, on zantac   Nephrolithiasis    no surgery required   PCOS (polycystic ovarian syndrome)    Tachycardia    RVOF   Past Surgical History:  Procedure Laterality Date   CESAREAN SECTION  2005, 2008   x3   CESAREAN SECTION WITH BILATERAL TUBAL LIGATION     COLONOSCOPY N/A 12/22/2021   Procedure: COLONOSCOPY;  Surgeon: [name], DO;  Location: [facility] END[facility]COPY;  Service: Gastroenterology;  Laterality: N/A;   DILITATION & CURRETTAGE/HYSTROSCOPY WITH HYDROTHERMAL ABLATION N/A 05/06/2019   Procedure: DILATATION & CURETTAGE/HYSTEROSCOPY WITH HYDROTHERMAL ABLATION;  Surgeon: Reva Bores, MD;  Location: [facility];  Service: Gynecology;  Laterality: N/A;   ESOPHAGOGASTRODUODENOSCOPY N/A 10/06/2021   Procedure: ESOPHAGOGASTRODUODENOSCOPY (EGD);  Surgeon: [name], DO;  Location[facility]COPY;  Service: Gastroenterology;  Laterality: N/A;  ESOPHAGOGASTRODUODENOSCOPY N/A 12/22/2021   Procedure: ESOPHAGOGASTRODUODENOSCOPY (EGD);  Surgeon: [name], DO;  Location: [facility] END[facility]COPY;  Service: Gastroenterology;  Laterality: N/A;   WISDOM TOOTH EXTRACTION     Social History   Socioeconomic History   Marital status: Married    Spouse name: Not on file   Number of children: 3   Years of education: Not on file   Highest education level: Not on file  Occupational History    Employer: [employer]  Tobacco Use   Smoking status: Never   Smokeless tobacco: Never  Vaping Use   Vaping status: Never Used  Substance and Sexual Activity   Alcohol use: Yes    Comment: social   Drug use: No   Sexual activity: Yes    Partners: Male    Birth control/protection: Surgical    Comment:  tubal ligation.  Other Topics Concern   Not on file  Social History Narrative   Not on file   Social Determinants of Health   Financial Resource Strain: Not on file  Food Insecurity: Not on file  Transportation Needs: Not on file  Physical Activity: Not on file  Stress: Not on file  Social Connections: Not on file  Intimate Partner Violence: Not At Risk (12/30/2018)   Humiliation, Afraid, Rape, and Kick questionnaire    Fear of Current or Ex-Partner: No    Emotionally Abused: No    Physically Abused: No    Sexually Abused: No   Family Status  Relation Name Status   Mother  Alive   Father  Alive   Brother 1 Alive   MGF  Deceased   PGF  Deceased   Brother 2 Alive   Brother 3 Alive   Daughter  Alive   Son  Alive   Daughter  Alive   Mat Aunt  (Not Specified)   Neg Hx  (Not Specified)  No partnership data on file   Family History  Problem Relation Age of Onset   Thyroid disease Mother    Hyperlipidemia Mother    Hypothyroidism Mother    Tongue cancer Mother    Diabetes Father    Hyperlipidemia Father    Hypertension Brother    Heart attack Maternal Grandfather    Alcohol abuse Paternal Grandfather    Heart attack Paternal Grandfather    Hyperlipidemia Brother    Breast cancer Maternal Aunt    Anesthesia problems Neg Hx    Colon cancer Neg Hx    Allergies  Allergen Reactions   Latex Swelling    Swelling- around catheter    Patient Care Team: Debera Lat, PA-C as PCP - General (Physician Assistant) Ethelene Browns, NP as Nurse Practitioner (Family Medicine)   Medications: Outpatient Medications Prior to Visit  Medication Sig   omeprazole (PRILOSEC) 40 MG capsule TAKE 1 CAPSULE BY MOUTH EVERY DAY   Polyethylene Glycol 3350 (MIRALAX PO) Take by mouth. 1 cap full 1-2 times a week   SEMAGLUTIDE-WEIGHT MANAGEMENT [place] Inject into the skin. Per pt 1.06 mg ? Compounded injection   [DISCONTINUED] ondansetron (ZOFRAN-ODT) 4 MG disintegrating tablet Take 1 tablet (4  mg total) by mouth every 8 (eight) hours as needed. (Patient not taking: Reported on 08/22/2022)   [DISCONTINUED] oxyCODONE-acetaminophen (PERCOCET/ROXICET) 5-325 MG tablet Take 1 tablet by mouth every 6 (six) hours as needed for severe pain. (Patient not taking: Reported on 08/22/2022)   No facility-administered medications prior to visit.    Review of Systems  All other systems reviewed and are negative.  Except see HPI  {Insert previous labs (optional):23779} {See past labs  Heme  Chem  Endocrine  Serology  Results Review (optional):1}  Objective    BP (!) 107/58 (BP Location: Left Arm, Patient Position: Sitting, Cuff Size: Normal)   Pulse 70   Ht 5\' 5"  (1.651 m)   Wt 193 lb (87.5 kg)   LMP 02/17/2023   SpO2 100%   BMI 32.12 kg/m  {Insert last BP/Wt (optional):23777}{See vitals history (optional):1}    Physical Exam Vitals reviewed.  Constitutional:      General: She is not in acute distress.    Appearance: Normal appearance. She is well-developed. She is obese. She is not ill-appearing, toxic-appearing or diaphoretic.  HENT:     Head: Normocephalic and atraumatic.     Right Ear: Tympanic membrane, ear canal and external ear normal.     Left Ear: Tympanic membrane, ear canal and external ear normal.     Nose: Nose normal. No congestion or rhinorrhea.     Mouth/Throat:     Mouth: Mucous membranes are moist.     Pharynx: Oropharynx is clear. No oropharyngeal exudate.  Eyes:     General: No scleral icterus.       Right eye: No discharge.        Left eye: No discharge.     Conjunctiva/sclera: Conjunctivae normal.     Pupils: Pupils are equal, round, and reactive to light.  Neck:     Thyroid: No thyromegaly.     Vascular: No carotid bruit.  Cardiovascular:     Rate and Rhythm: Normal rate and regular rhythm.     Pulses: Normal pulses.     Heart sounds: Normal heart sounds. No murmur heard.    No friction rub. No gallop.  Pulmonary:     Effort: Pulmonary effort is  normal. No respiratory distress.     Breath sounds: Normal breath sounds. No wheezing or rales.  Abdominal:     General: Abdomen is flat. Bowel sounds are normal. There is no distension.     Palpations: Abdomen is soft. There is no mass.     Tenderness: There is no abdominal tenderness. There is no right CVA tenderness, left CVA tenderness, guarding or rebound.     Hernia: No hernia is present.  Musculoskeletal:        General: No swelling, tenderness, deformity or signs of injury. Normal range of motion.     Cervical back: Normal range of motion and neck supple. No rigidity or tenderness.     Right lower leg: No edema.     Left lower leg: No edema.  Lymphadenopathy:     Cervical: No cervical adenopathy.  Skin:    General: Skin is warm and dry.     Coloration: Skin is not jaundiced or pale.     Findings: No bruising, erythema, lesion or rash.  Neurological:     Mental Status: She is alert and oriented to person, place, and time. Mental status is at baseline.     Gait: Gait normal.  Psychiatric:        Mood and Affect: Mood normal.        Behavior: Behavior normal.        Thought Content: Thought content normal.        Judgment: Judgment normal.      No results found for any visits on 04/19/23.  Assessment & Plan    Routine Health Maintenance and Physical Exam  Exercise Activities and Dietary recommendations  Goals      Exercise 150 minutes per week (moderate activity)        Immunization History  Administered Date(s) Administered   DTaP 12/10/1978, 02/14/1979, 04/14/1979, 07/24/1980, 10/16/1983   Hepatitis B 08/09/1996, 03/09/1997, 04/08/1997   IPV 01/21/1979, 03/17/1979, 05/26/1979   Influenza Split 04/24/2011, 08/05/2012   Influenza,inj,Quad PF,6+ Mos 04/02/2017, 04/02/2018, 03/04/2019   MMR 02/17/1980   PPD Test 07/23/2011   Td 03/09/1997, 05/16/2011, 11/17/2021   Tdap 05/16/2011    Health Maintenance  Topic Date Due   COVID-19 Vaccine (1 - 2023-24 season)  Never done   Flu Shot  09/16/2023*   Pap with HPV screening  11/23/2026   DTaP/Tdap/Td vaccine (10 - Td or Tdap) 11/18/2031   Hepatitis C Screening  Completed   HIV Screening  Completed   HPV Vaccine  Aged Out  *Topic was postponed. The date shown is not the original due date.    Discussed health benefits of physical activity, and encouraged her to engage in regular exercise appropriate for her age and condition. 1. Annual physical exam TD on dental/eye Things to do to keep yourself healthy  - Exercise at least 30-45 minutes a day, 3-4 days a week.  - Eat a low-fat diet with lots of fruits and vegetables, up to 7-9 servings per day.  - Seatbelts can save your life. Wear them always.  - Smoke detectors on every level of your home, check batteries every year.  - Eye Doctor - have an eye exam every 1-2 years  - Safe sex - if you may be exposed to STDs, use a condom.  - Alcohol -  If you drink, do it moderately, less than 2 drinks per day.  - Health Care Power of Attorney. Choose someone to speak for you if you are not able.  - Depression is common in our stressful world.If you're feeling down or losing interest in things you normally enjoy, please come in for a visit.  - Violence - If anyone is threatening or hurting you, please call immediately.  - CBC with Differential/Platelet - Comprehensive metabolic panel - Lipid panel - TSH - Hemoglobin A1c  2. Obesity (BMI 30.0-34.9) *** - CBC with Differential/Platelet - Comprehensive metabolic panel - Lipid panel - TSH - Hemoglobin A1c  3. Family history of diabetes mellitus *** - Comprehensive metabolic panel - Hemoglobin A1c  4. Nasal congestion *** - fluticasone (FLONASE) 50 MCG/ACT nasal spray; Place 2 sprays into both nostrils daily.  Dispense: 16 g; Refill: 6 - ipratropium (ATROVENT) 0.03 % nasal spray; Place 2 sprays into both nostrils 2 (two) times daily.  Dispense: 30 mL; Refill: 1     Chronic Cough Persistent cough  with yellowish sputum, possibly due to post-nasal drip or viral infection. No signs of infection or congestion on examination. -Prescribe Ipratropium nasal spray. -Advise over-the-counter Flonase and Claritin. -Consider ENT referral if symptoms persist.  Hand Pain and Tingling Possible early signs of carpal tunnel syndrome. No numbness on examination. -Advise hand exercises and stretching. -Consider use of compression sleeve or brace if symptoms worsen.  Foot Pain Occasional foot pain, possibly due to prolonged standing at work. No signs of infection or swelling. -Advise use of compression socks and elevation of feet when possible.  General Health Maintenance: -Advise patient to utilize eye exams covered by insurance. -Order blood work to check CBC, metabolic panel, TSH, and lipids. -Continue current medications Ozempic and Omeprazole. -Schedule follow-up appointment in one year for physical, or sooner if cough persists.  1. Annual physical exam Will need to have an annual eye Things to do to keep yourself healthy  - Exercise at least 30-45 minutes a day, 3-4 days a week.  - Eat a low-fat diet with lots of fruits and vegetables, up to 7-9 servings per day.  - Seatbelts can save your life. Wear them always.  - Smoke detectors on every level of your home, check batteries every year.  - Eye Doctor - have an eye exam every 1-2 years  - Safe sex - if you may be exposed to STDs, use a condom.  - Alcohol -  If you drink, do it moderately, less than 2 drinks per day.  - Health Care Power of Attorney. Choose someone to speak for you if you are not able.  - Depression is common in our stressful world.If you're feeling down or losing interest in things you normally enjoy, please come in for a visit.  - Violence - If anyone is threatening or hurting you, please call immediately.  - CBC with Differential/Platelet - Comprehensive metabolic panel - Lipid panel - TSH - Hemoglobin  A1c  2. Obesity (BMI 30.0-34.9) *** - CBC with Differential/Platelet - Comprehensive metabolic panel - Lipid panel - TSH - Hemoglobin A1c  3. Family history of diabetes mellitus *** - Comprehensive metabolic panel - Hemoglobin A1c  4. Nasal congestion *** - fluticasone (FLONASE) 50 MCG/ACT nasal spray; Place 2 sprays into both nostrils daily.  Dispense: 16 g; Refill: 6 - ipratropium (ATROVENT) 0.03 % nasal spray; Place 2 sprays into both nostrils 2 (two) times daily.  Dispense: 30 mL; Refill: 1    ***  Return in about 1 year (around 04/18/2024) for CPE.    The patient was advised to call back or seek an in-person evaluation if the symptoms worsen or if the condition fails to improve as anticipated.  I discussed the assessment and treatment plan with the patient. The patient was provided an opportunity to ask questions and all were answered. The patient agreed with the plan and demonstrated an understanding of the instructions.  I, Debera Lat, PA-C have reviewed all documentation for this visit. The documentation on  04/19/23 for the exam, diagnosis, procedures, and orders are all accurate and complete.  Debera Lat, [facility], MMS [facility] [phone] (phone) [phone] (fax)  [facility]

## 2023-04-20 LAB — COMPREHENSIVE METABOLIC PANEL
ALT: 15 [IU]/L (ref 0–32)
AST: 15 [IU]/L (ref 0–40)
Albumin: 4.2 g/dL (ref 3.9–4.9)
Alkaline Phosphatase: 67 [IU]/L (ref 44–121)
BUN/Creatinine Ratio: 16 (ref 9–23)
BUN: 12 mg/dL (ref 6–24)
Bilirubin Total: <0.2 mg/dL (ref 0.0–1.2)
CO2: 24 mmol/L (ref 20–29)
Calcium: 9.2 mg/dL (ref 8.7–10.2)
Chloride: 103 mmol/L (ref 96–106)
Creatinine, Ser: 0.73 mg/dL (ref 0.57–1.00)
Globulin, Total: 2.6 g/dL (ref 1.5–4.5)
Glucose: 90 mg/dL (ref 70–99)
Potassium: 4.7 mmol/L (ref 3.5–5.2)
Sodium: 137 mmol/L (ref 134–144)
Total Protein: 6.8 g/dL (ref 6.0–8.5)
eGFR: 104 mL/min/{1.73_m2} (ref >59)

## 2023-04-20 LAB — CBC WITH DIFFERENTIAL/PLATELET
Basophils Absolute: 0 10*3/uL (ref 0.0–0.2)
Basos: 0 %
EOS (ABSOLUTE): 0.2 10*3/uL (ref 0.0–0.4)
Eos: 3 %
Hematocrit: 43.2 % (ref 34.0–46.6)
Hemoglobin: 13.6 g/dL (ref 11.1–15.9)
Immature Grans (Abs): 0 10*3/uL (ref 0.0–0.1)
Immature Granulocytes: 0 %
Lymphocytes Absolute: 1.9 10*3/uL (ref 0.7–3.1)
Lymphs: 25 %
MCH: 28.6 pg (ref 26.6–33.0)
MCHC: 31.5 g/dL (ref 31.5–35.7)
MCV: 91 fL (ref 79–97)
Monocytes Absolute: 0.5 10*3/uL (ref 0.1–0.9)
Monocytes: 7 %
Neutrophils Absolute: 4.8 10*3/uL (ref 1.4–7.0)
Neutrophils: 65 %
Platelets: 319 10*3/uL (ref 150–450)
RBC: 4.75 x10E6/uL (ref 3.77–5.28)
RDW: 13.1 % (ref 11.7–15.4)
WBC: 7.4 10*3/uL (ref 3.4–10.8)

## 2023-04-20 LAB — LIPID PANEL
Chol/HDL Ratio: 3.7 ratio (ref 0.0–4.4)
Cholesterol, Total: 195 mg/dL (ref 100–199)
HDL: 53 mg/dL (ref >39)
LDL Chol Calc (NIH): 123 mg/dL — ABNORMAL HIGH (ref 0–99)
Triglycerides: 108 mg/dL (ref 0–149)
VLDL Cholesterol Cal: 19 mg/dL (ref 5–40)

## 2023-04-20 LAB — HEMOGLOBIN A1C
Est. average glucose Bld gHb Est-mCnc: 117 mg/dL
Hgb A1c MFr Bld: 5.7 % — ABNORMAL HIGH (ref 4.8–5.6)

## 2023-04-20 LAB — TSH: TSH: 1.87 u[IU]/mL (ref 0.450–4.500)

## 2023-04-24 ENCOUNTER — Ambulatory Visit: Payer: 59 | Admitting: Family Medicine

## 2023-04-24 ENCOUNTER — Encounter: Payer: Self-pay | Admitting: Family Medicine

## 2023-04-24 VITALS — BP 131/79 | HR 76 | Ht 65.0 in | Wt 195.4 lb

## 2023-04-24 DIAGNOSIS — N920 Excessive and frequent menstruation with regular cycle: Secondary | ICD-10-CM | POA: Diagnosis not present

## 2023-04-24 DIAGNOSIS — R053 Chronic cough: Secondary | ICD-10-CM | POA: Diagnosis not present

## 2023-04-24 DIAGNOSIS — Z01419 Encounter for gynecological examination (general) (routine) without abnormal findings: Secondary | ICD-10-CM

## 2023-04-24 MED ORDER — AZITHROMYCIN 250 MG PO TABS: 250.0000 mg | ORAL_TABLET | Freq: Once | ORAL | 0 refills | Status: AC

## 2023-04-24 NOTE — Progress Notes (Signed)
Patient presents for Annual.  LMP: Ablation Last pap: Date: 11/22/21 Contraception:  ablation  Mammogram: Up to date: 02/08/23 STD Screening: Declines Flu Vaccine : Declines  CC:  Annual

## 2023-04-24 NOTE — Assessment & Plan Note (Signed)
Improved after ablation

## 2023-04-24 NOTE — Patient Instructions (Signed)

## 2023-04-24 NOTE — Progress Notes (Signed)
Subjective:     Tanya Meza is a 42 y.o. female and is here for a comprehensive physical exam. The patient reports problems - cough for the last 2 months. Has h/o abnormal uterine bleeding, better since ablation. Still has some cycles, but tolerable.  The following portions of the patient's history were reviewed and updated as appropriate: allergies, current medications, past family history, past medical history, past social history, past surgical history, and problem list.  Review of Systems Pertinent items noted in HPI and remainder of comprehensive ROS otherwise negative.   Objective:    BP 131/79   Pulse 76   Ht 5\' 5"  (1.651 m)   Wt 195 lb 6.4 oz (88.6 kg)   LMP 02/17/2023   BMI 32.52 kg/m  General appearance: alert, cooperative, and appears stated age Head: Normocephalic, without obvious abnormality, atraumatic Back: symmetric, no curvature. ROM normal. No CVA tenderness. Lungs: clear to auscultation bilaterally Heart: regular rate and rhythm, S1, S2 normal, no murmur, click, rub or gallop Abdomen: soft, non-tender; bowel sounds normal; no masses,  no organomegaly Extremities: extremities normal, atraumatic, no cyanosis or edema Skin: Skin color, texture, turgor normal. No rashes or lesions Neurologic: Grossly normal    Assessment:    Healthy female exam.      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    See After Visit Summary for Counseling Recommendations

## 2023-05-19 ENCOUNTER — Emergency Department
Admission: EM | Admit: 2023-05-19 | Discharge: 2023-05-19 | Discharge status: Against Medical Advice | Payer: 59 | Attending: Emergency Medicine | Admitting: Emergency Medicine

## 2023-05-19 ENCOUNTER — Emergency Department: Payer: 59

## 2023-05-19 ENCOUNTER — Encounter: Payer: Self-pay | Admitting: Emergency Medicine

## 2023-05-19 DIAGNOSIS — R112 Nausea with vomiting, unspecified: Secondary | ICD-10-CM | POA: Diagnosis not present

## 2023-05-19 DIAGNOSIS — Z5321 Procedure and treatment not carried out due to patient leaving prior to being seen by health care provider: Secondary | ICD-10-CM | POA: Insufficient documentation

## 2023-05-19 DIAGNOSIS — R079 Chest pain, unspecified: Secondary | ICD-10-CM | POA: Insufficient documentation

## 2023-05-19 LAB — BASIC METABOLIC PANEL
Anion gap: 7 (ref 5–15)
BUN: 16 mg/dL (ref 6–20)
CO2: 28 mmol/L (ref 22–32)
Calcium: 8.6 mg/dL — ABNORMAL LOW (ref 8.9–10.3)
Chloride: 102 mmol/L (ref 98–111)
Creatinine, Ser: 0.75 mg/dL (ref 0.44–1.00)
GFR, Estimated: >60 mL/min (ref >60)
Glucose, Bld: 121 mg/dL — ABNORMAL HIGH (ref 70–99)
Potassium: 3 mmol/L — ABNORMAL LOW (ref 3.5–5.1)
Sodium: 137 mmol/L (ref 135–145)

## 2023-05-19 LAB — CBC
HCT: 42.6 % (ref 36.0–46.0)
Hemoglobin: 13.7 g/dL (ref 12.0–15.0)
MCH: 28.8 pg (ref 26.0–34.0)
MCHC: 32.2 g/dL (ref 30.0–36.0)
MCV: 89.5 fL (ref 80.0–100.0)
Platelets: 385 10*3/uL (ref 150–400)
RBC: 4.76 MIL/uL (ref 3.87–5.11)
RDW: 13.2 % (ref 11.5–15.5)
WBC: 8.8 10*3/uL (ref 4.0–10.5)
nRBC: 0 % (ref 0.0–0.2)

## 2023-05-19 LAB — LIPASE, BLOOD: Lipase: 40 U/L (ref 11–51)

## 2023-05-19 LAB — TROPONIN I (HIGH SENSITIVITY): Troponin I (High Sensitivity): 6 ng/L (ref <18)

## 2023-05-19 MED ORDER — ALUM & MAG HYDROXIDE-SIMETH 200-200-20 MG/5ML PO SUSP
30.0000 mL | Freq: Once | ORAL | Status: AC
  Administered 2023-05-19: 30 mL via ORAL
  Filled 2023-05-19: qty 30

## 2023-05-19 NOTE — ED Triage Notes (Signed)
Pt here from home with c/o centralized chest pain that started this evening had some nausea and vomiting happened 2 nights ago also , no sob

## 2023-11-28 ENCOUNTER — Telehealth: Admitting: Physician Assistant

## 2023-11-28 DIAGNOSIS — M545 Low back pain, unspecified: Secondary | ICD-10-CM | POA: Diagnosis not present

## 2023-11-28 MED ORDER — CYCLOBENZAPRINE HCL 10 MG PO TABS: 10.0000 mg | ORAL_TABLET | Freq: Three times a day (TID) | ORAL | 0 refills | Status: DC | PRN

## 2023-11-28 MED ORDER — MELOXICAM 15 MG PO TABS: 15.0000 mg | ORAL_TABLET | Freq: Every day | ORAL | 0 refills | Status: DC

## 2023-11-28 NOTE — Progress Notes (Signed)
 Virtual Visit Consent   Tanya Meza, you are scheduled for a virtual visit with a [facility] provider today. Just as with appointments in the office, your consent must be obtained to participate. Your consent will be active for this visit and any virtual visit you may have with one of our providers in the next 365 days. If you have a MyChart account, a copy of this consent can be sent to you electronically.  As this is a virtual visit, video technology does not allow for your provider to perform a traditional examination. This may limit your provider's ability to fully assess your condition. If your provider identifies any concerns that need to be evaluated in person or the need to arrange testing (such as labs, EKG, etc.), we will make arrangements to do so. Although advances in technology are sophisticated, we cannot ensure that it will always work on either your end or our end. If the connection with a video visit is poor, the visit may have to be switched to a telephone visit. With either a video or telephone visit, we are not always able to ensure that we have a secure connection.  By engaging in this virtual visit, you consent to the provision of healthcare and authorize for your insurance to be billed (if applicable) for the services provided during this visit. Depending on your insurance coverage, you may receive a charge related to this service.  I need to obtain your verbal consent now. Are you willing to proceed with your visit today? Tanya Meza has provided verbal consent on 11/28/2023 for a virtual visit (video or telephone). Hyla Maillard, [place]  Date: 11/28/2023 1:03 PM   Virtual Visit via Video Note   I, Hyla Maillard, connected with  Tanya Meza  ([id number], Dec 07, 1980) on 11/28/23 at 12:45 PM EDT by a video-enabled telemedicine application and verified that I am speaking with the correct person using two identifiers.  Location: Patient: Virtual Visit  Location Patient: Home Provider: Virtual Visit Location Provider: Home Office   I discussed the limitations of evaluation and management by telemedicine and the availability of in person appointments. The patient expressed understanding and agreed to proceed.    History of Present Illness: Tanya Meza is a 43 y.o. who identifies as a female who was assigned female at birth, and is being seen today for 4-5 days of intermittent left lower back pain - side pain described as a discomfort with sitting. Last night around 3AM was noting some increased pain in that area. Notes she got up and moved around a bit to see if it would help. Pain about 5/10 at the time. Took 3 Advil  and applied heating pad to the area. Was able to go back to sleep around 4 AM. This morning has not had another episode of the more substantial pain but has noted mild pain, here and there. Denies abdominal pain at present.  Denies noted change to urination. Denies nausea/vomiting. Denies fever, chills. Pain is still positional in nature, dull and aching although sharper with movements.   LMP was about 1.5 weeks ago.   HPI: HPI  Problems:  Patient Active Problem List   Diagnosis Date Noted   Diverticulitis 08/22/2022   Acute pelvic pain 08/22/2022   Papule 11/17/2021   Nonintractable episodic headache 11/17/2021   Class 1 obesity due to excess calories with serious comorbidity and body mass index (BMI) of 33.0 to 33.9 in adult 09/09/2020   Menorrhagia with regular  cycle 12/30/2018   GERD (gastroesophageal reflux disease) 08/15/2018   PCOS (polycystic ovarian syndrome) 11/22/2015   Calculus of kidney 12/30/2014   Fast heart beat 12/30/2014   Acute stress disorder 12/30/2014    Allergies:  Allergies  Allergen Reactions   Latex Swelling    Swelling- around catheter   Medications:  Current Outpatient Medications:    cyclobenzaprine (FLEXERIL) 10 MG tablet, Take 1 tablet (10 mg total) by mouth 3 (three) times daily as  needed., Disp: 15 tablet, Rfl: 0   meloxicam (MOBIC) 15 MG tablet, Take 1 tablet (15 mg total) by mouth daily., Disp: 30 tablet, Rfl: 0   omeprazole  (PRILOSEC) 40 MG capsule, TAKE 1 CAPSULE BY MOUTH EVERY DAY, Disp: 90 capsule, Rfl: 3   Polyethylene Glycol 3350  (MIRALAX  PO), Take by mouth. 1 cap full 1-2 times a week, Disp: , Rfl:   Observations/Objective: Patient is well-developed, well-nourished in no acute distress.  Resting comfortably  at home.  Head is normocephalic, atraumatic.  No labored breathing.  Speech is clear and coherent with logical content.  Patient is alert and oriented at baseline.    Assessment and Plan: 1. Acute left-sided low back pain without sciatica (Primary) - meloxicam (MOBIC) 15 MG tablet; Take 1 tablet (15 mg total) by mouth daily.  Dispense: 30 tablet; Refill: 0 - cyclobenzaprine (FLEXERIL) 10 MG tablet; Take 1 tablet (10 mg total) by mouth 3 (three) times daily as needed.  Dispense: 15 tablet; Refill: 0  Has history of diverticulitis and nephrolithiasis but current symptoms and descriptors makes these much less likely and more likely MSK in nature. Supportive measures and OTC medications reviewed. Meloxicam and Flexeril per orders. Ok to continue OTC Tylenol . PCP follow-up discussed. Strict UC/ER precautions reviewed.   Follow Up Instructions: I discussed the assessment and treatment plan with the patient. The patient was provided an opportunity to ask questions and all were answered. The patient agreed with the plan and demonstrated an understanding of the instructions.  A copy of instructions were sent to the patient via MyChart unless otherwise noted below.   The patient was advised to call back or seek an in-person evaluation if the symptoms worsen or if the condition fails to improve as anticipated.    Hyla Maillard, PA-C

## 2023-11-28 NOTE — Patient Instructions (Signed)
  Tanya Meza, thank you for joining Hyla Maillard, PA-C for today's virtual visit.  While this provider is not your primary care provider (PCP), if your PCP is located in our provider database this encounter information will be shared with them immediately following your visit.   A [facility] MyChart account gives you access to today's visit and all your visits, tests, and labs performed at [facility]  click here if you don't have a [facility] MyChart account or go to [email or web address]  Consent: (Patient) Tanya Meza provided verbal consent for this virtual visit at the beginning of the encounter.  Current Medications:  Current Outpatient Medications:    fluticasone  (FLONASE ) 50 MCG/ACT nasal spray, Place 2 sprays into both nostrils daily., Disp: 16 g, Rfl: 6   ipratropium (ATROVENT ) 0.03 % nasal spray, Place 2 sprays into both nostrils 2 (two) times daily., Disp: 30 mL, Rfl: 1   omeprazole  (PRILOSEC) 40 MG capsule, TAKE 1 CAPSULE BY MOUTH EVERY DAY, Disp: 90 capsule, Rfl: 3   Polyethylene Glycol 3350  (MIRALAX  PO), Take by mouth. 1 cap full 1-2 times a week, Disp: , Rfl:    SEMAGLUTIDE-WEIGHT MANAGEMENT [place], Inject into the skin. Per pt 1.06 mg ? Compounded injection, Disp: , Rfl:    Medications ordered in this encounter:  No orders of the defined types were placed in this encounter.    *If you need refills on other medications prior to your next appointment, please contact your pharmacy*  Follow-Up: Call back or seek an in-person evaluation if the symptoms worsen or if the condition fails to improve as anticipated.  [facility] [phone]  Other Instructions Avoid heavy lifting, pushing or pulling. Rest when possible. Continue use of heating pad in 10-15 minute intervals. Take the Meloxicam once daily with food. Ok to continue Tylenol  OTC The Flexeril can be used up to three times daily, but I would reserve for evening use as you  should not drive or operate machinery when taking the medication as it can make you sleepy.  If not resolving, please follow-up in person with your PCP. Urgent Care or ER for any acutely worsening symptoms despite treatment.   If you have been instructed to have an in-person evaluation today at a local Urgent Care facility, please use the link below. It will take you to a list of all of our available [facility], including address, phone number and hours of operation. Please do not delay care.  [facility]  If you or a family member do not have a primary care provider, use the link below to schedule a visit and establish care. When you choose a [facility] primary care physician or advanced practice provider, you gain a long-term partner in health. Find a Primary Care Provider  Learn more about [facility] in-office and virtual care options: [facility] - Get Care Now

## 2023-12-23 ENCOUNTER — Other Ambulatory Visit: Payer: Self-pay | Admitting: Physician Assistant

## 2023-12-23 DIAGNOSIS — K219 Gastro-esophageal reflux disease without esophagitis: Secondary | ICD-10-CM

## 2024-01-08 ENCOUNTER — Other Ambulatory Visit: Payer: Self-pay

## 2024-01-08 ENCOUNTER — Encounter: Payer: Self-pay | Admitting: Emergency Medicine

## 2024-01-08 ENCOUNTER — Emergency Department
Admission: EM | Admit: 2024-01-08 | Discharge: 2024-01-09 | Disposition: A | Discharge status: Home / Self Care | Attending: Emergency Medicine | Admitting: Emergency Medicine

## 2024-01-08 DIAGNOSIS — K5792 Diverticulitis of intestine, part unspecified, without perforation or abscess without bleeding: Secondary | ICD-10-CM | POA: Diagnosis not present

## 2024-01-08 DIAGNOSIS — K8020 Calculus of gallbladder without cholecystitis without obstruction: Secondary | ICD-10-CM | POA: Diagnosis not present

## 2024-01-08 DIAGNOSIS — D72829 Elevated white blood cell count, unspecified: Secondary | ICD-10-CM | POA: Diagnosis not present

## 2024-01-08 DIAGNOSIS — R1032 Left lower quadrant pain: Secondary | ICD-10-CM | POA: Diagnosis present

## 2024-01-08 DIAGNOSIS — K5732 Diverticulitis of large intestine without perforation or abscess without bleeding: Secondary | ICD-10-CM | POA: Diagnosis not present

## 2024-01-08 DIAGNOSIS — K449 Diaphragmatic hernia without obstruction or gangrene: Secondary | ICD-10-CM | POA: Diagnosis not present

## 2024-01-08 DIAGNOSIS — K429 Umbilical hernia without obstruction or gangrene: Secondary | ICD-10-CM | POA: Diagnosis not present

## 2024-01-08 LAB — CBC
HCT: 37.9 % (ref 36.0–46.0)
Hemoglobin: 12.4 g/dL (ref 12.0–15.0)
MCH: 28.1 pg (ref 26.0–34.0)
MCHC: 32.7 g/dL (ref 30.0–36.0)
MCV: 85.7 fL (ref 80.0–100.0)
Platelets: 307 K/uL (ref 150–400)
RBC: 4.42 MIL/uL (ref 3.87–5.11)
RDW: 14 % (ref 11.5–15.5)
WBC: 11.2 K/uL — ABNORMAL HIGH (ref 4.0–10.5)
nRBC: 0 % (ref 0.0–0.2)

## 2024-01-08 LAB — COMPREHENSIVE METABOLIC PANEL WITH GFR
ALT: 66 U/L — ABNORMAL HIGH (ref 0–44)
AST: 47 U/L — ABNORMAL HIGH (ref 15–41)
Albumin: 3.7 g/dL (ref 3.5–5.0)
Alkaline Phosphatase: 67 U/L (ref 38–126)
Anion gap: 10 (ref 5–15)
BUN: 15 mg/dL (ref 6–20)
CO2: 22 mmol/L (ref 22–32)
Calcium: 8.8 mg/dL — ABNORMAL LOW (ref 8.9–10.3)
Chloride: 104 mmol/L (ref 98–111)
Creatinine, Ser: 0.72 mg/dL (ref 0.44–1.00)
GFR, Estimated: >60 mL/min (ref >60)
Glucose, Bld: 102 mg/dL — ABNORMAL HIGH (ref 70–99)
Potassium: 3.9 mmol/L (ref 3.5–5.1)
Sodium: 136 mmol/L (ref 135–145)
Total Bilirubin: 0.6 mg/dL (ref 0.0–1.2)
Total Protein: 7 g/dL (ref 6.5–8.1)

## 2024-01-08 LAB — URINALYSIS, ROUTINE W REFLEX MICROSCOPIC
Bilirubin Urine: NEGATIVE
Glucose, UA: NEGATIVE mg/dL
Hgb urine dipstick: NEGATIVE
Ketones, ur: NEGATIVE mg/dL
Leukocytes,Ua: NEGATIVE
Nitrite: NEGATIVE
Protein, ur: NEGATIVE mg/dL
Specific Gravity, Urine: 1.015 (ref 1.005–1.030)
pH: 7 (ref 5.0–8.0)

## 2024-01-08 LAB — LIPASE, BLOOD: Lipase: 29 U/L (ref 11–51)

## 2024-01-08 LAB — POC URINE PREG, ED: Preg Test, Ur: NEGATIVE

## 2024-01-08 NOTE — ED Triage Notes (Signed)
 Pt to ED from home c/o lower mid abd cramping and worse with movement x2 days.  Urinary frequency, denies n/v/d.  Hx of diverticulitis and feels similar.

## 2024-01-09 ENCOUNTER — Emergency Department

## 2024-01-09 DIAGNOSIS — K429 Umbilical hernia without obstruction or gangrene: Secondary | ICD-10-CM | POA: Diagnosis not present

## 2024-01-09 DIAGNOSIS — K8020 Calculus of gallbladder without cholecystitis without obstruction: Secondary | ICD-10-CM | POA: Diagnosis not present

## 2024-01-09 DIAGNOSIS — K5732 Diverticulitis of large intestine without perforation or abscess without bleeding: Secondary | ICD-10-CM | POA: Diagnosis not present

## 2024-01-09 DIAGNOSIS — K449 Diaphragmatic hernia without obstruction or gangrene: Secondary | ICD-10-CM | POA: Diagnosis not present

## 2024-01-09 MED ORDER — MORPHINE SULFATE (PF) 4 MG/ML IV SOLN
4.0000 mg | Freq: Once | INTRAVENOUS | Status: AC
  Administered 2024-01-09: 4 mg via INTRAVENOUS
  Filled 2024-01-09: qty 1

## 2024-01-09 MED ORDER — ONDANSETRON 4 MG PO TBDP: 4.0000 mg | ORAL_TABLET | Freq: Four times a day (QID) | ORAL | 0 refills | Status: DC | PRN

## 2024-01-09 MED ORDER — ONDANSETRON HCL 4 MG/2ML IJ SOLN
4.0000 mg | Freq: Once | INTRAMUSCULAR | Status: AC
  Administered 2024-01-09: 4 mg via INTRAVENOUS
  Filled 2024-01-09: qty 2

## 2024-01-09 MED ORDER — PIPERACILLIN-TAZOBACTAM 3.375 G IVPB 30 MIN
3.3750 g | Freq: Once | INTRAVENOUS | Status: AC
  Administered 2024-01-09: 3.375 g via INTRAVENOUS
  Filled 2024-01-09: qty 50

## 2024-01-09 MED ORDER — AMOXICILLIN-POT CLAVULANATE 875-125 MG PO TABS: 1.0000 | ORAL_TABLET | Freq: Two times a day (BID) | ORAL | 0 refills | Status: AC

## 2024-01-09 MED ORDER — OXYCODONE HCL 5 MG PO TABS: 5.0000 mg | ORAL_TABLET | Freq: Three times a day (TID) | ORAL | 0 refills | Status: DC | PRN

## 2024-01-09 MED ORDER — SODIUM CHLORIDE 0.9 % IV BOLUS (SEPSIS)
1000.0000 mL | Freq: Once | INTRAVENOUS | Status: AC
  Administered 2024-01-09: 1000 mL via INTRAVENOUS

## 2024-01-09 MED ORDER — IOHEXOL 300 MG/ML  SOLN
100.0000 mL | Freq: Once | INTRAMUSCULAR | Status: AC | PRN
  Administered 2024-01-09: 100 mL via INTRAVENOUS

## 2024-01-09 NOTE — Discharge Instructions (Addendum)

## 2024-01-09 NOTE — ED Provider Notes (Signed)
 [facility] Provider Note    Event Date/Time   First MD Initiated Contact with Patient 01/08/24 2348     (approximate)   History   Abdominal Pain   HPI  Tanya Meza is a 43 y.o. female with history of PCOS, nephrolithiasis, GERD, prior episode of diverticulitis, cesarean section x 3, uterine ablation who presents to the emergency department with left lower quadrant abdominal pain that started 2 days ago.  No fevers, vomiting, diarrhea, bloody stools or melena, dysuria or hematuria, vaginal bleeding or discharge.  Feels similar to prior episode of diverticulitis somewhat.   History provided by patient, daughter.    Past Medical History:  Diagnosis Date   Complication of anesthesia    GERD (gastroesophageal reflux disease)    with preg, on zantac   Nephrolithiasis    no surgery required   PCOS (polycystic ovarian syndrome)    Tachycardia    RVOF    Past Surgical History:  Procedure Laterality Date   CESAREAN SECTION  2005, 2008   x3   CESAREAN SECTION WITH BILATERAL TUBAL LIGATION     COLONOSCOPY N/A 12/22/2021   Procedure: COLONOSCOPY;  Surgeon: Onita Elspeth Sharper, DO;  Location: [facility] ENDOSCOPY;  Service: Gastroenterology;  Laterality: N/A;   DILITATION & CURRETTAGE/HYSTROSCOPY WITH HYDROTHERMAL ABLATION N/A 05/06/2019   Procedure: DILATATION & CURETTAGE/HYSTEROSCOPY WITH HYDROTHERMAL ABLATION;  Surgeon: Fredirick Glenys RAMAN, MD;  Location: [facility];  Service: Gynecology;  Laterality: N/A;   ESOPHAGOGASTRODUODENOSCOPY N/A 10/06/2021   Procedure: ESOPHAGOGASTRODUODENOSCOPY (EGD);  Surgeon: Onita Elspeth Sharper, DO;  Location: [facility] ENDOSCOPY;  Service: Gastroenterology;  Laterality: N/A;   ESOPHAGOGASTRODUODENOSCOPY N/A 12/22/2021   Procedure: ESOPHAGOGASTRODUODENOSCOPY (EGD);  Surgeon: Onita Elspeth Sharper, DO;  Location: [facility] ENDOSCOPY;  Service: Gastroenterology;  Laterality: N/A;   WISDOM TOOTH EXTRACTION      MEDICATIONS:   Prior to Admission medications   Medication Sig Start Date End Date Taking? Authorizing Provider  cyclobenzaprine  (FLEXERIL ) 10 MG tablet Take 1 tablet (10 mg total) by mouth 3 (three) times daily as needed. 11/28/23   Gladis Elsie BROCKS, PA-C  meloxicam  (MOBIC ) 15 MG tablet Take 1 tablet (15 mg total) by mouth daily. 11/28/23   Gladis Elsie BROCKS, PA-C  omeprazole  (PRILOSEC) 40 MG capsule TAKE 1 CAPSULE BY MOUTH EVERY DAY 12/23/23   Ostwalt, Janna, PA-C  Polyethylene Glycol 3350  (MIRALAX  PO) Take by mouth. 1 cap full 1-2 times a week    [provider]    Physical Exam   Triage Vital Signs: ED Triage Vitals [01/08/24 2158]  Encounter Vitals Group     BP (!) 140/88     Girls Systolic BP Percentile      Girls Diastolic BP Percentile      Boys Systolic BP Percentile      Boys Diastolic BP Percentile      Pulse Rate 97     Resp 16     Temp 98.6 F (37 C)     Temp Source Oral     SpO2 99 %     Weight 189 lb (85.7 kg)     Height 5' 5 (1.651 m)     Head Circumference      Peak Flow      Pain Score 8     Pain Loc      Pain Education      Exclude from Growth Chart     Most recent vital signs: Vitals:   01/08/24 2158  BP: (!) 140/88  Pulse: 97  Resp: 16  Temp: 98.6 F (37 C)  SpO2: 99%    CONSTITUTIONAL: Alert, responds appropriately to questions. Well-appearing; well-nourished HEAD: Normocephalic, atraumatic EYES: Conjunctivae clear, pupils appear equal, sclera nonicteric ENT: normal nose; moist mucous membranes NECK: Supple, normal ROM CARD: RRR; S1 and S2 appreciated RESP: Normal chest excursion without splinting or tachypnea; breath sounds clear and equal bilaterally; no wheezes, no rhonchi, no rales, no hypoxia or respiratory distress, speaking full sentences ABD/GI: Non-distended; soft, tender in the left lower quadrant with voluntary guarding, no rebound BACK: The back appears normal EXT: Normal ROM in all joints; no deformity noted, no edema SKIN: Normal  color for age and race; warm; no rash on exposed skin NEURO: Moves all extremities equally, normal speech PSYCH: The patient's mood and manner are appropriate.   ED Results / Procedures / Treatments   LABS: (all labs ordered are listed, but only abnormal results are displayed) Labs Reviewed  COMPREHENSIVE METABOLIC PANEL WITH GFR - Abnormal; Notable for the following components:      Result Value   Glucose, Bld 102 (*)    Calcium 8.8 (*)    AST 47 (*)    ALT 66 (*)    All other components within normal limits  CBC - Abnormal; Notable for the following components:   WBC 11.2 (*)    All other components within normal limits  URINALYSIS, ROUTINE W REFLEX MICROSCOPIC - Abnormal; Notable for the following components:   Color, Urine YELLOW (*)    APPearance CLEAR (*)    All other components within normal limits  LIPASE, BLOOD  POC URINE PREG, ED     EKG:   RADIOLOGY: My personal review and interpretation of imaging: CT scan shows uncomplicated diverticulitis.  Nephrolithiasis without ureterolithiasis.  Cholelithiasis without cholecystitis.  I have personally reviewed all radiology reports.   CT ABDOMEN PELVIS W CONTRAST Result Date: 01/09/2024 CLINICAL DATA:  Left lower quadrant pain, cramping and urinary frequency. History of diverticulitis. Feels similar to prior episodes. EXAM: CT ABDOMEN AND PELVIS WITH CONTRAST TECHNIQUE: Multidetector CT imaging of the abdomen and pelvis was performed using the standard protocol following bolus administration of intravenous contrast. RADIATION DOSE REDUCTION: This exam was performed according to the departmental dose-optimization program which includes automated exposure control, adjustment of the mA and/or kV according to patient size and/or use of iterative reconstruction technique. CONTRAST:  OMNIPAQUE  IOHEXOL  300 MG/ML  SOLN COMPARISON:  CTs with contrast 07/06/2022 and 10/11/2017. FINDINGS: Lower chest: There is mild posterior  atelectasis in the lung bases, slight elevation right hemidiaphragm. No acute lower thoracic findings. Normal cardiac size. Small hiatal hernia. Hepatobiliary: Gallbladder is contracted and not well seen but there are no inflammatory changes around it. There is a small rod-like stone in the mid lumen. There is no biliary dilatation. There is no focal liver abnormality. Pancreas: No abnormality. Spleen: No abnormality.  No splenomegaly. Adrenals/Urinary Tract: There is no adrenal or renal mass enhancement. There are nonobstructive caliceal stones in the lower pole of the left kidney measuring 3 mm and 1 mm. No nephrolithiasis is seen on the right. There is no ureteral stone or hydronephrosis bilaterally. Stomach/Bowel: Unremarkable gastric wall. Normal caliber small bowel without wall thickening or inflammation. Normal subcecal appendix. There is mild fecal stasis ascending and proximal transverse colon. The colon wall is normal in thickness until the proximal third of the sigmoid segment, where there is advanced diverticulosis with circumferential wall thickening inflammatory change. The findings are most likely  due to acute sigmoid diverticulitis although could be seen with an inflamed carcinoma. There was diverticulitis on 07/06/2022 affecting the more mid segment sigmoid. The today the rest of the colon wall has a normal thickness. Vascular/Lymphatic: No significant vascular findings are present. No enlarged abdominal or pelvic lymph nodes. Reproductive: 1.1 cm cystic focus, left side of the uterine fundus unchanged over prior studies, probable cystic fibroid. Right tubal ligation clip again seen. The ovaries are not enlarged. There is a 2 cm dominant follicle of the right ovary. Other: Minimal nonlocalizing reactive fluid collects posterior to the diseased proximal sigmoid. There is no free air, free hemorrhage, abscess or other localizing process. Small umbilical fat hernia. Small inguinal fat hernias. No  incarcerated hernia. There is surgical clip again seen posterior to segment 7 of the liver, same shape as the right BTL clip and is probably the left BTL clip which migrated there. Musculoskeletal: No acute or significant osseous findings. IMPRESSION: 1. Acute sigmoid diverticulitis involving the proximal third of the sigmoid segment. No free air, free hemorrhage, abscess or other localizing process. Suggest follow-up colonoscopy after treatment to exclude underlying lesion or repeat CT to ensure the wall thickening resolves. 2. Constipation. 3. Nonobstructive left nephrolithiasis. 4. Cholelithiasis. 5. Small hiatal hernia.  Small umbilical and inguinal fat hernias. 6. 1.1 cm cystic focus left side of the uterine fundus, probable cystic fibroid, stable. 7. 2 cm dominant follicle right ovary. 8. Surgical clip posterior to segment 7 of the liver, probably the left BTL clip which migrated there. This seen on both prior studies. Electronically Signed   By: Francis Quam M.D.   On: 01/09/2024 01:23     PROCEDURES:  Critical Care performed: No      Procedures    IMPRESSION / MDM / ASSESSMENT AND PLAN / ED COURSE  I reviewed the triage vital signs and the nursing notes.    Patient here with left lower quadrant pain.  History of kidney stones, diverticulitis.    DIFFERENTIAL DIAGNOSIS (includes but not limited to):   Diverticulitis, colitis, bowel obstruction, UTI, pyelonephritis, kidney stone, doubt appendicitis   Patient's presentation is most consistent with acute presentation with potential threat to life or bodily function.   PLAN: Labs obtained from triage show leukocytosis of 11.2.  Minimally elevated AST and ALT but no right upper quadrant tenderness on exam.  Normal lipase.  Urine shows no sign of infection, blood.  Will obtain CT of the abdomen pelvis for further evaluation.  Will give IV fluids, pain and nausea medicine.   MEDICATIONS GIVEN IN ED: Medications   piperacillin -tazobactam (ZOSYN ) IVPB 3.375 g (has no administration in time range)  morphine  (PF) 4 MG/ML injection 4 mg (has no administration in time range)  sodium chloride  0.9 % bolus 1,000 mL (1,000 mLs Intravenous New Bag/Given 01/09/24 0034)  morphine  (PF) 4 MG/ML injection 4 mg (4 mg Intravenous Given 01/09/24 0029)  ondansetron  (ZOFRAN ) injection 4 mg (4 mg Intravenous Given 01/09/24 0027)  iohexol  (OMNIPAQUE ) 300 MG/ML solution 100 mL (100 mLs Intravenous Contrast Given 01/09/24 0048)     ED COURSE: CT scan reviewed and interpreted by myself and the radiologist and shows acute sigmoid diverticulitis without perforation, abscess.  Will give IV Zosyn .  She also has noted to have nephrolithiasis, cholelithiasis, uterine fibroid.  No signs of cholecystitis.  Again she is not having any pain in the upper abdomen.  Offered patient admission however she is comfortable plan for discharge home with oral antibiotics, oral pain medication.  Recommended follow-up with her PCP in 1 week to ensure resolution of symptoms.  At this time, I do not feel there is any life-threatening condition present. I reviewed all nursing notes, vitals, pertinent previous records.  All lab and urine results, EKGs, imaging ordered have been independently reviewed and interpreted by myself.  I reviewed all available radiology reports from any imaging ordered this visit.  Based on my assessment, I feel the patient is safe to be discharged home without further emergent workup and can continue workup as an outpatient as needed. Discussed all findings, treatment plan as well as usual and customary return precautions.  They verbalize understanding and are comfortable with this plan.  Outpatient follow-up has been provided as needed.  All questions have been answered.    CONSULTS: Admission offered however patient declines.   OUTSIDE RECORDS REVIEWED: Reviewed last OB/GYN and PCP visits in November 2024, June  2025.       FINAL CLINICAL IMPRESSION(S) / ED DIAGNOSES   Final diagnoses:  Diverticulitis     Rx / DC Orders   ED Discharge Orders          Ordered    amoxicillin -clavulanate (AUGMENTIN ) 875-125 MG tablet  2 times daily        01/09/24 0143    ondansetron  (ZOFRAN -ODT) 4 MG disintegrating tablet  Every 6 hours PRN        01/09/24 0143    oxyCODONE  (ROXICODONE ) 5 MG immediate release tablet  Every 8 hours PRN        01/09/24 0143             Note:  This document was prepared using Dragon voice recognition software and may include unintentional dictation errors.   Angelisse Riso, Josette SAILOR, DO 01/09/24 0157

## 2024-02-20 ENCOUNTER — Other Ambulatory Visit: Payer: Self-pay | Admitting: Physician Assistant

## 2024-02-20 DIAGNOSIS — Z1231 Encounter for screening mammogram for malignant neoplasm of breast: Secondary | ICD-10-CM

## 2024-02-28 ENCOUNTER — Ambulatory Visit
Admission: RE | Admit: 2024-02-28 | Discharge: 2024-02-28 | Disposition: A | Discharge status: Home / Self Care | Source: Ambulatory Visit | Attending: Physician Assistant

## 2024-02-28 DIAGNOSIS — Z1231 Encounter for screening mammogram for malignant neoplasm of breast: Secondary | ICD-10-CM | POA: Diagnosis not present

## 2024-03-04 ENCOUNTER — Other Ambulatory Visit: Payer: Self-pay

## 2024-03-04 ENCOUNTER — Encounter: Payer: Self-pay | Admitting: *Deleted

## 2024-03-04 ENCOUNTER — Emergency Department
Admission: EM | Admit: 2024-03-04 | Discharge: 2024-03-05 | Disposition: A | Discharge status: Home / Self Care | Attending: Emergency Medicine | Admitting: Emergency Medicine

## 2024-03-04 ENCOUNTER — Other Ambulatory Visit: Payer: Self-pay | Admitting: Physician Assistant

## 2024-03-04 ENCOUNTER — Ambulatory Visit: Payer: Self-pay | Admitting: Physician Assistant

## 2024-03-04 DIAGNOSIS — R928 Other abnormal and inconclusive findings on diagnostic imaging of breast: Secondary | ICD-10-CM

## 2024-03-04 DIAGNOSIS — K449 Diaphragmatic hernia without obstruction or gangrene: Secondary | ICD-10-CM | POA: Diagnosis not present

## 2024-03-04 DIAGNOSIS — K5900 Constipation, unspecified: Secondary | ICD-10-CM | POA: Insufficient documentation

## 2024-03-04 DIAGNOSIS — K5732 Diverticulitis of large intestine without perforation or abscess without bleeding: Secondary | ICD-10-CM | POA: Diagnosis not present

## 2024-03-04 DIAGNOSIS — R1013 Epigastric pain: Secondary | ICD-10-CM

## 2024-03-04 DIAGNOSIS — K8020 Calculus of gallbladder without cholecystitis without obstruction: Secondary | ICD-10-CM | POA: Diagnosis not present

## 2024-03-04 DIAGNOSIS — K429 Umbilical hernia without obstruction or gangrene: Secondary | ICD-10-CM | POA: Diagnosis not present

## 2024-03-04 LAB — COMPREHENSIVE METABOLIC PANEL WITH GFR
ALT: 48 U/L — ABNORMAL HIGH (ref 0–44)
AST: 75 U/L — ABNORMAL HIGH (ref 15–41)
Albumin: 4 g/dL (ref 3.5–5.0)
Alkaline Phosphatase: 63 U/L (ref 38–126)
Anion gap: 14 (ref 5–15)
BUN: 18 mg/dL (ref 6–20)
CO2: 25 mmol/L (ref 22–32)
Calcium: 9.2 mg/dL (ref 8.9–10.3)
Chloride: 97 mmol/L — ABNORMAL LOW (ref 98–111)
Creatinine, Ser: 0.66 mg/dL (ref 0.44–1.00)
GFR, Estimated: >60 mL/min (ref >60)
Glucose, Bld: 148 mg/dL — ABNORMAL HIGH (ref 70–99)
Potassium: 3.9 mmol/L (ref 3.5–5.1)
Sodium: 136 mmol/L (ref 135–145)
Total Bilirubin: 0.8 mg/dL (ref 0.0–1.2)
Total Protein: 7.6 g/dL (ref 6.5–8.1)

## 2024-03-04 LAB — CBC
HCT: 41.3 % (ref 36.0–46.0)
Hemoglobin: 13.3 g/dL (ref 12.0–15.0)
MCH: 28 pg (ref 26.0–34.0)
MCHC: 32.2 g/dL (ref 30.0–36.0)
MCV: 86.9 fL (ref 80.0–100.0)
Platelets: 387 K/uL (ref 150–400)
RBC: 4.75 MIL/uL (ref 3.87–5.11)
RDW: 13.4 % (ref 11.5–15.5)
WBC: 14.3 K/uL — ABNORMAL HIGH (ref 4.0–10.5)
nRBC: 0 % (ref 0.0–0.2)

## 2024-03-04 LAB — URINALYSIS, ROUTINE W REFLEX MICROSCOPIC
Bilirubin Urine: NEGATIVE
Glucose, UA: NEGATIVE mg/dL
Hgb urine dipstick: NEGATIVE
Ketones, ur: NEGATIVE mg/dL
Leukocytes,Ua: NEGATIVE
Nitrite: NEGATIVE
Protein, ur: NEGATIVE mg/dL
Specific Gravity, Urine: 1.024 (ref 1.005–1.030)
pH: 5 (ref 5.0–8.0)

## 2024-03-04 LAB — LIPASE, BLOOD: Lipase: 35 U/L (ref 11–51)

## 2024-03-04 LAB — TROPONIN I (HIGH SENSITIVITY): Troponin I (High Sensitivity): 5 ng/L (ref <18)

## 2024-03-04 MED ORDER — LIDOCAINE VISCOUS HCL 2 % MT SOLN
15.0000 mL | Freq: Once | OROMUCOSAL | Status: AC
  Administered 2024-03-05: 15 mL via ORAL
  Filled 2024-03-04: qty 15

## 2024-03-04 MED ORDER — PANTOPRAZOLE SODIUM 40 MG IV SOLR
40.0000 mg | Freq: Once | INTRAVENOUS | Status: AC
  Administered 2024-03-04: 40 mg via INTRAVENOUS
  Filled 2024-03-04: qty 10

## 2024-03-04 MED ORDER — ALUM & MAG HYDROXIDE-SIMETH 200-200-20 MG/5ML PO SUSP
30.0000 mL | Freq: Once | ORAL | Status: AC
  Administered 2024-03-05: 30 mL via ORAL
  Filled 2024-03-04: qty 30

## 2024-03-04 MED ORDER — SODIUM CHLORIDE 0.9 % IV BOLUS
1000.0000 mL | Freq: Once | INTRAVENOUS | Status: AC
  Administered 2024-03-04: 1000 mL via INTRAVENOUS

## 2024-03-04 MED ORDER — ONDANSETRON HCL 4 MG/2ML IJ SOLN
4.0000 mg | Freq: Once | INTRAMUSCULAR | Status: AC
  Administered 2024-03-04: 4 mg via INTRAVENOUS
  Filled 2024-03-04: qty 2

## 2024-03-04 MED ORDER — MORPHINE SULFATE (PF) 4 MG/ML IV SOLN
4.0000 mg | Freq: Once | INTRAVENOUS | Status: AC
  Administered 2024-03-04: 4 mg via INTRAVENOUS
  Filled 2024-03-04: qty 1

## 2024-03-04 NOTE — ED Provider Notes (Signed)
 [facility] Provider Note    Event Date/Time   First MD Initiated Contact with Patient 03/04/24 2331     (approximate)   History   Abdominal Pain   HPI  Tanya Meza is a 43 y.o. female   Past medical history of GERD and reported esophagitis, kidney stones, presents to Emergency Department with epigastric pain.  Worsening over the last couple of days.  Constant, nonradiating.  Nausea and vomiting 1 time.  Denies chest pain or shortness of breath.  No respiratory infectious symptoms.  No fever.  No GI bleeding.  Bowel movements normal.  No urinary symptoms.  Takes PPI daily.  No significant alcohol or drug use.   External Medical Documents Reviewed: Upper endoscopy from 2023 reviewed      Physical Exam   Triage Vital Signs: ED Triage Vitals  Encounter Vitals Group     BP --      Girls Systolic BP Percentile --      Girls Diastolic BP Percentile --      Boys Systolic BP Percentile --      Boys Diastolic BP Percentile --      Pulse Rate 03/04/24 2052 (!) 107     Resp 03/04/24 2052 (!) 22     Temp 03/04/24 2052 97.9 F (36.6 C)     Temp Source 03/04/24 2052 Oral     SpO2 03/04/24 2052 97 %     Weight 03/04/24 2049 185 lb (83.9 kg)     Height 03/04/24 2049 5' 5 (1.651 m)     Head Circumference --      Peak Flow --      Pain Score 03/04/24 2049 10     Pain Loc --      Pain Education --      Exclude from Growth Chart --     Most recent vital signs: Vitals:   03/04/24 2052 03/05/24 0000  BP:  123/62  Pulse: (!) 107 66  Resp: (!) 22   Temp: 97.9 F (36.6 C)   SpO2: 97% 100%    General: Awake, no distress.  CV:  Good peripheral perfusion.  Resp:  Normal effort.  Abd:  No distention.  Other:  Epigastric tenderness to palpation, mild without peritonitis.  Other quadrants benign.   ED Results / Procedures / Treatments   Labs (all labs ordered are listed, but only abnormal results are displayed) Labs Reviewed   COMPREHENSIVE METABOLIC PANEL WITH GFR - Abnormal; Notable for the following components:      Result Value   Chloride 97 (*)    Glucose, Bld 148 (*)    AST 75 (*)    ALT 48 (*)    All other components within normal limits  CBC - Abnormal; Notable for the following components:   WBC 14.3 (*)    All other components within normal limits  URINALYSIS, ROUTINE W REFLEX MICROSCOPIC - Abnormal; Notable for the following components:   Color, Urine YELLOW (*)    APPearance CLEAR (*)    All other components within normal limits  LIPASE, BLOOD  PREGNANCY, URINE  POC URINE PREG, ED  TROPONIN I (HIGH SENSITIVITY)  TROPONIN I (HIGH SENSITIVITY)     I ordered and reviewed the above labs they are notable for very mild LFT elevations, alk phos and bilirubin normal.  White blood cell count elevated at 14.3.  Urinalysis without signs of infection.  Serial troponins negative.  Not pregnant.  EKG  ED  ECG REPORT I, Ginnie Shams, the attending physician, personally viewed and interpreted this ECG.   Date: 03/04/2024  EKG Time: 2059  Rate: 82  Rhythm: sinus  Axis: nl  Intervals:nl  ST&T Change: no stemi    RADIOLOGY I independently reviewed and interpreted CT of the abdomen and see no obvious obstructive or inflammatory changes I also reviewed radiologist's formal read.   PROCEDURES:  Critical Care performed: No  Procedures   MEDICATIONS ORDERED IN ED: Medications  sodium chloride  0.9 % bolus 1,000 mL (1,000 mLs Intravenous New Bag/Given 03/04/24 2356)  ondansetron  (ZOFRAN ) injection 4 mg (4 mg Intravenous Given 03/04/24 2357)  morphine  (PF) 4 MG/ML injection 4 mg (4 mg Intravenous Given 03/04/24 2357)  pantoprazole  (PROTONIX ) injection 40 mg (40 mg Intravenous Given 03/04/24 2357)  alum & mag hydroxide-simeth (MAALOX/MYLANTA) 200-200-20 MG/5ML suspension 30 mL (30 mLs Oral Given 03/05/24 0031)    And  lidocaine  (XYLOCAINE ) 2 % viscous mouth solution 15 mL (15 mLs Oral Given 03/05/24  0031)  iohexol  (OMNIPAQUE ) 300 MG/ML solution 100 mL (100 mLs Intravenous Contrast Given 03/05/24 0043)     IMPRESSION / MDM / ASSESSMENT AND PLAN / ED COURSE  I reviewed the triage vital signs and the nursing notes.                                Patient's presentation is most consistent with acute presentation with potential threat to life or bodily function.  Differential diagnosis includes, but is not limited to, biliary colic, cholecystitis, pancreatitis, gastritis/GERD/ulcer, considered but less likely ACS PE dissection or other cardiopulmonary emergencies   The patient is on the cardiac monitor to evaluate for evidence of arrhythmia and/or significant heart rate changes.  MDM:    Epigastric pain this patient with GERD who is already on a PPI.  Overall pretty benign abdominal exam.  Considered other surgical abdominal pathologies like cholecystitis, perforated viscus, and best to check with CT.  CT with various nonemergency findings that may account for the patient's pain.  These include constipation, hiatal hernia, and a single small gallstone.  There is no evidence of cholecystitis.  There is no evidence of complete obstruction or incarcerated or strangulated hernia.  I think these possibilities can be addressed as an outpatient.  I will have her contact general surgery for the cholelithiasis and hiatal hernia for outpatient evaluation and management.  I will start her on additional antacids for possibility of gastritis.  I will have her take MiraLAX  for constipation.  I considered hospitalization for admission or observation however given her stability in the Emergency Department with negative emergent life-threatening findings as above I think she can follow-up as an outpatient as above.        FINAL CLINICAL IMPRESSION(S) / ED DIAGNOSES   Final diagnoses:  Epigastric pain  Hiatal hernia  Constipation, unspecified constipation type  Calculus of gallbladder without  cholecystitis without obstruction     Rx / DC Orders   ED Discharge Orders          Ordered    sucralfate  (CARAFATE ) 1 GM/10ML suspension  4 times daily        03/05/24 0117             Note:  This document was prepared using Dragon voice recognition software and may include unintentional dictation errors.    Shams Ginnie, MD 03/05/24 [phone]

## 2024-03-04 NOTE — ED Provider Notes (Incomplete)
 [facility] Provider Note    Event Date/Time   First MD Initiated Contact with Patient 03/04/24 2331     (approximate)   History   Abdominal Pain   HPI  Tanya Meza is a 43 y.o. female   Past medical history of ***    Independent Historian contributed to assessment above: ***  External Medical Documents Reviewed: ***      Physical Exam   Triage Vital Signs: ED Triage Vitals  Encounter Vitals Group     BP --      Girls Systolic BP Percentile --      Girls Diastolic BP Percentile --      Boys Systolic BP Percentile --      Boys Diastolic BP Percentile --      Pulse Rate 03/04/24 2052 (!) 107     Resp 03/04/24 2052 (!) 22     Temp 03/04/24 2052 97.9 F (36.6 C)     Temp Source 03/04/24 2052 Oral     SpO2 03/04/24 2052 97 %     Weight 03/04/24 2049 185 lb (83.9 kg)     Height 03/04/24 2049 5' 5 (1.651 m)     Head Circumference --      Peak Flow --      Pain Score 03/04/24 2049 10     Pain Loc --      Pain Education --      Exclude from Growth Chart --     Most recent vital signs: Vitals:   03/04/24 2052  Pulse: (!) 107  Resp: (!) 22  Temp: 97.9 F (36.6 C)  SpO2: 97%    General: Awake, no distress. *** CV:  Good peripheral perfusion. *** Resp:  Normal effort. *** Abd:  No distention. *** Other:  ***   ED Results / Procedures / Treatments   Labs (all labs ordered are listed, but only abnormal results are displayed) Labs Reviewed  COMPREHENSIVE METABOLIC PANEL WITH GFR - Abnormal; Notable for the following components:      Result Value   Chloride 97 (*)    Glucose, Bld 148 (*)    AST 75 (*)    ALT 48 (*)    All other components within normal limits  CBC - Abnormal; Notable for the following components:   WBC 14.3 (*)    All other components within normal limits  URINALYSIS, ROUTINE W REFLEX MICROSCOPIC - Abnormal; Notable for the following components:   Color, Urine YELLOW (*)    APPearance CLEAR (*)     All other components within normal limits  LIPASE, BLOOD  POC URINE PREG, ED  TROPONIN I (HIGH SENSITIVITY)  TROPONIN I (HIGH SENSITIVITY)     I ordered and reviewed the above labs they are notable for ***  EKG  ED ECG REPORT I, Ginnie Shams, the attending physician, personally viewed and interpreted this ECG.   Date: 03/04/2024  EKG Time: ***  Rate: ***  Rhythm: {ekg findings:315101}  Axis: ***  Intervals:{conduction defects:17367}  ST&T Change: ***    RADIOLOGY I independently reviewed and interpreted *** I also reviewed radiologist's formal read.   PROCEDURES:  Critical Care performed: {CriticalCareYesNo:19197::Yes, see critical care procedure note(s),No}  Procedures   MEDICATIONS ORDERED IN ED: Medications - No data to display  External physician / consultants:  I spoke with *** regarding care plan for this patient.   IMPRESSION / MDM / ASSESSMENT AND PLAN / ED COURSE  I reviewed the triage  vital signs and the nursing notes.                                Patient's presentation is most consistent with {EM COPA:27473}  Differential diagnosis includes, but is not limited to, ***   ***The patient is on the cardiac monitor to evaluate for evidence of arrhythmia and/or significant heart rate changes.  MDM:  ***  I considered hospitalization for admission or observation ***        FINAL CLINICAL IMPRESSION(S) / ED DIAGNOSES   Final diagnoses:  None     Rx / DC Orders   ED Discharge Orders     None        Note:  This document was prepared using Dragon voice recognition software and may include unintentional dictation errors.

## 2024-03-04 NOTE — ED Triage Notes (Addendum)
 Pt ambulatory to triage.  Pt has upper abd pain since 1730 today.  Pt taking otc meds without relief.  Pt vomited x1   no diarrhea.  No chest  pain or sob.  Pt tearful in triage.  Pt alert.  Hx gastric reflux and diverticulitis

## 2024-03-05 ENCOUNTER — Emergency Department

## 2024-03-05 DIAGNOSIS — K449 Diaphragmatic hernia without obstruction or gangrene: Secondary | ICD-10-CM | POA: Diagnosis not present

## 2024-03-05 DIAGNOSIS — K5732 Diverticulitis of large intestine without perforation or abscess without bleeding: Secondary | ICD-10-CM | POA: Diagnosis not present

## 2024-03-05 DIAGNOSIS — K429 Umbilical hernia without obstruction or gangrene: Secondary | ICD-10-CM | POA: Diagnosis not present

## 2024-03-05 DIAGNOSIS — K8020 Calculus of gallbladder without cholecystitis without obstruction: Secondary | ICD-10-CM | POA: Diagnosis not present

## 2024-03-05 LAB — TROPONIN I (HIGH SENSITIVITY): Troponin I (High Sensitivity): 5 ng/L (ref <18)

## 2024-03-05 LAB — POC URINE PREG, ED
Preg Test, Ur: NEGATIVE
Preg Test, Ur: NEGATIVE

## 2024-03-05 LAB — PREGNANCY, URINE: Preg Test, Ur: NEGATIVE

## 2024-03-05 MED ORDER — SUCRALFATE 1 GM/10ML PO SUSP: 1.0000 g | Freq: Four times a day (QID) | ORAL | 1 refills | Status: DC

## 2024-03-05 MED ORDER — IOHEXOL 300 MG/ML  SOLN
100.0000 mL | Freq: Once | INTRAMUSCULAR | Status: AC | PRN
  Administered 2024-03-05: 100 mL via INTRAVENOUS

## 2024-03-05 NOTE — Discharge Instructions (Signed)
 Fortunately your evaluation in the Emergency Department did not show any emergency conditions that would require hospitalization or surgery at this time.  There were a number of conditions that were identified from your workup that may contribute to your symptoms.  Primarily I think that your symptoms may be due to stomach acid.  Continue taking your omeprazole  daily as prescribed and also use Carafate  which may be helpful.  There were a couple of findings on your CT scan that may be contributing as well.  They look to be extra stool in your colon, which may reflect constipation.  You can find MiraLAX  over-the-counter and take 2 capfuls daily to help with this problem.  Additionally there was a hiatal hernia that was identified on your CT scan-this is a problem that is not an emergency but may cause discomfort.  There was also 1 single small gallbladder stone that is not causing a blockage or infection at this time.  It is possible but unlikely that this is causing her pain.  Both of these conditions can be discussed with general surgeon Dr. Tye, please give his office a call in the morning to schedule an appointment to discuss these conditions and possible further evaluation or treatment as needed.  Thank you for choosing us  for your health care today!  Please see your primary doctor this week for a follow up appointment.   If you have any new, worsening, or unexpected symptoms call your doctor right away or come back to the emergency department for reevaluation.  It was my pleasure to care for you today.   Ginnie EDISON Cyrena, MD

## 2024-03-05 NOTE — ED Notes (Signed)
 Pt Dc'd by this RN due to primary RN doing other pt care. Pt verbalized understanding of medication and follow up care. Pt ambulatory from ED without difficulty.

## 2024-03-10 ENCOUNTER — Ambulatory Visit
Admission: RE | Admit: 2024-03-10 | Discharge: 2024-03-10 | Disposition: A | Discharge status: Home / Self Care | Source: Ambulatory Visit | Attending: Physician Assistant

## 2024-03-10 ENCOUNTER — Encounter

## 2024-03-10 ENCOUNTER — Ambulatory Visit
Admission: RE | Admit: 2024-03-10 | Discharge: 2024-03-10 | Disposition: A | Discharge status: Home / Self Care | Source: Ambulatory Visit | Attending: Physician Assistant | Admitting: Physician Assistant

## 2024-03-10 ENCOUNTER — Ambulatory Visit: Payer: Self-pay | Admitting: Physician Assistant

## 2024-03-10 ENCOUNTER — Other Ambulatory Visit

## 2024-03-10 DIAGNOSIS — N6489 Other specified disorders of breast: Secondary | ICD-10-CM | POA: Diagnosis not present

## 2024-03-10 DIAGNOSIS — R928 Other abnormal and inconclusive findings on diagnostic imaging of breast: Secondary | ICD-10-CM

## 2024-03-30 ENCOUNTER — Other Ambulatory Visit: Payer: Self-pay | Admitting: Family Medicine

## 2024-03-30 ENCOUNTER — Ambulatory Visit: Admitting: Surgery

## 2024-03-30 ENCOUNTER — Encounter: Payer: Self-pay | Admitting: Surgery

## 2024-03-30 VITALS — BP 117/71 | HR 74 | Temp 98.7°F | Ht 65.0 in | Wt 194.6 lb

## 2024-03-30 DIAGNOSIS — K219 Gastro-esophageal reflux disease without esophagitis: Secondary | ICD-10-CM

## 2024-03-30 DIAGNOSIS — K449 Diaphragmatic hernia without obstruction or gangrene: Secondary | ICD-10-CM | POA: Diagnosis not present

## 2024-03-30 DIAGNOSIS — R1013 Epigastric pain: Secondary | ICD-10-CM

## 2024-03-30 DIAGNOSIS — R109 Unspecified abdominal pain: Secondary | ICD-10-CM

## 2024-03-30 NOTE — Progress Notes (Signed)
 Patient ID: Tanya Meza, female   DOB: January 16, 1981, 43 y.o.   MRN: [id number]  HPI Tanya Meza is a 43 y.o. female seen in consultation at the request of Dr. Dineen. He recently had a visit to the emergency room complaining of acute onset of epigastric pain.  The pain is moderate to severe.  Located in the epigastric area.  Sharp no specific alleviating or aggravating factors.  She did have nausea.  She takes PPI daily and does have a history of reflux. He did have a GI cocktail and PPI in emergency room as well as narcotics with the relief of symptoms.  She did have a CT scan that I personally reviewed showing a small hiatal hernia and also cholelithiasis.  Did have an elevated white count at that time of 14.  Normal CMP.  At that time cardiac workup was negative including negative troponins.  She is able to perform more than 4 METS of activity without any shortness of breath or chest pain. Does have a history of C-sections x 3. He did have a EGD 2 years ago by Dr. Onita showing small hiatal hernia  HPI  Past Medical History:  Diagnosis Date   Complication of anesthesia    GERD (gastroesophageal reflux disease)    with preg, on zantac   Nephrolithiasis    no surgery required   PCOS (polycystic ovarian syndrome)    Tachycardia    RVOF    Past Surgical History:  Procedure Laterality Date   CESAREAN SECTION  2005, 2008   x3   CESAREAN SECTION WITH BILATERAL TUBAL LIGATION     COLONOSCOPY N/A 12/22/2021   Procedure: COLONOSCOPY;  Surgeon: Onita Elspeth Sharper, DO;  Location: [facility] ENDOSCOPY;  Service: Gastroenterology;  Laterality: N/A;   DILITATION & CURRETTAGE/HYSTROSCOPY WITH HYDROTHERMAL ABLATION N/A 05/06/2019   Procedure: DILATATION & CURETTAGE/HYSTEROSCOPY WITH HYDROTHERMAL ABLATION;  Surgeon: Fredirick Glenys RAMAN, MD;  Location: [facility];  Service: Gynecology;  Laterality: N/A;   ESOPHAGOGASTRODUODENOSCOPY N/A 10/06/2021   Procedure: ESOPHAGOGASTRODUODENOSCOPY  (EGD);  Surgeon: Onita Elspeth Sharper, DO;  Location: [facility] ENDOSCOPY;  Service: Gastroenterology;  Laterality: N/A;   ESOPHAGOGASTRODUODENOSCOPY N/A 12/22/2021   Procedure: ESOPHAGOGASTRODUODENOSCOPY (EGD);  Surgeon: Onita Elspeth Sharper, DO;  Location: [facility] ENDOSCOPY;  Service: Gastroenterology;  Laterality: N/A;   WISDOM TOOTH EXTRACTION      Family History  Problem Relation Age of Onset   Thyroid disease Mother    Hyperlipidemia Mother    Hypothyroidism Mother    Tongue cancer Mother    Diabetes Father    Hyperlipidemia Father    Hypertension Brother    Arthritis/Rheumatoid Brother    Hyperlipidemia Brother    Breast cancer Maternal Aunt    Heart attack Maternal Grandfather    Alcohol abuse Paternal Grandfather    Heart attack Paternal Grandfather    Anesthesia problems Neg Hx    Colon cancer Neg Hx     Social History Social History   Tobacco Use   Smoking status: Never   Smokeless tobacco: Never  Vaping Use   Vaping status: Never Used  Substance Use Topics   Alcohol use: Not Currently    Comment: social   Drug use: No    Allergies  Allergen Reactions   Latex Swelling    Swelling- around catheter    Current Outpatient Medications  Medication Sig Dispense Refill   cyclobenzaprine  (FLEXERIL ) 10 MG tablet Take 1 tablet (10 mg total) by mouth 3 (three) times daily as needed. 15  tablet 0   meloxicam  (MOBIC ) 15 MG tablet Take 1 tablet (15 mg total) by mouth daily. 30 tablet 0   omeprazole  (PRILOSEC) 40 MG capsule TAKE 1 CAPSULE BY MOUTH EVERY DAY 90 capsule 3   ondansetron  (ZOFRAN -ODT) 4 MG disintegrating tablet Take 1 tablet (4 mg total) by mouth every 6 (six) hours as needed for nausea or vomiting. 20 tablet 0   oxyCODONE  (ROXICODONE ) 5 MG immediate release tablet Take 1 tablet (5 mg total) by mouth every 8 (eight) hours as needed. 15 tablet 0   Polyethylene Glycol 3350  (MIRALAX  PO) Take by mouth. 1 cap full 1-2 times a week     sucralfate  (CARAFATE ) 1 GM/10ML  suspension Take 10 mLs (1 g total) by mouth 4 (four) times daily. 420 mL 1   No current facility-administered medications for this visit.     Review of Systems Full ROS  was asked and was negative except for the information on the HPI  Physical Exam Blood pressure 117/71, pulse 74, temperature 98.7 F (37.1 C), temperature source Oral, height 5' 5 (1.651 m), weight 194 lb 9.6 oz (88.3 kg), last menstrual period 02/05/2024, SpO2 95%. CONSTITUTIONAL: NAD. EYES: Pupils are equal, round, Sclera are non-icteric. EARS, NOSE, MOUTH AND THROAT: The oropharynx is clear. The oral mucosa is pink and moist. Hearing is intact to voice. LYMPH NODES:  Lymph nodes in the neck are normal. RESPIRATORY:  Lungs are clear. There is normal respiratory effort, with equal breath sounds bilaterally, and without pathologic use of accessory muscles. CARDIOVASCULAR: Heart is regular without murmurs, gallops, or rubs. GI: The abdomen is  soft, mildly tender to palpation in the epigastric area.  No peritonitis no Beverley there are no palpable masses. There is no hepatosplenomegaly. There are normal bowel sounds. GU: Rectal deferred.   MUSCULOSKELETAL: Normal muscle strength and tone. No cyanosis or edema.   SKIN: Turgor is good and there are no pathologic skin lesions or ulcers. NEUROLOGIC: Motor and sensation is grossly normal. Cranial nerves are grossly intact. PSYCH:  Oriented to person, place and time. Affect is normal.  Data Reviewed I have personally reviewed the patient's imaging, laboratory findings and medical records.    Assessment/Plan 43 year old female with epigastric pain consistent with potential biliary colic.  Differentials also include hiatal hernia and atypical reflux.  Cussed with her in detail.  I would like to obtain a right upper quadrant ultrasound to delineate the anatomy and assess the gallbladder.  If in fact the confirmed gallstones I do think that probably this is the culprit and she may  need cholecystectomy.  Regarding her hiatal hernia I think that it is small and may not account for all symptoms.  Discussed with her in detail about potentially what this will entail.  We do think that need more clarity regarding her diagnosis before moving forward with surgery.  I will be happy to see her back. Like to interrogate also her hiatal hernia and we will obtain a barium swallow. I personally spent a total of 55 minutes in the care of the patient today including performing a medically appropriate exam/evaluation, counseling and educating, placing orders, referring and communicating with other health care professionals, documenting clinical information in the EHR, independently interpreting and reviewing images studies and coordinating care.  A copy of this report was sent to the referring provider  Laneta Luna, MD FACS General Surgeon 03/30/2024, 3:30 PM

## 2024-03-30 NOTE — Patient Instructions (Addendum)
 Your Barium Swallow is scheduled for 04/03/2024 at 11:30 am [facility]. Nothing to eat or drink 3 hours prior.    Your Ultrasound is scheduled for 04/03/2024 at 9:30 am (arrive by 9 am) [facility].    Hiatal Hernia   A hiatal hernia occurs when part of the stomach slides above the muscle that separates the abdomen from the chest (diaphragm). A person can be born with a hiatal hernia (congenital), or it may develop over time. In almost all cases of hiatal hernia, only the top part of the stomach pushes through the diaphragm. Many people have a hiatal hernia with no symptoms. The larger the hernia, the more likely it is that you will have symptoms. In some cases, a hiatal hernia allows stomach acid to flow back into the tube that carries food from your mouth to your stomach (esophagus). This may cause heartburn symptoms. The development of heartburn symptoms may mean that you have a condition called gastroesophageal reflux disease (GERD). What are the causes? This condition is caused by a weakness in the opening (hiatus) where the esophagus passes through the diaphragm to attach to the upper part of the stomach. A person may be born with a weakness in the hiatus, or a weakness can develop over time. What increases the risk? This condition is more likely to develop in: Older people. Age is a major risk factor for a hiatal hernia, especially if you are over the age of 24. Pregnant women. People who are overweight. People who have frequent constipation. What are the signs or symptoms? Symptoms of this condition usually develop in the form of GERD symptoms. Symptoms include: Heartburn. Upset stomach (indigestion). Trouble swallowing. Coughing or wheezing. Wheezing is making high-pitched whistling sounds when you breathe. Sore throat. Chest pain. Nausea and vomiting. How is this diagnosed? This condition may be diagnosed during testing for GERD. Tests that may be done  include: X-rays of your stomach or chest. An upper gastrointestinal (GI) series. This is an X-ray exam of your GI tract that is taken after you swallow a chalky liquid that shows up clearly on the X-ray. Endoscopy. This is a procedure to look into your stomach using a thin, flexible tube that has a tiny camera and light on the end of it. How is this treated? This condition may be treated by: Dietary and lifestyle changes to help reduce GERD symptoms. Medicines. These may include: Over-the-counter antacids. Medicines that make your stomach empty more quickly. Medicines that block the production of stomach acid (H2 blockers). Stronger medicines to reduce stomach acid (proton pump inhibitors). Surgery to repair the hernia, if other treatments are not helping. If you have no symptoms, you may not need treatment. Follow these instructions at home: Lifestyle and activity Do not use any products that contain nicotine or tobacco. These products include cigarettes, chewing tobacco, and vaping devices, such as e-cigarettes. If you need help quitting, ask your health care provider. Try to achieve and maintain a healthy body weight. Avoid putting pressure on your abdomen. Anything that puts pressure on your abdomen increases the amount of acid that may be pushed up into your esophagus. Avoid bending over, especially after eating. Raise the head of your bed by putting blocks under the legs. This keeps your head and esophagus higher than your stomach. Do not wear tight clothing around your chest or stomach. Try not to strain when having a bowel movement, when urinating, or when lifting heavy objects. Eating and drinking Avoid foods  that can worsen GERD symptoms. These may include: Fatty foods, like fried foods. Citrus fruits, like oranges or lemon. Other foods and drinks that contain acid, like orange juice or tomatoes. Spicy food. Chocolate. Eat frequent small meals instead of three large meals a  day. This helps prevent your stomach from getting too full. Eat slowly. Do not lie down right after eating. Do not eat 1-2 hours before bed. Do not drink beverages with caffeine. These include cola, coffee, cocoa, and tea. Do not drink alcohol. General instructions Take over-the-counter and prescription medicines only as told by your health care provider. Keep all follow-up visits. Your health care provider will want to check that any new prescribed medicines are helping your symptoms. Contact a health care provider if: Your symptoms are not controlled with medicines or lifestyle changes. You are having trouble swallowing. You have coughing or wheezing that will not go away. Your pain is getting worse. Your pain spreads to your arms, neck, jaw, teeth, or back. You feel nauseous or you vomit. Get help right away if: You have shortness of breath. You vomit blood. You have bright red blood in your stools. You have black, tarry stools. These symptoms may be an emergency. Get help right away. Call 911. Do not wait to see if the symptoms will go away. Do not drive yourself to the hospital. Summary A hiatal hernia occurs when part of the stomach slides above the muscle that separates the abdomen from the chest. A person may be born with a weakness in the hiatus, or a weakness can develop over time. Symptoms of a hiatal hernia may include heartburn, trouble swallowing, or sore throat. Management of a hiatal hernia includes eating frequent small meals instead of three large meals a day. Get help right away if you vomit blood, have bright red blood in your stools, or have black, tarry stools. This information is not intended to replace advice given to you by your health care provider. Make sure you discuss any questions you have with your health care provider. Document Revised: 08/01/2021 Document Reviewed: 08/01/2021 Elsevier Patient Education  2024 [employer].

## 2024-04-03 ENCOUNTER — Ambulatory Visit
Admission: RE | Admit: 2024-04-03 | Discharge: 2024-04-03 | Disposition: A | Discharge status: Home / Self Care | Source: Ambulatory Visit | Attending: Surgery | Admitting: Surgery

## 2024-04-03 ENCOUNTER — Ambulatory Visit
Admission: RE | Admit: 2024-04-03 | Discharge: 2024-04-03 | Disposition: A | Source: Ambulatory Visit | Attending: Surgery

## 2024-04-03 DIAGNOSIS — R109 Unspecified abdominal pain: Secondary | ICD-10-CM | POA: Diagnosis not present

## 2024-04-03 DIAGNOSIS — K8020 Calculus of gallbladder without cholecystitis without obstruction: Secondary | ICD-10-CM | POA: Diagnosis not present

## 2024-04-03 DIAGNOSIS — K219 Gastro-esophageal reflux disease without esophagitis: Secondary | ICD-10-CM | POA: Diagnosis not present

## 2024-04-03 DIAGNOSIS — R1011 Right upper quadrant pain: Secondary | ICD-10-CM | POA: Diagnosis not present

## 2024-04-03 DIAGNOSIS — K449 Diaphragmatic hernia without obstruction or gangrene: Secondary | ICD-10-CM | POA: Diagnosis not present

## 2024-04-03 DIAGNOSIS — R079 Chest pain, unspecified: Secondary | ICD-10-CM | POA: Diagnosis not present

## 2024-04-08 ENCOUNTER — Ambulatory Visit (INDEPENDENT_AMBULATORY_CARE_PROVIDER_SITE_OTHER): Admitting: Surgery

## 2024-04-08 ENCOUNTER — Encounter: Payer: Self-pay | Admitting: Surgery

## 2024-04-08 VITALS — BP 126/85 | HR 67 | Temp 98.2°F | Ht 65.0 in | Wt 194.0 lb

## 2024-04-08 DIAGNOSIS — K8020 Calculus of gallbladder without cholecystitis without obstruction: Secondary | ICD-10-CM

## 2024-04-08 DIAGNOSIS — K449 Diaphragmatic hernia without obstruction or gangrene: Secondary | ICD-10-CM

## 2024-04-08 NOTE — Patient Instructions (Signed)
 You have requested to have your gallbladder removed. This will be done at [facility] with Dr. Everlene Farrier.  If you are on any injectable weight loss medication, you will need to stop taking your GLP-1 injectable (weight loss) medications 8 days before your surgery to avoid any complications with anesthesia.   You will most likely be out of work 1-2 weeks for this surgery.  If you have FMLA or disability paperwork that needs filled out you may drop this off at our office or this can be faxed to [phone].  You will return after your post-op appointment with a lifting restriction for approximately 4 more weeks.  You will be able to eat anything you would like to following surgery. But, start by eating a bland diet and advance this as tolerated. The Gallbladder diet is below, please go as closely by this diet as possible prior to surgery to avoid any further attacks.  Please see the (blue)pre-care form that you have been given today. Our surgery scheduler will call you to verify surgery date and to go over information.   If you have any questions, please call our office.  Laparoscopic Cholecystectomy Laparoscopic cholecystectomy is surgery to remove the gallbladder. The gallbladder is located in the upper right part of the abdomen, behind the liver. It is a storage sac for bile, which is produced in the liver. Bile aids in the digestion and absorption of fats. Cholecystectomy is often done for inflammation of the gallbladder (cholecystitis). This condition is usually caused by a buildup of gallstones (cholelithiasis) in the gallbladder. Gallstones can block the flow of bile, and that can result in inflammation and pain. In severe cases, emergency surgery may be required. If emergency surgery is not required, you will have time to prepare for the procedure. Laparoscopic surgery is an alternative to open surgery. Laparoscopic surgery has a shorter recovery time. Your common bile duct may also need  to be examined during the procedure. If stones are found in the common bile duct, they may be removed. LET [facility] CARE PROVIDER KNOW ABOUT: Any allergies you have. All medicines you are taking, including vitamins, herbs, eye drops, creams, and over-the-counter medicines. Previous problems you or members of your family have had with the use of anesthetics. Any blood disorders you have. Previous surgeries you have had.  Any medical conditions you have. RISKS AND COMPLICATIONS Generally, this is a safe procedure. However, problems may occur, including: Infection. Bleeding. Allergic reactions to medicines. Damage to other structures or organs. A stone remaining in the common bile duct. A bile leak from the cyst duct that is clipped when your gallbladder is removed. The need to convert to open surgery, which requires a larger incision in the abdomen. This may be necessary if your surgeon thinks that it is not safe to continue with a laparoscopic procedure. BEFORE THE PROCEDURE Ask your health care provider about: Changing or stopping your regular medicines. This is especially important if you are taking diabetes medicines or blood thinners. Taking medicines such as aspirin and ibuprofen. These medicines can thin your blood. Do not take these medicines before your procedure if your health care provider instructs you not to. Follow instructions from your health care provider about eating or drinking restrictions. Let your health care provider know if you develop a cold or an infection before surgery. Plan to have someone take you home after the procedure. Ask your health care provider how your surgical site will be marked or identified. You may  be given antibiotic medicine to help prevent infection. PROCEDURE To reduce your risk of infection: Your health care team will wash or sanitize their hands. Your skin will be washed with soap. An IV tube may be inserted into one of your  veins. You will be given a medicine to make you fall asleep (general anesthetic). A breathing tube will be placed in your mouth. The surgeon will make several small cuts (incisions) in your abdomen. A thin, lighted tube (laparoscope) that has a tiny camera on the end will be inserted through one of the small incisions. The camera on the laparoscope will send a picture to a TV screen (monitor) in the operating room. This will give the surgeon a good view inside your abdomen. A gas will be pumped into your abdomen. This will expand your abdomen to give the surgeon more room to perform the surgery. Other tools that are needed for the procedure will be inserted through the other incisions. The gallbladder will be removed through one of the incisions. After your gallbladder has been removed, the incisions will be closed with stitches (sutures), staples, or skin glue. Your incisions may be covered with a bandage (dressing). The procedure may vary among health care providers and hospitals. AFTER THE PROCEDURE Your blood pressure, heart rate, breathing rate, and blood oxygen level will be monitored often until the medicines you were given have worn off. You will be given medicines as needed to control your pain.   This information is not intended to replace advice given to you by your health care provider. Make sure you discuss any questions you have with your health care provider.   Document Released: 06/04/2005 Document Revised: 02/23/2015 Document Reviewed: 01/14/2013 Elsevier Interactive Patient Education 2016 [employer] Inc[employer]   Low-Fat Diet for Gallbladder Conditions A low-fat diet can be helpful if you have pancreatitis or a gallbladder condition. With these conditions, your pancreas and gallbladder have trouble digesting fats. A healthy eating plan with less fat will help rest your pancreas and gallbladder and reduce your symptoms. WHAT DO I NEED TO KNOW ABOUT THIS DIET? Eat a low-fat  diet. Reduce your fat intake to less than 20-30% of your total daily calories. This is less than 50-60 g of fat per day. Remember that you need some fat in your diet. Ask your dietician what your daily goal should be. Choose nonfat and low-fat healthy foods. Look for the words "nonfat," "low fat," or "fat free." As a guide, look on the label and choose foods with less than 3 g of fat per serving. Eat only one serving. Avoid alcohol. Do not smoke. If you need help quitting, talk with your health care provider. Eat small frequent meals instead of three large heavy meals. WHAT FOODS CAN I EAT? Grains Include healthy grains and starches such as potatoes, wheat bread, fiber-rich cereal, and brown rice. Choose whole grain options whenever possible. In adults, whole grains should account for 45-65% of your daily calories.  Fruits and Vegetables Eat plenty of fruits and vegetables. Fresh fruits and vegetables add fiber to your diet. Meats and Other Protein Sources Eat lean meat such as chicken and pork. Trim any fat off of meat before cooking it. Eggs, fish, and beans are other sources of protein. In adults, these foods should account for 10-35% of your daily calories. Dairy Choose low-fat milk and dairy options. Dairy includes fat and protein, as well as calcium.  Fats and Oils Limit high-fat foods such as fried foods, sweets, baked  goods, sugary drinks.  Other Creamy sauces and condiments, such as mayonnaise, can add extra fat. Think about whether or not you need to use them, or use smaller amounts or low fat options. WHAT FOODS ARE NOT RECOMMENDED? High fat foods, such as: [employer]. Ice cream. [place] toast. Sweet rolls. Pizza. Cheese bread. Foods covered with batter, butter, creamy sauces, or cheese. Fried foods. Sugary drinks and desserts. Foods that cause gas or bloating   This information is not intended to replace advice given to you by your health care provider. Make sure you  discuss any questions you have with your health care provider.   Document Released: 06/09/2013 Document Reviewed: 06/09/2013 Elsevier Interactive Patient Education [employer]

## 2024-04-08 NOTE — Progress Notes (Signed)
 Outpatient Surgical Follow Up  04/08/2024  Tanya Meza is an 43 y.o. female.   Chief Complaint  Patient presents with   Follow-up    HPI: Tanya Meza is a 43 y.o. female seen in f/u abd pain / biliary colic. SHe recently had a visit to the emergency room complaining of acute onset of epigastric pain She states that she has not had any more attacks,  She did have a CT scan that I personally reviewed showing a small hiatal hernia and also cholelithiasis.  Sxs seems for biliary related   Does have a history of C-sections x 3. SHe did have a EGD 2 years ago by Dr. Onita showing small hiatal hernia  Past Medical History:  Diagnosis Date   Complication of anesthesia    GERD (gastroesophageal reflux disease)    with preg, on zantac   Nephrolithiasis    no surgery required   PCOS (polycystic ovarian syndrome)    Tachycardia    RVOF    Past Surgical History:  Procedure Laterality Date   CESAREAN SECTION  2005, 2008   x3   CESAREAN SECTION WITH BILATERAL TUBAL LIGATION     COLONOSCOPY N/A 12/22/2021   Procedure: COLONOSCOPY;  Surgeon: Onita Elspeth Sharper, DO;  Location: [facility] ENDOSCOPY;  Service: Gastroenterology;  Laterality: N/A;   DILITATION & CURRETTAGE/HYSTROSCOPY WITH HYDROTHERMAL ABLATION N/A 05/06/2019   Procedure: DILATATION & CURETTAGE/HYSTEROSCOPY WITH HYDROTHERMAL ABLATION;  Surgeon: Fredirick Glenys RAMAN, MD;  Location: [facility];  Service: Gynecology;  Laterality: N/A;   ESOPHAGOGASTRODUODENOSCOPY N/A 10/06/2021   Procedure: ESOPHAGOGASTRODUODENOSCOPY (EGD);  Surgeon: Onita Elspeth Sharper, DO;  Location: [facility] ENDOSCOPY;  Service: Gastroenterology;  Laterality: N/A;   ESOPHAGOGASTRODUODENOSCOPY N/A 12/22/2021   Procedure: ESOPHAGOGASTRODUODENOSCOPY (EGD);  Surgeon: Onita Elspeth Sharper, DO;  Location: [facility] ENDOSCOPY;  Service: Gastroenterology;  Laterality: N/A;   WISDOM TOOTH EXTRACTION      Family History  Problem Relation Age of Onset   Thyroid  disease Mother    Hyperlipidemia Mother    Hypothyroidism Mother    Tongue cancer Mother    Diabetes Father    Hyperlipidemia Father    Hypertension Brother    Arthritis/Rheumatoid Brother    Hyperlipidemia Brother    Breast cancer Maternal Aunt    Heart attack Maternal Grandfather    Alcohol abuse Paternal Grandfather    Heart attack Paternal Grandfather    Anesthesia problems Neg Hx    Colon cancer Neg Hx     Social History:  reports that she has never smoked. She has never used smokeless tobacco. She reports that she does not currently use alcohol. She reports that she does not use drugs.  Allergies:  Allergies  Allergen Reactions   Latex Swelling    Swelling- around catheter    Medications reviewed.    ROS Full ROS performed and is otherwise negative other than what is stated in HPI   BP 126/85   Pulse 67   Temp 98.2 F (36.8 C) (Oral)   Ht 5' 5 (1.651 m)   Wt 194 lb (88 kg)   SpO2 96%   BMI 32.28 kg/m   Physical Exam  CONSTITUTIONAL: NAD. EYES: Pupils are equal, round, Sclera are non-icteric. EARS, NOSE, MOUTH AND THROAT: The oropharynx is clear. The oral mucosa is pink and moist. Hearing is intact to voice. LYMPH NODES:  Lymph nodes in the neck are normal. RESPIRATORY:  Lungs are clear. There is normal respiratory effort, with equal breath sounds bilaterally, and without  pathologic use of accessory muscles. CARDIOVASCULAR: Heart is regular without murmurs, gallops, or rubs. GI: The abdomen is  soft, No peritonitis no Beverley there are no palpable masses. There is no hepatosplenomegaly. There are normal bowel sounds. GU: Rectal deferred.   MUSCULOSKELETAL: Normal muscle strength and tone. No cyanosis or edema.   SKIN: Turgor is good and there are no pathologic skin lesions or ulcers. NEUROLOGIC: Motor and sensation is grossly normal. Cranial nerves are grossly intact. PSYCH:  Oriented to person, place and time. Affect is normal.   Assessment/Plan: 43  yo female w symp cholelithiasis, I do recommend cholecystectomy first.  The risks, benefits, complications, treatment options, and expected outcomes were discussed with the patient. The possibilities of bleeding, recurrent infection, finding a normal gallbladder, perforation of viscus organs, damage to surrounding structures, bile leak, abscess formation, needing a drain placed, the need for additional procedures, reaction to medication, pulmonary aspiration,  failure to diagnose a condition, the possible need to convert to an open procedure, and creating a complication requiring transfusion or operation were discussed with the patient. The patient and/or family concurred with the proposed plan, giving informed consent.  Diarrhea also discussed as potential side effects Regarding HH, I would like to see how she responds to cholecystectomy first, for now treat PPI I personally spent a total of 40 minutes in the care of the patient today including performing a medically appropriate exam/evaluation, counseling and educating, placing orders, referring and communicating with other health care professionals, documenting clinical information in the EHR, independently interpreting and reviewing images studies and coordinating care.   Laneta Luna, MD [facility] General Surgeon

## 2024-04-08 NOTE — H&P (View-Only) (Signed)
 Outpatient Surgical Follow Up  04/08/2024  Tanya Meza is an 43 y.o. female.   Chief Complaint  Patient presents with   Follow-up    HPI: Tanya Meza is a 43 y.o. female seen in f/u abd pain / biliary colic. SHe recently had a visit to the emergency room complaining of acute onset of epigastric pain She states that she has not had any more attacks,  She did have a CT scan that I personally reviewed showing a small hiatal hernia and also cholelithiasis.  Sxs seems for biliary related   Does have a history of C-sections x 3. SHe did have a EGD 2 years ago by Dr. Onita showing small hiatal hernia  Past Medical History:  Diagnosis Date   Complication of anesthesia    GERD (gastroesophageal reflux disease)    with preg, on zantac   Nephrolithiasis    no surgery required   PCOS (polycystic ovarian syndrome)    Tachycardia    RVOF    Past Surgical History:  Procedure Laterality Date   CESAREAN SECTION  2005, 2008   x3   CESAREAN SECTION WITH BILATERAL TUBAL LIGATION     COLONOSCOPY N/A 12/22/2021   Procedure: COLONOSCOPY;  Surgeon: Onita Elspeth Sharper, DO;  Location: [facility] ENDOSCOPY;  Service: Gastroenterology;  Laterality: N/A;   DILITATION & CURRETTAGE/HYSTROSCOPY WITH HYDROTHERMAL ABLATION N/A 05/06/2019   Procedure: DILATATION & CURETTAGE/HYSTEROSCOPY WITH HYDROTHERMAL ABLATION;  Surgeon: Fredirick Glenys RAMAN, MD;  Location: [facility];  Service: Gynecology;  Laterality: N/A;   ESOPHAGOGASTRODUODENOSCOPY N/A 10/06/2021   Procedure: ESOPHAGOGASTRODUODENOSCOPY (EGD);  Surgeon: Onita Elspeth Sharper, DO;  Location: [facility] ENDOSCOPY;  Service: Gastroenterology;  Laterality: N/A;   ESOPHAGOGASTRODUODENOSCOPY N/A 12/22/2021   Procedure: ESOPHAGOGASTRODUODENOSCOPY (EGD);  Surgeon: Onita Elspeth Sharper, DO;  Location: [facility] END[facility]COPY;  Service: Gastroenterology;  Laterality: N/A;   WISDOM TOOTH EXTRACTION      Family History  Problem Relation Age of Onset   Thyroid  disease Mother    Hyperlipidemia Mother    Hypothyroidism Mother    Tongue cancer Mother    Diabetes Father    Hyperlipidemia Father    Hypertension Brother    Arthritis/Rheumatoid Brother    Hyperlipidemia Brother    Breast cancer Maternal Aunt    Heart attack Maternal Grandfather    Alcohol abuse Paternal Grandfather    Heart attack Paternal Grandfather    Anesthesia problems Neg Hx    Colon cancer Neg Hx     Social History:  reports that she has never smoked. She has never used smokeless tobacco. She reports that she does not currently use alcohol. She reports that she does not use drugs.  Allergies:  Allergies  Allergen Reactions   Latex Swelling    Swelling- around catheter    Medications reviewed.    ROS Full ROS performed and is otherwise negative other than what is stated in HPI   BP 126/85   Pulse 67   Temp 98.2 F (36.8 C) (Oral)   Ht 5' 5 (1.651 m)   Wt 194 lb (88 kg)   SpO2 96%   BMI 32.28 kg/m   Physical Exam  CONSTITUTIONAL: NAD. EYES: Pupils are equal, round, Sclera are non-icteric. EARS, NOSE, MOUTH AND THROAT: The oropharynx is clear. The oral mucosa is pink and moist. Hearing is intact to voice. LYMPH NODES:  Lymph nodes in the neck are normal. RESPIRATORY:  Lungs are clear. There is normal respiratory effort, with equal breath sounds bilaterally, and without  pathologic use of accessory muscles. CARDIOVASCULAR: Heart is regular without murmurs, gallops, or rubs. GI: The abdomen is  soft, No peritonitis no Beverley there are no palpable masses. There is no hepatosplenomegaly. There are normal bowel sounds. GU: Rectal deferred.   MUSCULOSKELETAL: Normal muscle strength and tone. No cyanosis or edema.   SKIN: Turgor is good and there are no pathologic skin lesions or ulcers. NEUROLOGIC: Motor and sensation is grossly normal. Cranial nerves are grossly intact. PSYCH:  Oriented to person, place and time. Affect is normal.   Assessment/Plan: 43  yo female w symp cholelithiasis, I do recommend cholecystectomy first.  The risks, benefits, complications, treatment options, and expected outcomes were discussed with the patient. The possibilities of bleeding, recurrent infection, finding a normal gallbladder, perforation of viscus organs, damage to surrounding structures, bile leak, abscess formation, needing a drain placed, the need for additional procedures, reaction to medication, pulmonary aspiration,  failure to diagnose a condition, the possible need to convert to an open procedure, and creating a complication requiring transfusion or operation were discussed with the patient. The patient and/or family concurred with the proposed plan, giving informed consent.  Diarrhea also discussed as potential side effects Regarding HH, I would like to see how she responds to cholecystectomy first, for now treat PPI I personally spent a total of 40 minutes in the care of the patient today including performing a medically appropriate exam/evaluation, counseling and educating, placing orders, referring and communicating with other health care professionals, documenting clinical information in the EHR, independently interpreting and reviewing images studies and coordinating care.   Laneta Luna, MD [facility] General Surgeon

## 2024-04-09 ENCOUNTER — Telehealth: Payer: Self-pay | Admitting: Surgery

## 2024-04-09 NOTE — Telephone Encounter (Signed)
 Patient has been advised of Pre-Admission date/time, and Surgery date at [facility].  Surgery Date: 04/16/24 Preadmission Testing Date: 04/14/24 (phone 8a-1p)  Patient informed of the scheduling process and surgery information given at time of office visit.  Patient has been made aware to call [phone], between 1-3:00pm the day before surgery, to find out what time to arrive for surgery.

## 2024-04-14 ENCOUNTER — Other Ambulatory Visit: Payer: Self-pay

## 2024-04-14 ENCOUNTER — Encounter
Admission: RE | Admit: 2024-04-14 | Discharge: 2024-04-14 | Disposition: A | Discharge status: Home / Self Care | Source: Ambulatory Visit | Attending: Surgery | Admitting: Surgery

## 2024-04-14 VITALS — Ht 65.0 in | Wt 192.0 lb

## 2024-04-14 DIAGNOSIS — Z01818 Encounter for other preprocedural examination: Secondary | ICD-10-CM

## 2024-04-14 HISTORY — DX: Other ventricular tachycardia: I47.29

## 2024-04-14 NOTE — Patient Instructions (Addendum)
 Your procedure is scheduled on: Thursday 04/16/24 Report to the Registration Desk on the 1st floor of the [facility]. To find out your arrival time, please call [phone] (they will call you) between 1PM - 3PM on: Wednesday 04/15/24 If your arrival time is 6:00 am, do not arrive before that time as the [facility] entrance doors do not open until 6:00 am.  REMEMBER: Instructions that are not followed completely may result in serious medical risk, up to and including death; or upon the discretion of your surgeon and anesthesiologist your surgery may need to be rescheduled.  Do not eat food after midnight the night before surgery.  No gum chewing or hard candies.  You may however, drink CLEAR liquids up to 2 hours before you are scheduled to arrive for your surgery. Do not drink anything within 2 hours of your scheduled arrival time.  Clear liquids include: - water  - apple juice without pulp - gatorade (not RED colors) - black coffee or tea (Do NOT add milk or creamers to the coffee or tea) Do NOT drink anything that is not on this list.  One week prior to surgery: Stop Anti-inflammatories (NSAIDS) such as Advil , Aleve, Ibuprofen , Motrin , Naproxen, Naprosyn and Aspirin based products such as Excedrin, Goody's Powder, BC Powder.  You may however, continue to take Tylenol  if needed for pain up until the day of surgery  Stop ANY OVER THE COUNTER supplements and vitamins until after surgery.  Continue taking all of your other prescription medications up until the day of surgery.  ON THE DAY OF SURGERY ONLY TAKE THESE MEDICATIONS WITH SIPS OF WATER:  omeprazole  (PRILOSEC) 40 MG capsule   No Alcohol for 24 hours before or after surgery.  No Smoking including e-cigarettes for 24 hours before surgery.  No chewable tobacco products for at least 6 hours before surgery.  No nicotine patches on the day of surgery.  Do not use any recreational drugs for at least a week (preferably  2 weeks) before your surgery.  Please be advised that the combination of cocaine and anesthesia may have negative outcomes, up to and including death. If you test positive for cocaine, your surgery will be cancelled.  On the morning of surgery brush your teeth with toothpaste and water, you may rinse your mouth with mouthwash if you wish. Do not swallow any toothpaste or mouthwash.  Use CHG Soap or wipes as directed on instruction sheet.  Do not wear lotions, powders, or perfumes.   Do not shave body hair from the neck down 48 hours before surgery.  Wear comfortable clothing (specific to your surgery type) to the hospital.  Do not wear jewelry, make-up, hairpins, clips or nail polish.  For welded (permanent) jewelry: bracelets, anklets, waist bands, etc.  Please have this removed prior to surgery.  If it is not removed, there is a chance that hospital personnel will need to cut it off on the day of surgery.  Contact lenses, hearing aids and dentures may not be worn into surgery.  Do not bring valuables to the hospital. [facility] is not responsible for any missing/lost belongings or valuables.   Notify your doctor if there is any change in your medical condition (cold, fever, infection).  After surgery, you can help prevent lung complications by doing breathing exercises.  Take deep breaths and cough every 1-2 hours. Your doctor may order a device called an Incentive Spirometer to help you take deep breaths.  When coughing or sneezing,  hold a pillow firmly against your incision with both hands. This is called "splinting." Doing this helps protect your incision. It also decreases belly discomfort.  If you are being discharged the day of surgery, you will not be allowed to drive home. You will need a responsible individual to drive you home and stay with you for 24 hours after surgery.   Please call the Pre-admissions Testing Dept. at [phone] if you have any questions about  these instructions.  Surgery Visitation Policy:  Patients having surgery or a procedure may have two visitors.  Children under the age of 62 must have an adult with them who is not the patient.   Merchandiser, Retail to address health-related social needs:  [email or web address]                                                                                                             Preparing for Surgery with CHLORHEXIDINE GLUCONATE (CHG) Soap  Chlorhexidine Gluconate (CHG) Soap  o An antiseptic cleaner that kills germs and bonds with the skin to continue killing germs even after washing  o Used for showering the night before surgery and morning of surgery  Before surgery, you can play an important role by reducing the number of germs on your skin.  CHG (Chlorhexidine gluconate) soap is an antiseptic cleanser which kills germs and bonds with the skin to continue killing germs even after washing.  Please do not use if you have an allergy to CHG or antibacterial soaps. If your skin becomes reddened/irritated stop using the CHG.  1. Shower the NIGHT BEFORE SURGERY with CHG soap.  2. If you choose to wash your hair, wash your hair first as usual with your normal shampoo.  3. After shampooing, rinse your hair and body thoroughly to remove the shampoo.  4. Use CHG as you would any other liquid soap. You can apply CHG directly to the skin and wash gently with a clean washcloth.  5. Apply the CHG soap to your body only from the neck down. Do not use on open wounds or open sores. Avoid contact with your eyes, ears, mouth, and genitals (private parts). Wash face and genitals (private parts) with your normal soap.  6. Wash thoroughly, paying special attention to the area where your surgery will be performed.  7. Thoroughly rinse your body with warm water.  8. Do not shower/wash with your normal soap after using and rinsing off the CHG soap.  9. Do not use lotions,  oils, etc., after showering with CHG.  10. Pat yourself dry with a clean towel.  11. Wear clean pajamas to bed the night before surgery.  12. Place clean sheets on your bed the night of your shower and do not sleep with pets.  13. Do not apply any deodorants/lotions/powders.  14. Please wear clean clothes to the hospital.  15. Remember to brush your teeth with your regular toothpaste.

## 2024-04-16 ENCOUNTER — Ambulatory Visit: Payer: Self-pay | Admitting: Urgent Care

## 2024-04-16 ENCOUNTER — Ambulatory Visit
Admission: RE | Admit: 2024-04-16 | Discharge: 2024-04-16 | Disposition: A | Discharge status: Home / Self Care | Attending: Surgery | Admitting: Surgery

## 2024-04-16 ENCOUNTER — Encounter: Payer: Self-pay | Admitting: Surgery

## 2024-04-16 ENCOUNTER — Ambulatory Visit: Admitting: Anesthesiology

## 2024-04-16 ENCOUNTER — Encounter: Admission: RE | Disposition: A | Payer: Self-pay | Source: Home / Self Care | Attending: Surgery

## 2024-04-16 ENCOUNTER — Other Ambulatory Visit: Payer: Self-pay

## 2024-04-16 DIAGNOSIS — K449 Diaphragmatic hernia without obstruction or gangrene: Secondary | ICD-10-CM | POA: Insufficient documentation

## 2024-04-16 DIAGNOSIS — K8060 Calculus of gallbladder and bile duct with cholecystitis, unspecified, without obstruction: Secondary | ICD-10-CM | POA: Insufficient documentation

## 2024-04-16 DIAGNOSIS — F419 Anxiety disorder, unspecified: Secondary | ICD-10-CM | POA: Diagnosis not present

## 2024-04-16 DIAGNOSIS — Z01818 Encounter for other preprocedural examination: Secondary | ICD-10-CM

## 2024-04-16 DIAGNOSIS — Z98891 History of uterine scar from previous surgery: Secondary | ICD-10-CM | POA: Diagnosis not present

## 2024-04-16 DIAGNOSIS — K219 Gastro-esophageal reflux disease without esophagitis: Secondary | ICD-10-CM | POA: Insufficient documentation

## 2024-04-16 DIAGNOSIS — K811 Chronic cholecystitis: Secondary | ICD-10-CM

## 2024-04-16 DIAGNOSIS — E66811 Obesity, class 1: Secondary | ICD-10-CM | POA: Diagnosis not present

## 2024-04-16 DIAGNOSIS — K8044 Calculus of bile duct with chronic cholecystitis without obstruction: Secondary | ICD-10-CM | POA: Insufficient documentation

## 2024-04-16 DIAGNOSIS — K8020 Calculus of gallbladder without cholecystitis without obstruction: Secondary | ICD-10-CM

## 2024-04-16 DIAGNOSIS — Z79899 Other long term (current) drug therapy: Secondary | ICD-10-CM | POA: Insufficient documentation

## 2024-04-16 DIAGNOSIS — K8010 Calculus of gallbladder with chronic cholecystitis without obstruction: Secondary | ICD-10-CM | POA: Diagnosis not present

## 2024-04-16 DIAGNOSIS — K8050 Calculus of bile duct without cholangitis or cholecystitis without obstruction: Secondary | ICD-10-CM | POA: Diagnosis not present

## 2024-04-16 LAB — POCT PREGNANCY, URINE: Preg Test, Ur: NEGATIVE

## 2024-04-16 SURGERY — CHOLECYSTECTOMY, ROBOT-ASSISTED, LAPAROSCOPIC
Anesthesia: General

## 2024-04-16 MED ORDER — OXYCODONE HCL 5 MG PO TABS
5.0000 mg | ORAL_TABLET | Freq: Once | ORAL | Status: AC | PRN
  Administered 2024-04-16: 5 mg via ORAL

## 2024-04-16 MED ORDER — DEXAMETHASONE SOD PHOSPHATE PF 10 MG/ML IJ SOLN
INTRAMUSCULAR | Status: DC | PRN
  Administered 2024-04-16: 10 mg via INTRAVENOUS

## 2024-04-16 MED ORDER — ACETAMINOPHEN 500 MG PO TABS
ORAL_TABLET | ORAL | Status: AC
Start: 2024-04-16 — End: 2024-04-16
  Filled 2024-04-16: qty 2

## 2024-04-16 MED ORDER — PROPOFOL 10 MG/ML IV BOLUS
INTRAVENOUS | Status: DC | PRN
Start: 2024-04-16 — End: 2024-04-16
  Administered 2024-04-16: 200 mg via INTRAVENOUS

## 2024-04-16 MED ORDER — ROCURONIUM BROMIDE 10 MG/ML (PF) SYRINGE
PREFILLED_SYRINGE | INTRAVENOUS | Status: AC
Start: 2024-04-16 — End: 2024-04-16
  Filled 2024-04-16: qty 10

## 2024-04-16 MED ORDER — CHLORHEXIDINE GLUCONATE CLOTH 2 % EX PADS
6.0000 | MEDICATED_PAD | Freq: Once | CUTANEOUS | Status: AC
  Administered 2024-04-16: 6 via TOPICAL

## 2024-04-16 MED ORDER — CHLORHEXIDINE GLUCONATE CLOTH 2 % EX PADS: 6.0000 | MEDICATED_PAD | Freq: Once | CUTANEOUS | Status: DC

## 2024-04-16 MED ORDER — HYDROMORPHONE HCL 1 MG/ML IJ SOLN
INTRAMUSCULAR | Status: DC | PRN
  Administered 2024-04-16 (×2): .5 mg via INTRAVENOUS

## 2024-04-16 MED ORDER — LACTATED RINGERS IV SOLN: INTRAVENOUS | Status: DC

## 2024-04-16 MED ORDER — CELECOXIB 200 MG PO CAPS
200.0000 mg | ORAL_CAPSULE | ORAL | Status: AC
  Administered 2024-04-16: 200 mg via ORAL

## 2024-04-16 MED ORDER — MIDAZOLAM HCL 2 MG/2ML IJ SOLN
INTRAMUSCULAR | Status: AC
  Filled 2024-04-16: qty 2

## 2024-04-16 MED ORDER — ACETAMINOPHEN 500 MG PO TABS
1000.0000 mg | ORAL_TABLET | ORAL | Status: AC
  Administered 2024-04-16: 1000 mg via ORAL

## 2024-04-16 MED ORDER — ACETAMINOPHEN 10 MG/ML IV SOLN: 1000.0000 mg | Freq: Once | INTRAVENOUS | Status: DC | PRN

## 2024-04-16 MED ORDER — INDOCYANINE GREEN 25 MG IV SOLR
INTRAVENOUS | Status: AC
  Filled 2024-04-16: qty 10

## 2024-04-16 MED ORDER — FENTANYL CITRATE (PF) 100 MCG/2ML IJ SOLN
INTRAMUSCULAR | Status: AC
  Filled 2024-04-16: qty 2

## 2024-04-16 MED ORDER — EPHEDRINE 5 MG/ML INJ
INTRAVENOUS | Status: AC
Start: 2024-04-16 — End: 2024-04-16
  Filled 2024-04-16: qty 5

## 2024-04-16 MED ORDER — CEFAZOLIN SODIUM-DEXTROSE 2-4 GM/100ML-% IV SOLN
INTRAVENOUS | Status: AC
Start: 2024-04-16 — End: 2024-04-16
  Filled 2024-04-16: qty 100

## 2024-04-16 MED ORDER — INDOCYANINE GREEN 25 MG IV SOLR: 1.2500 mg | Freq: Once | INTRAVENOUS | Status: DC

## 2024-04-16 MED ORDER — ORAL CARE MOUTH RINSE: 15.0000 mL | Freq: Once | OROMUCOSAL | Status: AC

## 2024-04-16 MED ORDER — ROCURONIUM BROMIDE 100 MG/10ML IV SOLN
INTRAVENOUS | Status: DC | PRN
  Administered 2024-04-16: 50 mg via INTRAVENOUS

## 2024-04-16 MED ORDER — CELECOXIB 200 MG PO CAPS
ORAL_CAPSULE | ORAL | Status: AC
Start: 2024-04-16 — End: 2024-04-16
  Filled 2024-04-16: qty 1

## 2024-04-16 MED ORDER — MIDAZOLAM HCL (PF) 2 MG/2ML IJ SOLN
INTRAMUSCULAR | Status: DC | PRN
  Administered 2024-04-16: 2 mg via INTRAVENOUS

## 2024-04-16 MED ORDER — CEFAZOLIN SODIUM-DEXTROSE 2-4 GM/100ML-% IV SOLN
2.0000 g | INTRAVENOUS | Status: AC
  Administered 2024-04-16: 2 g via INTRAVENOUS

## 2024-04-16 MED ORDER — FENTANYL CITRATE (PF) 100 MCG/2ML IJ SOLN
INTRAMUSCULAR | Status: DC | PRN
  Administered 2024-04-16 (×2): 50 ug via INTRAVENOUS

## 2024-04-16 MED ORDER — HYDROMORPHONE HCL 1 MG/ML IJ SOLN
INTRAMUSCULAR | Status: AC
  Filled 2024-04-16: qty 1

## 2024-04-16 MED ORDER — GABAPENTIN 300 MG PO CAPS
ORAL_CAPSULE | ORAL | Status: AC
Start: 2024-04-16 — End: 2024-04-16
  Filled 2024-04-16: qty 1

## 2024-04-16 MED ORDER — DROPERIDOL 2.5 MG/ML IJ SOLN: 0.6250 mg | Freq: Once | INTRAMUSCULAR | Status: DC | PRN

## 2024-04-16 MED ORDER — PROPOFOL 10 MG/ML IV BOLUS
INTRAVENOUS | Status: AC
Start: 2024-04-16 — End: 2024-04-16
  Filled 2024-04-16: qty 20

## 2024-04-16 MED ORDER — LIDOCAINE HCL (CARDIAC) PF 100 MG/5ML IV SOSY
PREFILLED_SYRINGE | INTRAVENOUS | Status: DC | PRN
  Administered 2024-04-16: 60 mg via INTRAVENOUS
  Administered 2024-04-16: 40 mg via INTRAVENOUS

## 2024-04-16 MED ORDER — BUPIVACAINE-EPINEPHRINE (PF) 0.25% -1:200000 IJ SOLN
INTRAMUSCULAR | Status: DC | PRN
  Administered 2024-04-16: 30 mL via PERINEURAL

## 2024-04-16 MED ORDER — CHLORHEXIDINE GLUCONATE 0.12 % MT SOLN
15.0000 mL | Freq: Once | OROMUCOSAL | Status: AC
  Administered 2024-04-16: 15 mL via OROMUCOSAL

## 2024-04-16 MED ORDER — KETOROLAC TROMETHAMINE 30 MG/ML IJ SOLN
INTRAMUSCULAR | Status: AC
Start: 2024-04-16 — End: 2024-04-16
  Filled 2024-04-16: qty 1

## 2024-04-16 MED ORDER — GABAPENTIN 300 MG PO CAPS
300.0000 mg | ORAL_CAPSULE | ORAL | Status: AC
  Administered 2024-04-16: 300 mg via ORAL

## 2024-04-16 MED ORDER — PROPOFOL 500 MG/50ML IV EMUL
INTRAVENOUS | Status: DC | PRN
  Administered 2024-04-16: 50 ug/kg/min via INTRAVENOUS

## 2024-04-16 MED ORDER — SUGAMMADEX SODIUM 200 MG/2ML IV SOLN
INTRAVENOUS | Status: DC | PRN
  Administered 2024-04-16: 200 mg via INTRAVENOUS

## 2024-04-16 MED ORDER — BUPIVACAINE-EPINEPHRINE (PF) 0.25% -1:200000 IJ SOLN
INTRAMUSCULAR | Status: AC
Start: 2024-04-16 — End: 2024-04-16
  Filled 2024-04-16: qty 30

## 2024-04-16 MED ORDER — OXYCODONE HCL 5 MG/5ML PO SOLN: 5.0000 mg | Freq: Once | ORAL | Status: AC | PRN

## 2024-04-16 MED ORDER — 0.9 % SODIUM CHLORIDE (POUR BTL) OPTIME
TOPICAL | Status: DC | PRN
  Administered 2024-04-16: 500 mL

## 2024-04-16 MED ORDER — ONDANSETRON HCL 4 MG/2ML IJ SOLN
INTRAMUSCULAR | Status: DC | PRN
  Administered 2024-04-16: 4 mg via INTRAVENOUS

## 2024-04-16 MED ORDER — ONDANSETRON HCL 4 MG/2ML IJ SOLN
INTRAMUSCULAR | Status: AC
  Filled 2024-04-16: qty 2

## 2024-04-16 MED ORDER — FENTANYL CITRATE (PF) 100 MCG/2ML IJ SOLN
25.0000 ug | INTRAMUSCULAR | Status: DC | PRN
  Administered 2024-04-16 (×3): 25 ug via INTRAVENOUS

## 2024-04-16 MED ORDER — LIDOCAINE HCL (PF) 2 % IJ SOLN
INTRAMUSCULAR | Status: AC
  Filled 2024-04-16: qty 5

## 2024-04-16 MED ORDER — OXYCODONE HCL 5 MG PO TABS
ORAL_TABLET | ORAL | Status: AC
  Filled 2024-04-16: qty 1

## 2024-04-16 MED ORDER — PROPOFOL 1000 MG/100ML IV EMUL
INTRAVENOUS | Status: AC
Start: 2024-04-16 — End: 2024-04-16
  Filled 2024-04-16: qty 100

## 2024-04-16 MED ORDER — KETOROLAC TROMETHAMINE 30 MG/ML IJ SOLN
INTRAMUSCULAR | Status: DC | PRN
  Administered 2024-04-16: 15 mg via INTRAVENOUS

## 2024-04-16 MED ORDER — CHLORHEXIDINE GLUCONATE 0.12 % MT SOLN
OROMUCOSAL | Status: AC
  Filled 2024-04-16: qty 15

## 2024-04-16 MED ORDER — EPHEDRINE SULFATE-NACL 50-0.9 MG/10ML-% IV SOSY
PREFILLED_SYRINGE | INTRAVENOUS | Status: DC | PRN
  Administered 2024-04-16: 10 mg via INTRAVENOUS

## 2024-04-16 MED ORDER — HYDROCODONE-ACETAMINOPHEN 5-325 MG PO TABS: 1.0000 | ORAL_TABLET | ORAL | 0 refills | Status: DC | PRN

## 2024-04-16 SURGICAL SUPPLY — 35 items
CANNULA REDUCER 12-8 DVNC XI (CANNULA) IMPLANT
CAUTERY HOOK MNPLR 1.6 DVNC XI (INSTRUMENTS) ×3 IMPLANT
CLIP LIGATING HEMO O LOK GREEN (MISCELLANEOUS) ×3 IMPLANT
DEFOGGER SCOPE WARM SEASHARP (MISCELLANEOUS) ×3 IMPLANT
DERMABOND ADVANCED .7 DNX12 (GAUZE/BANDAGES/DRESSINGS) ×3 IMPLANT
DRAPE ARM DVNC X/XI (DISPOSABLE) ×12 IMPLANT
DRAPE COLUMN DVNC XI (DISPOSABLE) ×3 IMPLANT
ELECTRODE REM PT RTRN 9FT ADLT (ELECTROSURGICAL) ×3 IMPLANT
FORCEPS BPLR R/ABLATION 8 DVNC (INSTRUMENTS) ×3 IMPLANT
FORCEPS PROGRASP DVNC XI (FORCEP) ×3 IMPLANT
GLOVE BIO SURGEON STRL SZ7 (GLOVE) ×6 IMPLANT
GOWN STRL REUS W/ TWL LRG LVL3 (GOWN DISPOSABLE) ×12 IMPLANT
IRRIGATION STRYKERFLOW (MISCELLANEOUS) IMPLANT
IV CATH ANGIO 12GX3 LT BLUE (NEEDLE) IMPLANT
KIT PINK PAD W/HEAD ARM REST (MISCELLANEOUS) ×3 IMPLANT
LABEL OR SOLS (LABEL) ×3 IMPLANT
MANIFOLD NEPTUNE II (INSTRUMENTS) ×3 IMPLANT
NDL HYPO 22X1.5 SAFETY MO (MISCELLANEOUS) ×3 IMPLANT
NEEDLE HYPO 22X1.5 SAFETY MO (MISCELLANEOUS) ×2 IMPLANT
NS IRRIG 500ML POUR BTL (IV SOLUTION) ×3 IMPLANT
OBTURATOR OPTICALSTD 8 DVNC (TROCAR) ×3 IMPLANT
PACK LAP CHOLECYSTECTOMY (MISCELLANEOUS) ×3 IMPLANT
SEAL UNIV 5-12 XI (MISCELLANEOUS) ×12 IMPLANT
SET TUBE SMOKE EVAC HIGH FLOW (TUBING) ×3 IMPLANT
SOLUTION ELECTROSURG ANTI STCK (MISCELLANEOUS) ×3 IMPLANT
SPIKE FLUID TRANSFER (MISCELLANEOUS) ×3 IMPLANT
SPONGE T-LAP 18X18 ~~LOC~~+RFID (SPONGE) ×3 IMPLANT
SPONGE T-LAP 4X18 ~~LOC~~+RFID (SPONGE) IMPLANT
SUT MNCRL AB 4-0 PS2 18 (SUTURE) ×3 IMPLANT
SUT VICRYL 0 UR6 27IN ABS (SUTURE) ×6 IMPLANT
SYR 20ML LL LF (SYRINGE) IMPLANT
SYSTEM BAG RETRIEVAL 10MM (BASKET) ×3 IMPLANT
TRAP FLUID SMOKE EVACUATOR (MISCELLANEOUS) ×3 IMPLANT
WATER STERILE IRR 3000ML UROMA (IV SOLUTION) IMPLANT
WATER STERILE IRR 500ML POUR (IV SOLUTION) ×3 IMPLANT

## 2024-04-16 NOTE — Interval H&P Note (Signed)
 History and Physical Interval Note:  04/16/2024 7:22 AM  Tanya Meza  has presented today for surgery, with the diagnosis of Biliary colic.  The various methods of treatment have been discussed with the patient and family. After consideration of risks, benefits and other options for treatment, the patient has consented to  Procedure(s) with comments: CHOLECYSTECTOMY, ROBOT-ASSISTED, LAPAROSCOPIC (N/A) - w/ICG INDOCYANINE GREEN FLUORESCENCE IMAGING (ICG) as a surgical intervention.  The patient's history has been reviewed, patient examined, no change in status, stable for surgery.  I have reviewed the patient's chart and labs.  Questions were answered to the patient's satisfaction.     Trace Wirick F Jonta Gastineau

## 2024-04-16 NOTE — Transfer of Care (Signed)
 Immediate Anesthesia Transfer of Care Note  Patient: Tanya Meza  Procedure(s) Performed: CHOLECYSTECTOMY, ROBOT-ASSISTED, LAPAROSCOPIC INDOCYANINE GREEN FLUORESCENCE IMAGING (ICG)  Patient Location: PACU  Anesthesia Type:General  Level of Consciousness: drowsy, patient cooperative, and responds to stimulation  Airway & Oxygen Therapy: Patient Spontanous Breathing and Patient connected to face mask oxygen  Post-op Assessment: Report given to RN and Post -op Vital signs reviewed and stable  Post vital signs: stable  Last Vitals:  Vitals Value Taken Time  BP    Temp    Pulse 84 04/16/24 08:35  Resp 15 04/16/24 08:35  SpO2 100 % 04/16/24 08:35  Vitals shown include unfiled device data.  Last Pain:  Vitals:   04/16/24 0627  TempSrc: Temporal  PainSc: 0-No pain         Complications: No notable events documented.

## 2024-04-16 NOTE — Anesthesia Preprocedure Evaluation (Addendum)
 Anesthesia Evaluation  Patient identified by MRN, date of birth, ID band Patient awake    Reviewed: Allergy & Precautions, H&P , NPO status , Patient's Chart, lab work & pertinent test results  Airway Mallampati: II  TM Distance: >3 FB Neck ROM: full    Dental no notable dental hx.    Pulmonary neg pulmonary ROS   Pulmonary exam normal        Cardiovascular negative cardio ROS Normal cardiovascular exam     Neuro/Psych  PSYCHIATRIC DISORDERS Anxiety     negative neurological ROS     GI/Hepatic Neg liver ROS,GERD  Medicated,,  Endo/Other  negative endocrine ROS    Renal/GU      Musculoskeletal   Abdominal  (+) + obese  Peds  Hematology negative hematology ROS (+)   Anesthesia Other Findings Past Medical History: No date: Complication of anesthesia     Comment:  ?? 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) No date: Tachycardia     Comment:  RVOF  Past Surgical History: 2005, 2008: CESAREAN SECTION     Comment:  x3 No date: CESAREAN SECTION WITH BILATERAL TUBAL LIGATION 12/22/2021: COLONOSCOPY; N/A     Comment:  Procedure: COLONOSCOPY;  Surgeon: Onita Elspeth Sharper,              DO;  Location: [facility] ENDOSCOPY;  Service:               Gastroenterology;  Laterality: N/A; 05/06/2019: DILITATION & CURRETTAGE/HYSTROSCOPY WITH HYDROTHERMAL  ABLATION; N/A     Comment:  Procedure: DILATATION & CURETTAGE/HYSTEROSCOPY WITH               HYDROTHERMAL ABLATION;  Surgeon: Fredirick Glenys RAMAN, MD;                Location: [facility];  Service:               Gynecology;  Laterality: N/A; 10/06/2021: ESOPHAGOGASTRODUODENOSCOPY; N/A     Comment:  Procedure: ESOPHAGOGASTRODUODENOSCOPY (EGD);  Surgeon:               Onita Elspeth Sharper, DO;  Location: [facility]  END[facility];                Service: Gastroenterology;  Laterality: N/A; 12/22/2021: ESOPHAGOGASTRODUODENOSCOPY; N/A     Comment:  Procedure: ESOPHAGOGASTRODUODENOSCOPY (EGD);  Surgeon:               Onita Elspeth Sharper, DO;  Location: [facility] ENDOSCOPY;                Service: Gastroenterology;  Laterality: N/A; No date: WISDOM TOOTH EXTRACTION     Reproductive/Obstetrics negative OB ROS                              Anesthesia Physical Anesthesia Plan  ASA: 2  Anesthesia Plan: General ETT   Post-op Pain Management: Tylenol  PO (pre-op)*, Celebrex  PO (pre-op)* and Gabapentin  PO (pre-op)*   Induction: Intravenous  PONV Risk Score and Plan: 2 and Ondansetron , Dexamethasone , Midazolam , Propofol  infusion and TIVA  Airway Management Planned: Oral ETT  Additional Equipment:   Intra-op Plan:   Post-operative Plan: Extubation in OR  Informed Consent: I have reviewed the patients History and Physical, chart, labs and discussed the procedure including the risks, benefits and alternatives for the proposed anesthesia with  the patient or authorized representative who has indicated his/her understanding and acceptance.     Dental Advisory Given  Plan Discussed with: CRNA and Surgeon  Anesthesia Plan Comments:          Anesthesia Quick Evaluation

## 2024-04-16 NOTE — Discharge Instructions (Addendum)

## 2024-04-16 NOTE — Op Note (Signed)
 Robotic assisted laparoscopic Cholecystectomy  Pre-operative Diagnosis: Chronic cholecystitis  Post-operative Diagnosis: same  Procedure:  Robotic assisted laparoscopic Cholecystectomy  Surgeon: Laneta Luna, MD FACS  Anesthesia: Gen. with endotracheal tube  Findings: Chronic Cholecystitis   Estimated Blood Loss:5 cc       Specimens: Gallbladder           Complications: none   Procedure Details  The patient was seen again in the Holding Room. The benefits, complications, treatment options, and expected outcomes were discussed with the patient. The risks of bleeding, infection, recurrence of symptoms, failure to resolve symptoms, bile duct damage, bile duct leak, retained common bile duct stone, bowel injury, any of which could require further surgery and/or ERCP, stent, or papillotomy were reviewed with the patient. The likelihood of improving the patient's symptoms with return to their baseline status is good.  The patient and/or family concurred with the proposed plan, giving informed consent.  The patient was taken to Operating Room, identified  and the procedure verified as Laparoscopic Cholecystectomy.  A Time Out was held and the above information confirmed.  Prior to the induction of general anesthesia, antibiotic prophylaxis was administered. VTE prophylaxis was in place. General endotracheal anesthesia was then administered and tolerated well. After the induction, the abdomen was prepped with Chloraprep and draped in the sterile fashion. The patient was positioned in the supine position.  Cut down technique was used to enter the abdominal cavity and a Hasson trochar was placed after two vicryl stitches were anchored to the fascia. Pneumoperitoneum was then created with CO2 and tolerated well without any adverse changes in the patient's vital signs.  Three 8-mm ports were placed under direct vision. All skin incisions  were infiltrated with a local anesthetic agent before making  the incision and placing the trocars.   The patient was positioned  in reverse Trendelenburg, robot was brought to the surgical field and docked in the standard fashion.  We made sure all the instrumentation was kept indirect view at all times and that there were no collision between the arms. I scrubbed out and went to the console.  The gallbladder was identified, the fundus grasped and retracted cephalad. Adhesions were lysed bluntly. The infundibulum was grasped and retracted laterally, exposing the peritoneum overlying the triangle of Calot. This was then divided and exposed in a blunt fashion. An extended critical view of the cystic duct and cystic artery was obtained.  The cystic duct was clearly identified and bluntly dissected.   Artery and duct were double clipped and divided. Using ICG cholangiography we visualize the cystic duct and cbd w/o evidence of bile injuries. The gallbladder was taken from the gallbladder fossa in a retrograde fashion with the electrocautery.  Hemostasis was achieved with the electrocautery. nspection of the right upper quadrant was performed. No bleeding, bile duct injury or leak, or bowel injury was noted. Robotic instruments and robotic arms were undocked in the standard fashion.  I scrubbed back in.  The gallbladder was removed and placed in an Endocatch bag.   Pneumoperitoneum was released.  The periumbilical port site was closed with interrumpted 0 Vicryl sutures. 4-0 subcuticular Monocryl was used to close the skin. Dermabond was  applied.  The patient was then extubated and brought to the recovery room in stable condition. Sponge, lap, and needle counts were correct at closure and at the conclusion of the case.               Laneta Luna, MD, FACS

## 2024-04-16 NOTE — Anesthesia Postprocedure Evaluation (Signed)
 Anesthesia Post Note  Patient: Tanya Meza  Procedure(s) Performed: CHOLECYSTECTOMY, ROBOT-ASSISTED, LAPAROSCOPIC INDOCYANINE GREEN FLUORESCENCE IMAGING (ICG)  Patient location during evaluation: PACU Anesthesia Type: General Level of consciousness: awake and alert Pain management: pain level controlled Vital Signs Assessment: post-procedure vital signs reviewed and stable Respiratory status: spontaneous breathing, nonlabored ventilation and respiratory function stable Cardiovascular status: blood pressure returned to baseline and stable Postop Assessment: no apparent nausea or vomiting Anesthetic complications: no   No notable events documented.   Last Vitals:  Vitals:   04/16/24 0938 04/16/24 0945  BP: 109/80 (!) 117/58  Pulse: 74 66  Resp: 10 15  Temp:  36.5 C  SpO2: 95% 100%    Last Pain:  Vitals:   04/16/24 0945  TempSrc:   PainSc: 3                  Camellia Merilee Louder

## 2024-04-16 NOTE — Anesthesia Procedure Notes (Signed)
 Procedure Name: Intubation Date/Time: 04/16/2024 7:38 AM  Performed by: Trudy Rankin LABOR, CRNAPre-anesthesia Checklist: Patient identified, Patient being monitored, Timeout performed, Emergency Drugs available and Suction available Patient Re-evaluated:Patient Re-evaluated prior to induction Oxygen Delivery Method: Circle System Utilized Preoxygenation: Pre-oxygenation with 100% oxygen Induction Type: IV induction and Rapid sequence Laryngoscope Size: Mac, 3 and McGrath Grade View: Grade I Tube type: Oral Tube size: 7.5 mm Number of attempts: 1 Airway Equipment and Method: Stylet Placement Confirmation: ETT inserted through vocal cords under direct vision, positive ETCO2 and breath sounds checked- equal and bilateral Secured at: 20 cm Tube secured with: Tape Dental Injury: Teeth and Oropharynx as per pre-operative assessment

## 2024-04-17 ENCOUNTER — Encounter: Payer: Self-pay | Admitting: Surgery

## 2024-04-17 LAB — SURGICAL PATHOLOGY

## 2024-04-20 ENCOUNTER — Ambulatory Visit: Admitting: Surgery

## 2024-04-24 ENCOUNTER — Ambulatory Visit (INDEPENDENT_AMBULATORY_CARE_PROVIDER_SITE_OTHER): Payer: Self-pay | Admitting: Physician Assistant

## 2024-04-24 VITALS — BP 120/80 | HR 65 | Resp 14 | Ht 65.0 in | Wt 194.9 lb

## 2024-04-24 DIAGNOSIS — Z0000 Encounter for general adult medical examination without abnormal findings: Secondary | ICD-10-CM

## 2024-04-24 DIAGNOSIS — K219 Gastro-esophageal reflux disease without esophagitis: Secondary | ICD-10-CM | POA: Diagnosis not present

## 2024-04-24 DIAGNOSIS — E049 Nontoxic goiter, unspecified: Secondary | ICD-10-CM | POA: Diagnosis not present

## 2024-04-24 DIAGNOSIS — Z9049 Acquired absence of other specified parts of digestive tract: Secondary | ICD-10-CM | POA: Diagnosis not present

## 2024-04-24 NOTE — Progress Notes (Signed)
 Patient agreed    Complete physical exam  Patient: Tanya Meza   DOB: 15-Jul-1980   43 y.o. Female  MRN: [id number] Visit Date: 04/24/2024  Today's healthcare provider: Jolynn Spencer, PA-C   Chief Complaint  Patient presents with   Annual Exam   Subjective    Tanya Meza is a 43 y.o. female who presents today for a complete physical exam.   Discussed the use of AI scribe software for clinical note transcription with the patient, who gave verbal consent to proceed.  History of Present Illness Tanya Meza is a 43 year old female who presents for follow-up after recent gallbladder surgery.  She underwent laparoscopic gallbladder surgery due to severe upper abdominal pain and gallstones. The surgery involved four incisions. 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  She experienced intermittent abdominal pain throughout the year, initially treated with omeprazole  for presumed acid reflux. The pain persisted, leading to further investigation and gallbladder surgery. The pain did not radiate to her throat, which is atypical for acid reflux.  She has a history of diverticulitis and a hiatal hernia, which was not addressed during the recent surgery. She experiences occasional swelling in her ankles and legs.  Her family history includes her mother's hypothyroidism. She is concerned about thyroid issues, recalling a previous normal TSH test but no thyroid ultrasound.  She is currently taking omeprazole  for acid reflux and occasionally uses Miralax  for bowel regularity, with bowel movements every three to four days. She reports no current abdominal pain, significant pain, or bloating.    Last depression screening scores    04/24/2023    8:18 AM 04/19/2023    9:32 AM 11/17/2021    2:01 PM  PHQ 2/9 Scores  PHQ - 2 Score 0 0 0  PHQ- 9 Score 0  0  0      Data saved with a previous flowsheet row definition   Last  fall risk screening    11/17/2021    2:01 PM  Fall Risk   Falls in the past year? 0  Number falls in past yr: 0  Injury with Fall? 0  Risk for fall due to : No Fall Risks   Last Audit-C alcohol use screening    04/24/2024    8:24 AM  Alcohol Use Disorder Test (AUDIT)  1. How often do you have a drink containing alcohol? 1  2. How many drinks containing alcohol do you have on a typical day when you are drinking? 0  3. How often do you have six or more drinks on one occasion? 0  AUDIT-C Score 1      Patient-reported   A score of 3 or more in women, and 4 or more in men indicates increased risk for alcohol abuse, EXCEPT if all of the points are from question 1   Past Medical History:  Diagnosis Date   Complication of anesthesia    ?? bradycardia during dental surgery   GERD (gastroesophageal reflux disease)    Nephrolithiasis    PCOS (polycystic ovarian syndrome)    RVOT ventricular tachycardia (HCC)    Tachycardia    RVOF   Past Surgical History:  Procedure Laterality Date   CESAREAN SECTION  2005, 2008   x3   CHOLECYSTECTOMY  04/16/2024   COLONOSCOPY N/A 12/22/2021   Procedure: COLONOSCOPY;  Surgeon: Onita Elspeth Sharper, DO;  Location: [facility] ENDOSCOPY;  Service: Gastroenterology;  Laterality: N/A;  DILITATION & CURRETTAGE/HYSTROSCOPY WITH HYDROTHERMAL ABLATION N/A 05/06/2019   Procedure: DILATATION & CURETTAGE/HYSTEROSCOPY WITH HYDROTHERMAL ABLATION;  Surgeon: Fredirick Glenys RAMAN, MD;  Location: [facility];  Service: Gynecology;  Laterality: N/A;   ESOPHAGOGASTRODUODENOSCOPY N/A 10/06/2021   Procedure: ESOPHAGOGASTRODUODENOSCOPY (EGD);  Surgeon: Onita Elspeth Sharper, DO;  Location: [facility] ENDOSCOPY;  Service: Gastroenterology;  Laterality: N/A;   ESOPHAGOGASTRODUODENOSCOPY N/A 12/22/2021   Procedure: ESOPHAGOGASTRODUODENOSCOPY (EGD);  Surgeon: Onita Elspeth Sharper, DO;  Location: [facility] ENDOSCOPY;  Service: Gastroenterology;  Laterality: N/A;   INDOCYANINE GREEN  FLUORESCENCE IMAGING (ICG)  04/16/2024   Procedure: INDOCYANINE GREEN FLUORESCENCE IMAGING (ICG);  Surgeon: Jordis Laneta FALCON, MD;  Location: [facility] ORS;  Service: General;;   TUBAL LIGATION  2012   WISDOM TOOTH EXTRACTION     Social History   Socioeconomic History   Marital status: Married    Spouse name: Not on file   Number of children: 3   Years of education: Not on file   Highest education level: Associate degree: occupational, scientist, product/process development, or vocational program  Occupational History    Employer: [employer]  Tobacco Use   Smoking status: Never   Smokeless tobacco: Never  Vaping Use   Vaping status: Never Used  Substance and Sexual Activity   Alcohol use: Not Currently    Comment: social   Drug use: No   Sexual activity: Yes    Partners: Male    Birth control/protection: Surgical    Comment: tubal ligation.  Other Topics Concern   Not on file  Social History Narrative   Not on file   Social Drivers of Health   Financial Resource Strain: Medium Risk (04/24/2024)   Overall Financial Resource Strain (CARDIA)    Difficulty of Paying Living Expenses: Somewhat hard  Food Insecurity: No Food Insecurity (04/24/2024)   Hunger Vital Sign    Worried About Running Out of Food in the Last Year: Never true    Ran Out of Food in the Last Year: Never true  Transportation Needs: No Transportation Needs (04/24/2024)   PRAPARE - Administrator, Civil Service (Medical): No    Lack of Transportation (Non-Medical): No  Physical Activity: Insufficiently Active (04/24/2024)   Exercise Vital Sign    Days of Exercise per Week: 1 day    Minutes of Exercise per Session: 10 min  Stress: No Stress Concern Present (04/24/2024)   [employer] of Occupational Health - Occupational Stress Questionnaire    Feeling of Stress: Only a little  Social Connections: Moderately Integrated (04/24/2024)   Social Connection and Isolation Panel    Frequency of Communication with Friends and Family: More  than three times a week    Frequency of Social Gatherings with Friends and Family: Twice a week    Attends Religious Services: More than 4 times per year    Active Member of [employer] or Organizations: No    Attends Banker Meetings: Not on file    Marital Status: Married  Catering Manager Violence: Not At Risk (12/30/2018)   Humiliation, Afraid, Rape, and Kick questionnaire    Fear of Current or Ex-Partner: No    Emotionally Abused: No    Physically Abused: No    Sexually Abused: No   Family Status  Relation Name Status   Mother Mom Alive   Father Dad Alive   Brother 1 Alive   Brother 2 Alive   Brother 3 Alive   Daughter  Estate Manager/land Agent   Son  Alive  Mat Aunt  (Not Specified)   MGF Gpa Deceased   PGF Gpa Deceased   Neg Hx  (Not Specified)  No partnership data on file   Family History  Problem Relation Age of Onset   Thyroid disease Mother    Hyperlipidemia Mother    Hypothyroidism Mother    Tongue cancer Mother    Cancer Mother    Diabetes Father    Hyperlipidemia Father    Cancer Father    Hypertension Brother    Arthritis/Rheumatoid Brother    Hyperlipidemia Brother    Breast cancer Maternal Aunt    Heart attack Maternal Grandfather    Stroke Maternal Grandfather    Alcohol abuse Paternal Grandfather    Heart attack Paternal Grandfather    Anesthesia problems Neg Hx    Colon cancer Neg Hx    Allergies  Allergen Reactions   Latex Swelling    Swelling- around catheter    Patient Care Team: Sharra Cayabyab, PA-C as PCP - General (Physician Assistant) Narvis Fees, NP as Nurse Practitioner (Family Medicine)   Medications: Outpatient Medications Prior to Visit  Medication Sig   HYDROcodone-acetaminophen  (NORCO/VICODIN) 5-325 MG tablet Take 1-2 tablets by mouth every 4 (four) hours as needed for moderate pain (pain score 4-6).   omeprazole  (PRILOSEC) 40 MG capsule TAKE 1 CAPSULE BY MOUTH EVERY DAY   [DISCONTINUED] ondansetron   (ZOFRAN -ODT) 4 MG disintegrating tablet Take 1 tablet (4 mg total) by mouth every 6 (six) hours as needed for nausea or vomiting. (Patient not taking: Reported on 04/14/2024)   No facility-administered medications prior to visit.    Review of Systems  All other systems reviewed and are negative.  Except see HPI     Objective    BP 120/80   Pulse 65   Resp 14   Ht 5' 5 (1.651 m)   Wt 194 lb 14.4 oz (88.4 kg)   LMP 03/24/2024 (Approximate)   SpO2 99%   BMI 32.43 kg/m      Physical Exam Vitals reviewed.  Constitutional:      General: She is not in acute distress.    Appearance: Normal appearance. She is well-developed. She is obese. She is not ill-appearing, toxic-appearing or diaphoretic.  HENT:     Head: Normocephalic and atraumatic.     Right Ear: Tympanic membrane, ear canal and external ear normal.     Left Ear: Tympanic membrane, ear canal and external ear normal.     Nose: Nose normal. No congestion or rhinorrhea.     Mouth/Throat:     Mouth: Mucous membranes are moist.     Pharynx: Oropharynx is clear. No oropharyngeal exudate.  Eyes:     General: No scleral icterus.       Right eye: No discharge.        Left eye: No discharge.     Conjunctiva/sclera: Conjunctivae normal.     Pupils: Pupils are equal, round, and reactive to light.  Neck:     Thyroid: No thyromegaly.     Vascular: No carotid bruit.  Cardiovascular:     Rate and Rhythm: Normal rate and regular rhythm.     Pulses: Normal pulses.     Heart sounds: Normal heart sounds. No murmur heard.    No friction rub. No gallop.  Pulmonary:     Effort: Pulmonary effort is normal. No respiratory distress.     Breath sounds: Normal breath sounds. No wheezing or rales.  Abdominal:     General: Abdomen is  flat. Bowel sounds are normal. There is no distension.     Palpations: Abdomen is soft. There is no mass.     Tenderness: There is no abdominal tenderness. There is no right CVA tenderness, left CVA  tenderness, guarding or rebound.     Hernia: No hernia is present.  Musculoskeletal:        General: No swelling, tenderness, deformity or signs of injury. Normal range of motion.     Cervical back: Normal range of motion and neck supple. No rigidity or tenderness.     Right lower leg: No edema.     Left lower leg: No edema.  Lymphadenopathy:     Cervical: No cervical adenopathy.  Skin:    General: Skin is warm and dry.     Coloration: Skin is not jaundiced or pale.     Findings: No bruising, erythema, lesion or rash.  Neurological:     Mental Status: She is alert and oriented to person, place, and time. Mental status is at baseline.     Gait: Gait normal.  Psychiatric:        Mood and Affect: Mood normal.        Behavior: Behavior normal.        Thought Content: Thought content normal.        Judgment: Judgment normal.      No results found for any visits on 04/24/24.  Assessment & Plan    Routine Health Maintenance and Physical Exam  Exercise Activities and Dietary recommendations  Goals      Exercise 150 minutes per week (moderate activity)        Immunization History  Administered Date(s) Administered   DTaP 12/10/1978, 02/14/1979, 04/14/1979, 07/24/1980, 10/16/1983   Dtap, Unspecified 12/10/1978, 02/14/1979, 04/14/1979, 07/24/1980, 10/16/1983   Hepatitis B 08/09/1996, 03/09/1997, 04/08/1997   IPV 01/21/1979, 03/17/1979, 05/26/1979   Influenza Split 04/24/2011, 08/05/2012   Influenza,inj,Quad PF,6+ Mos 04/02/2017, 04/02/2018, 03/04/2019   MMR 02/17/1980   PPD Test 07/23/2011   Td 03/09/1997, 05/16/2011, 11/17/2021   Tdap 05/16/2011    Health Maintenance  Topic Date Due   Hepatitis B Vaccine (4 of 4 - 4-dose series) 05/04/1997   HPV Vaccine (1 - 3-dose SCDM series) Never done   COVID-19 Vaccine (1 - 2025-26 season) Never done   Flu Shot  09/15/2024*   Breast Cancer Screening  02/27/2026   Pap with HPV screening  11/23/2026   DTaP/Tdap/Td vaccine (10 - Td  or Tdap) 11/18/2031   Colon Cancer Screening  12/23/2031   Hepatitis C Screening  Completed   HIV Screening  Completed   Pneumococcal Vaccine  Aged Out   Meningitis B Vaccine  Aged Out  *Topic was postponed. The date shown is not the original due date.    Discussed health benefits of physical activity, and encouraged her to engage in regular exercise appropriate for her age and condition.  Assessment & Plan Annual physical exam Adult Wellness Visit Routine wellness visit with emphasis on healthy eating, hydration, and regular bowel movements. - Encouraged healthy eating and hydration. - Advised on regular bowel movements.  Things to do to keep yourself healthy  - Exercise at least 30-45 minutes a day, 3-4 days a week.  - Eat a low-fat diet with lots of fruits and vegetables, up to 7-9 servings per day.  - Seatbelts can save your life. Wear them always.  - Smoke detectors on every level of your home, check batteries every year.  - Eye Doctor - have  an eye exam every 1-2 years  - Safe sex - if you may be exposed to STDs, use a condom.  - Alcohol -  If you drink, do it moderately, less than 2 drinks per day.  - Health Care Power of Attorney. Choose someone to speak for you if you are not able.  - Depression is common in our stressful world.If you're feeling down or losing interest in things you normally enjoy, please come in for a visit.  - Violence - If anyone is threatening or hurting you, please call immediately.  Status post cholecystectomy for cholelithiasis Post-operative status following laparoscopic cholecystectomy for cholelithiasis. Mild soreness present, but overall recovery is progressing. - Avoid lifting over 10 pounds for a month. - Communicate with surgeon regarding work restrictions. - Attend follow-up appointment with PA on Wednesday afternoon.  Venous insufficiency of lower extremities Intermittent swelling in ankles and legs, likely due to venous insufficiency,  exacerbated by prolonged standing at work. - Wear compression stockings during work hours.  Nontoxic goiter, unspecified Suspected nontoxic goiter with visual enlargement of the thyroid gland. Previous TSH levels were normal, but further evaluation is warranted. - Schedule follow-up in 10 weeks for thyroid evaluation. - Consider thyroid ultrasound if indicated.  Gastroesophageal reflux disease (GERD) GERD managed with omeprazole . Recent severe abdominal pain initially thought to be GERD, later diagnosed as cholelithiasis. - Continue omeprazole  for GERD management.  Elevate the head of the bed 6-8 inches, avoid recumbency for 3 hours after eating, avoid food as a delayed gastric emptying, weight loss     Status post cholecystectomy  - CBC w/Diff/Platelet - Comprehensive metabolic panel with GFR - C-reactive protein - Procalcitonin  No follow-ups on file. Will do labwork at the follow-up    The patient was advised to call back or seek an in-person evaluation if the symptoms worsen or if the condition fails to improve as anticipated.  I discussed the assessment and treatment plan with the patient. The patient was provided an opportunity to ask questions and all were answered. The patient agreed with the plan and demonstrated an understanding of the instructions.  I, Claudene Gatliff, PA-C have reviewed all documentation for this visit. The documentation on 04/24/2024  for the exam, diagnosis, procedures, and orders are all accurate and complete.  Jolynn Spencer, [facility], MMS [facility] [phone] (phone) [phone] (fax)  [facility]

## 2024-04-26 ENCOUNTER — Encounter: Payer: Self-pay | Admitting: Physician Assistant

## 2024-04-29 ENCOUNTER — Encounter: Payer: Self-pay | Admitting: Physician Assistant

## 2024-04-29 ENCOUNTER — Ambulatory Visit (INDEPENDENT_AMBULATORY_CARE_PROVIDER_SITE_OTHER): Admitting: Physician Assistant

## 2024-04-29 VITALS — BP 116/75 | HR 73 | Temp 99.1°F | Ht 65.0 in | Wt 195.6 lb

## 2024-04-29 DIAGNOSIS — Z09 Encounter for follow-up examination after completed treatment for conditions other than malignant neoplasm: Secondary | ICD-10-CM

## 2024-04-29 DIAGNOSIS — K8020 Calculus of gallbladder without cholecystitis without obstruction: Secondary | ICD-10-CM

## 2024-04-29 DIAGNOSIS — K811 Chronic cholecystitis: Secondary | ICD-10-CM

## 2024-04-29 NOTE — Progress Notes (Signed)
 [name] POST-OP OFFICE VISIT  04/29/2024  HPI: Tanya Meza is a 43 y.o. female 13 days s/p robotic assisted laparoscopic cholecystectomy for chronic cholecystitis with Dr Jordis  She reports doing much better Minimal abdominal soreness No fever, chills, nausea, emesis She is tolerating PO; no diarrhea; baseline more constipated Incisions are healing well No other complaints   Briefly, also followed for small HH, underwent barium swallow on 10/17. She is on PPI (omeprazole ). She reports she has not had any further similar attacks since surgery.   Vital signs: BP 116/75   Pulse 73   Temp 99.1 F (37.3 C) (Oral)   Ht 5' 5 (1.651 m)   Wt 195 lb 9.6 oz (88.7 kg)   LMP 03/24/2024 (Approximate)   SpO2 96%   BMI 32.55 kg/m    Physical Exam: Constitutional: Well appearing female, NAD Abdomen: Soft, non-tender, non-distended, no rebound/guarding Skin: Laparoscopic incisions are healing well, no erythema or drainage   Assessment/Plan: This is a 43 y.o. female 13 days s/p robotic assisted laparoscopic cholecystectomy for chronic cholecystitis with Dr Jordis   - Pain control prn  - Reviewed wound care recommendation  - Reviewed lifting restrictions; 4 weeks total  - Reviewed surgical pathology; CCC  - From a hiatal hernia standpoint, reflux seems to be controlled with PPI, no further episodes of similar pain. She will monitor this and call back if symptoms recur/worsen. I do not think this needs surgical correction at this time.   - She can otherwise follow up on as needed basis; She understands to call with questions/concerns  -- Tanya Platt, PA-C [facility] 04/29/2024, 2:36 PM M-F: 7am - 4pm

## 2024-04-29 NOTE — Patient Instructions (Signed)

## 2024-07-10 ENCOUNTER — Ambulatory Visit: Admitting: Physician Assistant

## 2024-07-10 ENCOUNTER — Encounter: Payer: Self-pay | Admitting: Physician Assistant

## 2024-07-10 VITALS — BP 104/49 | HR 73 | Wt 206.7 lb

## 2024-07-10 DIAGNOSIS — R7303 Prediabetes: Secondary | ICD-10-CM | POA: Diagnosis not present

## 2024-07-10 DIAGNOSIS — E66811 Obesity, class 1: Secondary | ICD-10-CM | POA: Diagnosis not present

## 2024-07-10 DIAGNOSIS — E7800 Pure hypercholesterolemia, unspecified: Secondary | ICD-10-CM

## 2024-07-10 DIAGNOSIS — E049 Nontoxic goiter, unspecified: Secondary | ICD-10-CM

## 2024-07-10 DIAGNOSIS — I872 Venous insufficiency (chronic) (peripheral): Secondary | ICD-10-CM

## 2024-07-10 DIAGNOSIS — K219 Gastro-esophageal reflux disease without esophagitis: Secondary | ICD-10-CM

## 2024-07-10 DIAGNOSIS — G5603 Carpal tunnel syndrome, bilateral upper limbs: Secondary | ICD-10-CM

## 2024-07-10 DIAGNOSIS — Z9049 Acquired absence of other specified parts of digestive tract: Secondary | ICD-10-CM

## 2024-07-10 DIAGNOSIS — Z8349 Family history of other endocrine, nutritional and metabolic diseases: Secondary | ICD-10-CM | POA: Diagnosis not present

## 2024-07-10 NOTE — Progress Notes (Unsigned)
 " Established patient visit  Patient: Tanya Meza   DOB: 21-May-1981   44 y.o. Female  MRN: [id number] Visit Date: 07/10/2024  Today's healthcare provider: Jolynn Spencer, PA-C   Chief Complaint  Patient presents with   Follow-up   Subjective       Discussed the use of AI scribe software for clinical note transcription with the patient, who gave verbal consent to proceed.  History of Present Illness        07/10/2024    9:27 AM 04/24/2024   10:19 AM 04/24/2023    8:18 AM  Depression screen PHQ 2/9  Decreased Interest 0 0 0  Down, Depressed, Hopeless 0 0 0  PHQ - 2 Score 0 0 0  Altered sleeping   0  Tired, decreased energy   0  Change in appetite   0  Feeling bad or failure about yourself    0  Trouble concentrating   0  Moving slowly or fidgety/restless   0  Suicidal thoughts   0  PHQ-9 Score   0   Difficult doing work/chores   Not difficult at all     Data saved with a previous flowsheet row definition      07/10/2024    9:27 AM 04/24/2023    8:18 AM 04/19/2023    9:32 AM 08/24/2016   10:20 AM  GAD 7 : Generalized Anxiety Score  Nervous, Anxious, on Edge 0 0  0  1   Control/stop worrying 0 0  0  0   Worry too much - different things 0 0  0  1   Trouble relaxing 0 0  0  0   Restless 0 0  0  0   Easily annoyed or irritable 0 0  0  0   Afraid - awful might happen 0 0  0  0   Total GAD 7 Score 0 0 0 2  Anxiety Difficulty Not difficult at all Not difficult at all Not difficult at all Not difficult at all     Data saved with a previous flowsheet row definition    Medications: Show/hide medication list[1]  Review of Systems All negative Except see HPI   {Insert previous labs (optional):23779} {See past labs  Heme  Chem  Endocrine  Serology  Results Review (optional):1}   Objective    BP (!) 104/49 (BP Location: Left Arm, Patient Position: Sitting, Cuff Size: Large)   Pulse 73   Wt 206 lb 11.2 oz (93.8 kg)   SpO2 99%   BMI 34.40 kg/m  {Insert  last BP/Wt (optional):23777}{See vitals history (optional):1}   Physical Exam   No results found for any visits on 07/10/24.      Assessment and Plan Assessment & Plan     Orders Placed This Encounter  Procedures   TSH + free T4    No follow-ups on file.   The patient was advised to call back or seek an in-person evaluation if the symptoms worsen or if the condition fails to improve as anticipated.  I discussed the assessment and treatment plan with the patient. The patient was provided an opportunity to ask questions and all were answered. The patient agreed with the plan and demonstrated an understanding of the instructions.  I, Shahara Hartsfield, PA-C have reviewed all documentation for this visit. The documentation on 07/10/2024  for the exam, diagnosis, procedures, and orders are all accurate and complete.  Jolynn Spencer, [facility], MMS [facility] [phone] (phone) [phone] (  fax)  [facility]      [1] Outpatient Medications Prior to Visit  Medication Sig   omeprazole  (PRILOSEC) 40 MG capsule TAKE 1 CAPSULE BY MOUTH EVERY DAY   No facility-administered medications prior to visit.  "

## 2024-07-11 DIAGNOSIS — E66811 Obesity, class 1: Secondary | ICD-10-CM | POA: Insufficient documentation

## 2024-07-11 DIAGNOSIS — G5603 Carpal tunnel syndrome, bilateral upper limbs: Secondary | ICD-10-CM | POA: Insufficient documentation

## 2024-07-11 DIAGNOSIS — E049 Nontoxic goiter, unspecified: Secondary | ICD-10-CM | POA: Insufficient documentation

## 2024-07-11 DIAGNOSIS — I872 Venous insufficiency (chronic) (peripheral): Secondary | ICD-10-CM | POA: Insufficient documentation

## 2024-07-11 DIAGNOSIS — E7800 Pure hypercholesterolemia, unspecified: Secondary | ICD-10-CM | POA: Insufficient documentation

## 2024-07-11 DIAGNOSIS — R7303 Prediabetes: Secondary | ICD-10-CM | POA: Insufficient documentation

## 2024-07-11 LAB — COMPREHENSIVE METABOLIC PANEL WITH GFR
ALT: 22 [IU]/L (ref 0–32)
AST: 19 [IU]/L (ref 0–40)
Albumin: 4.5 g/dL (ref 3.9–4.9)
Alkaline Phosphatase: 90 [IU]/L (ref 41–116)
BUN/Creatinine Ratio: 20 (ref 9–23)
BUN: 16 mg/dL (ref 6–24)
Bilirubin Total: 0.3 mg/dL (ref 0.0–1.2)
CO2: 24 mmol/L (ref 20–29)
Calcium: 9.5 mg/dL (ref 8.7–10.2)
Chloride: 102 mmol/L (ref 96–106)
Creatinine, Ser: 0.81 mg/dL (ref 0.57–1.00)
Globulin, Total: 2.7 g/dL (ref 1.5–4.5)
Glucose: 91 mg/dL (ref 70–99)
Potassium: 4.6 mmol/L (ref 3.5–5.2)
Sodium: 139 mmol/L (ref 134–144)
Total Protein: 7.2 g/dL (ref 6.0–8.5)
eGFR: 91 mL/min/{1.73_m2}

## 2024-07-11 LAB — CBC WITH DIFFERENTIAL/PLATELET
Basophils Absolute: 0.1 10*3/uL (ref 0.0–0.2)
Basos: 1 %
EOS (ABSOLUTE): 0.3 10*3/uL (ref 0.0–0.4)
Eos: 4 %
Hematocrit: 43.4 % (ref 34.0–46.6)
Hemoglobin: 13.9 g/dL (ref 11.1–15.9)
Immature Grans (Abs): 0 10*3/uL (ref 0.0–0.1)
Immature Granulocytes: 0 %
Lymphocytes Absolute: 2 10*3/uL (ref 0.7–3.1)
Lymphs: 30 %
MCH: 28 pg (ref 26.6–33.0)
MCHC: 32 g/dL (ref 31.5–35.7)
MCV: 88 fL (ref 79–97)
Monocytes Absolute: 0.5 10*3/uL (ref 0.1–0.9)
Monocytes: 7 %
Neutrophils Absolute: 3.9 10*3/uL (ref 1.4–7.0)
Neutrophils: 58 %
Platelets: 424 10*3/uL (ref 150–450)
RBC: 4.96 x10E6/uL (ref 3.77–5.28)
RDW: 12.9 % (ref 11.7–15.4)
WBC: 6.7 10*3/uL (ref 3.4–10.8)

## 2024-07-11 LAB — TSH+FREE T4
Free T4: 1.12 ng/dL (ref 0.82–1.77)
TSH: 1.6 u[IU]/mL (ref 0.450–4.500)

## 2024-07-11 LAB — LIPID PANEL
Chol/HDL Ratio: 4.2 ratio (ref 0.0–4.4)
Cholesterol, Total: 218 mg/dL — ABNORMAL HIGH (ref 100–199)
HDL: 52 mg/dL
LDL Chol Calc (NIH): 143 mg/dL — ABNORMAL HIGH (ref 0–99)
Triglycerides: 131 mg/dL (ref 0–149)
VLDL Cholesterol Cal: 23 mg/dL (ref 5–40)

## 2024-07-11 LAB — HEMOGLOBIN A1C
Est. average glucose Bld gHb Est-mCnc: 117 mg/dL
Hgb A1c MFr Bld: 5.7 % — ABNORMAL HIGH (ref 4.8–5.6)

## 2024-07-12 DIAGNOSIS — Z9049 Acquired absence of other specified parts of digestive tract: Secondary | ICD-10-CM | POA: Insufficient documentation

## 2024-07-13 ENCOUNTER — Ambulatory Visit: Payer: Self-pay | Admitting: Physician Assistant

## 2024-07-22 ENCOUNTER — Telehealth: Payer: Self-pay

## 2024-07-22 ENCOUNTER — Other Ambulatory Visit: Payer: Self-pay | Admitting: Physician Assistant

## 2024-07-22 ENCOUNTER — Ambulatory Visit

## 2024-07-22 DIAGNOSIS — R7303 Prediabetes: Secondary | ICD-10-CM

## 2024-07-22 DIAGNOSIS — E66811 Obesity, class 1: Secondary | ICD-10-CM

## 2024-07-22 NOTE — Progress Notes (Unsigned)
 Care Guide Pharmacy Note  07/22/2024 Name: GABI MCFATE MRN: [id number] DOB: [birth year]  Referred By: Ostwalt, Janna, PA-C Reason for referral: Complex Care Management and Call Attempt #1 (Unsuccessful initial outreach to schedule with PHARM D- Allyson)   Chrys YURITZY ZEHRING is a 44 y.o. year old female who is a primary care patient of Ostwalt, Janna, PA-C.  Kyndahl ARASELI SHERRY was referred to the pharmacist for assistance related to: Care Guide Pharmacy Note  07/22/2024 Name: LURAE HORNBROOK MRN: [id number] DOB: 1981-04-17  Referred By: Ostwalt, Janna, PA-C Reason for referral: Complex Care Management and Call Attempt #1 (Unsuccessful initial outreach to schedule with PHARM D- Allyson)   Aeliana ELMYRA BANWART is a 44 y.o. year old female who is a primary care patient of Ostwalt, Janna, PA-C.  Mylie CONNELLY SPRUELL was referred to the pharmacist for assistance related to: Prediabetes  An unsuccessful telephone outreach was attempted today to contact the patient who was referred to the pharmacy team for assistance with Weight loss. Additional attempts will be made to contact the patient.  Leotis Rase [facility], [facility] Guide  Direct Dial: [phone]  Fax [phone]

## 2024-07-23 NOTE — Progress Notes (Signed)
 Care Guide Pharmacy Note  07/23/2024 Name: Tanya Meza MRN: [id number] DOB: 15-Jun-1981  Referred By: Ostwalt, Janna, PA-C Reason for referral: Complex Care Management, Call Attempt #1 (Unsuccessful initial outreach to schedule with PHARM D- Allyson), and Call Attempt #2 (Successful initial outreach scheduled with PHARM D- Allyson)   Tanya Meza is a 44 y.o. year old female who is a primary care patient of Ostwalt, Janna, PA-C.  Tanya Meza was referred to the pharmacist for assistance related to: prediabetes  Successful contact was made with the patient to discuss pharmacy services including being ready for the pharmacist to call at least 5 minutes before the scheduled appointment time and to have medication bottles and any blood pressure readings ready for review. The patient agreed to meet with the pharmacist via In office 08/06/24 @ 10 AM on (date/time).  Leotis Rase [facility], [facility] Guide  Direct Dial: [phone]  Fax [phone]

## 2024-07-31 ENCOUNTER — Ambulatory Visit

## 2024-08-06 ENCOUNTER — Ambulatory Visit

## 2025-04-30 ENCOUNTER — Encounter: Admitting: Physician Assistant
